# Patient Record
Sex: Male | Born: 1935 | Race: Black or African American | Hispanic: No | Marital: Married | Smoking: Never smoker
Health system: Southern US, Community
[De-identification: ages and names within clinical notes are randomized; demographics above are authoritative.]

## PROBLEM LIST (undated history)

## (undated) DIAGNOSIS — J90 Pleural effusion, not elsewhere classified: Principal | ICD-10-CM

## (undated) DIAGNOSIS — I5022 Chronic systolic (congestive) heart failure: Secondary | ICD-10-CM

## (undated) DIAGNOSIS — R269 Unspecified abnormalities of gait and mobility: Secondary | ICD-10-CM

## (undated) DIAGNOSIS — R0602 Shortness of breath: Secondary | ICD-10-CM

## (undated) DIAGNOSIS — I493 Ventricular premature depolarization: Secondary | ICD-10-CM

## (undated) DIAGNOSIS — N183 Chronic kidney disease, stage 3 unspecified: Secondary | ICD-10-CM

## (undated) DIAGNOSIS — I4892 Unspecified atrial flutter: Secondary | ICD-10-CM

## (undated) DIAGNOSIS — I4891 Unspecified atrial fibrillation: Secondary | ICD-10-CM

## (undated) DIAGNOSIS — I5021 Acute systolic (congestive) heart failure: Secondary | ICD-10-CM

## (undated) DIAGNOSIS — J9600 Acute respiratory failure, unspecified whether with hypoxia or hypercapnia: Secondary | ICD-10-CM

## (undated) DIAGNOSIS — C8590 Non-Hodgkin lymphoma, unspecified, unspecified site: Secondary | ICD-10-CM

## (undated) DIAGNOSIS — I2510 Atherosclerotic heart disease of native coronary artery without angina pectoris: Secondary | ICD-10-CM

## (undated) DIAGNOSIS — I255 Ischemic cardiomyopathy: Secondary | ICD-10-CM

## (undated) DIAGNOSIS — I351 Nonrheumatic aortic (valve) insufficiency: Secondary | ICD-10-CM

## (undated) DIAGNOSIS — N1330 Unspecified hydronephrosis: Secondary | ICD-10-CM

## (undated) DIAGNOSIS — I340 Nonrheumatic mitral (valve) insufficiency: Secondary | ICD-10-CM

## (undated) DIAGNOSIS — J189 Pneumonia, unspecified organism: Secondary | ICD-10-CM

## (undated) DIAGNOSIS — I350 Nonrheumatic aortic (valve) stenosis: Secondary | ICD-10-CM

## (undated) DIAGNOSIS — J869 Pyothorax without fistula: Secondary | ICD-10-CM

## (undated) DIAGNOSIS — N400 Enlarged prostate without lower urinary tract symptoms: Secondary | ICD-10-CM

## (undated) DIAGNOSIS — E43 Unspecified severe protein-calorie malnutrition: Secondary | ICD-10-CM

## (undated) DIAGNOSIS — I471 Supraventricular tachycardia, unspecified: Secondary | ICD-10-CM

## (undated) DIAGNOSIS — D649 Anemia, unspecified: Secondary | ICD-10-CM

## (undated) DIAGNOSIS — M109 Gout, unspecified: Secondary | ICD-10-CM

## (undated) HISTORY — DX: Chronic kidney disease, stage 3 (moderate): N18.3

## (undated) HISTORY — PX: COLONOSCOPY W/ BIOPSIES AND POLYPECTOMY: SHX1376

## (undated) HISTORY — DX: Non-Hodgkin lymphoma, unspecified, unspecified site: C85.90

## (undated) HISTORY — DX: Acute systolic (congestive) heart failure: I50.21

## (undated) HISTORY — DX: Unspecified severe protein-calorie malnutrition: E43

## (undated) HISTORY — DX: Anemia, unspecified: D64.9

## (undated) HISTORY — DX: Pneumonia, unspecified organism: J18.9

## (undated) HISTORY — PX: TONSILLECTOMY: SUR1361

## (undated) HISTORY — DX: Chronic kidney disease, stage 3 unspecified: N18.30

## (undated) HISTORY — DX: Pleural effusion, not elsewhere classified: J90

## (undated) HISTORY — DX: Atherosclerotic heart disease of native coronary artery without angina pectoris: I25.10

## (undated) HISTORY — DX: Nonrheumatic aortic (valve) insufficiency: I35.1

## (undated) HISTORY — DX: Unspecified atrial fibrillation: I48.91

## (undated) HISTORY — PX: LYMPH NODE DISSECTION: SHX5087

## (undated) HISTORY — DX: Pyothorax without fistula: J86.9

## (undated) HISTORY — DX: Acute respiratory failure, unspecified whether with hypoxia or hypercapnia: J96.00

## (undated) HISTORY — DX: Unspecified atrial flutter: I48.92

## (undated) HISTORY — DX: Chronic systolic (congestive) heart failure: I50.22

## (undated) HISTORY — PX: INGUINAL HERNIA REPAIR: SUR1180

## (undated) HISTORY — DX: Gout, unspecified: M10.9

## (undated) HISTORY — DX: Ischemic cardiomyopathy: I25.5

## (undated) HISTORY — DX: Ventricular premature depolarization: I49.3

---

## 2000-10-30 ENCOUNTER — Encounter (INDEPENDENT_AMBULATORY_CARE_PROVIDER_SITE_OTHER): Payer: Self-pay | Admitting: *Deleted

## 2000-10-30 ENCOUNTER — Encounter (INDEPENDENT_AMBULATORY_CARE_PROVIDER_SITE_OTHER): Payer: Self-pay | Admitting: Specialist

## 2000-10-31 ENCOUNTER — Encounter: Payer: Self-pay | Admitting: Internal Medicine

## 2000-10-31 ENCOUNTER — Inpatient Hospital Stay (HOSPITAL_COMMUNITY): Admission: EM | Admit: 2000-10-31 | Discharge: 2000-11-03 | Payer: Self-pay | Admitting: Emergency Medicine

## 2000-10-31 ENCOUNTER — Encounter: Payer: Self-pay | Admitting: *Deleted

## 2000-11-01 ENCOUNTER — Encounter: Payer: Self-pay | Admitting: Internal Medicine

## 2000-11-02 ENCOUNTER — Encounter: Payer: Self-pay | Admitting: Internal Medicine

## 2000-11-11 ENCOUNTER — Emergency Department (HOSPITAL_COMMUNITY): Admission: EM | Admit: 2000-11-11 | Discharge: 2000-11-11 | Payer: Self-pay | Admitting: Internal Medicine

## 2000-11-17 ENCOUNTER — Encounter: Payer: Self-pay | Admitting: Neurosurgery

## 2000-11-21 ENCOUNTER — Inpatient Hospital Stay (HOSPITAL_COMMUNITY): Admission: RE | Admit: 2000-11-21 | Discharge: 2000-11-23 | Payer: Self-pay | Admitting: Neurosurgery

## 2000-11-21 ENCOUNTER — Encounter: Payer: Self-pay | Admitting: Neurosurgery

## 2000-11-21 ENCOUNTER — Encounter (INDEPENDENT_AMBULATORY_CARE_PROVIDER_SITE_OTHER): Payer: Self-pay | Admitting: *Deleted

## 2000-12-27 ENCOUNTER — Encounter: Payer: Self-pay | Admitting: Hematology & Oncology

## 2000-12-27 ENCOUNTER — Ambulatory Visit (HOSPITAL_COMMUNITY): Admission: RE | Admit: 2000-12-27 | Discharge: 2000-12-27 | Payer: Self-pay | Admitting: Hematology & Oncology

## 2002-01-21 ENCOUNTER — Encounter: Payer: Self-pay | Admitting: Emergency Medicine

## 2002-01-22 ENCOUNTER — Encounter: Payer: Self-pay | Admitting: Internal Medicine

## 2002-01-22 ENCOUNTER — Inpatient Hospital Stay (HOSPITAL_COMMUNITY): Admission: EM | Admit: 2002-01-22 | Discharge: 2002-01-25 | Payer: Self-pay | Admitting: Emergency Medicine

## 2002-01-22 ENCOUNTER — Encounter (INDEPENDENT_AMBULATORY_CARE_PROVIDER_SITE_OTHER): Payer: Self-pay | Admitting: Specialist

## 2002-01-23 ENCOUNTER — Encounter: Payer: Self-pay | Admitting: Internal Medicine

## 2002-01-24 ENCOUNTER — Encounter (INDEPENDENT_AMBULATORY_CARE_PROVIDER_SITE_OTHER): Payer: Self-pay | Admitting: Specialist

## 2002-01-24 ENCOUNTER — Encounter: Payer: Self-pay | Admitting: Internal Medicine

## 2002-01-30 ENCOUNTER — Ambulatory Visit (HOSPITAL_COMMUNITY): Admission: RE | Admit: 2002-01-30 | Discharge: 2002-01-30 | Payer: Self-pay | Admitting: Hematology & Oncology

## 2002-01-30 ENCOUNTER — Encounter (INDEPENDENT_AMBULATORY_CARE_PROVIDER_SITE_OTHER): Payer: Self-pay

## 2002-02-05 ENCOUNTER — Encounter: Payer: Self-pay | Admitting: Emergency Medicine

## 2002-02-05 ENCOUNTER — Inpatient Hospital Stay (HOSPITAL_COMMUNITY): Admission: EM | Admit: 2002-02-05 | Discharge: 2002-02-07 | Payer: Self-pay | Admitting: Emergency Medicine

## 2002-02-06 ENCOUNTER — Encounter: Payer: Self-pay | Admitting: Hematology & Oncology

## 2002-02-26 ENCOUNTER — Emergency Department (HOSPITAL_COMMUNITY): Admission: EM | Admit: 2002-02-26 | Discharge: 2002-02-26 | Payer: Self-pay | Admitting: Emergency Medicine

## 2002-02-27 ENCOUNTER — Emergency Department (HOSPITAL_COMMUNITY): Admission: EM | Admit: 2002-02-27 | Discharge: 2002-02-27 | Payer: Self-pay | Admitting: Emergency Medicine

## 2002-04-08 ENCOUNTER — Emergency Department (HOSPITAL_COMMUNITY): Admission: EM | Admit: 2002-04-08 | Discharge: 2002-04-08 | Payer: Self-pay | Admitting: Emergency Medicine

## 2002-04-08 ENCOUNTER — Encounter: Payer: Self-pay | Admitting: Emergency Medicine

## 2002-04-09 ENCOUNTER — Emergency Department (HOSPITAL_COMMUNITY): Admission: EM | Admit: 2002-04-09 | Discharge: 2002-04-09 | Payer: Self-pay

## 2002-04-17 ENCOUNTER — Ambulatory Visit (HOSPITAL_COMMUNITY): Admission: RE | Admit: 2002-04-17 | Discharge: 2002-04-17 | Payer: Self-pay | Admitting: Hematology & Oncology

## 2002-04-17 ENCOUNTER — Encounter: Payer: Self-pay | Admitting: Hematology & Oncology

## 2002-04-18 ENCOUNTER — Emergency Department (HOSPITAL_COMMUNITY): Admission: EM | Admit: 2002-04-18 | Discharge: 2002-04-19 | Payer: Self-pay | Admitting: Emergency Medicine

## 2002-06-07 ENCOUNTER — Encounter: Payer: Self-pay | Admitting: Hematology & Oncology

## 2002-06-07 ENCOUNTER — Ambulatory Visit (HOSPITAL_COMMUNITY): Admission: RE | Admit: 2002-06-07 | Discharge: 2002-06-07 | Payer: Self-pay | Admitting: Hematology & Oncology

## 2002-07-04 ENCOUNTER — Encounter: Payer: Self-pay | Admitting: Hematology & Oncology

## 2002-07-04 ENCOUNTER — Ambulatory Visit (HOSPITAL_COMMUNITY): Admission: RE | Admit: 2002-07-04 | Discharge: 2002-07-04 | Payer: Self-pay | Admitting: Hematology & Oncology

## 2002-09-11 ENCOUNTER — Ambulatory Visit (HOSPITAL_COMMUNITY): Admission: RE | Admit: 2002-09-11 | Discharge: 2002-09-11 | Payer: Self-pay | Admitting: Hematology & Oncology

## 2002-09-11 ENCOUNTER — Encounter: Payer: Self-pay | Admitting: Hematology & Oncology

## 2002-12-18 ENCOUNTER — Ambulatory Visit (HOSPITAL_COMMUNITY): Admission: RE | Admit: 2002-12-18 | Discharge: 2002-12-18 | Payer: Self-pay | Admitting: Hematology & Oncology

## 2002-12-18 ENCOUNTER — Encounter: Payer: Self-pay | Admitting: Hematology & Oncology

## 2003-03-20 ENCOUNTER — Ambulatory Visit (HOSPITAL_COMMUNITY): Admission: RE | Admit: 2003-03-20 | Discharge: 2003-03-20 | Payer: Self-pay | Admitting: Hematology & Oncology

## 2003-04-08 ENCOUNTER — Ambulatory Visit (HOSPITAL_COMMUNITY): Admission: RE | Admit: 2003-04-08 | Discharge: 2003-04-08 | Payer: Self-pay

## 2003-06-16 ENCOUNTER — Ambulatory Visit (HOSPITAL_COMMUNITY): Admission: RE | Admit: 2003-06-16 | Discharge: 2003-06-16 | Payer: Self-pay | Admitting: Hematology & Oncology

## 2003-09-02 ENCOUNTER — Ambulatory Visit (HOSPITAL_COMMUNITY): Admission: RE | Admit: 2003-09-02 | Discharge: 2003-09-02 | Payer: Self-pay | Admitting: Hematology & Oncology

## 2003-11-21 ENCOUNTER — Emergency Department (HOSPITAL_COMMUNITY): Admission: EM | Admit: 2003-11-21 | Discharge: 2003-11-21 | Payer: Self-pay | Admitting: Emergency Medicine

## 2003-12-26 ENCOUNTER — Ambulatory Visit (HOSPITAL_COMMUNITY): Admission: RE | Admit: 2003-12-26 | Discharge: 2003-12-26 | Payer: Self-pay | Admitting: Hematology & Oncology

## 2004-02-05 ENCOUNTER — Ambulatory Visit: Payer: Self-pay | Admitting: Hematology & Oncology

## 2004-04-30 ENCOUNTER — Ambulatory Visit (HOSPITAL_COMMUNITY): Admission: RE | Admit: 2004-04-30 | Discharge: 2004-04-30 | Payer: Self-pay | Admitting: Hematology & Oncology

## 2004-05-06 ENCOUNTER — Ambulatory Visit: Payer: Self-pay | Admitting: Hematology & Oncology

## 2004-08-25 ENCOUNTER — Ambulatory Visit: Payer: Self-pay | Admitting: Hematology & Oncology

## 2004-09-29 ENCOUNTER — Ambulatory Visit (HOSPITAL_COMMUNITY): Admission: RE | Admit: 2004-09-29 | Discharge: 2004-09-29 | Payer: Self-pay | Admitting: Hematology & Oncology

## 2005-03-28 ENCOUNTER — Ambulatory Visit: Payer: Self-pay | Admitting: Hematology & Oncology

## 2005-03-31 ENCOUNTER — Ambulatory Visit (HOSPITAL_COMMUNITY): Admission: RE | Admit: 2005-03-31 | Discharge: 2005-03-31 | Payer: Self-pay | Admitting: Hematology & Oncology

## 2005-09-27 ENCOUNTER — Ambulatory Visit: Payer: Self-pay | Admitting: Hematology & Oncology

## 2005-09-27 LAB — COMPREHENSIVE METABOLIC PANEL
AST: 14 U/L (ref 0–37)
Albumin: 4 g/dL (ref 3.5–5.2)
Alkaline Phosphatase: 86 U/L (ref 39–117)
Chloride: 106 mEq/L (ref 96–112)
Glucose, Bld: 142 mg/dL — ABNORMAL HIGH (ref 70–99)
Potassium: 4.5 mEq/L (ref 3.5–5.3)
Sodium: 140 mEq/L (ref 135–145)
Total Protein: 6.9 g/dL (ref 6.0–8.3)

## 2005-09-27 LAB — CBC WITH DIFFERENTIAL/PLATELET
Basophils Absolute: 0 10*3/uL (ref 0.0–0.1)
EOS%: 2.5 % (ref 0.0–7.0)
HCT: 33.5 % — ABNORMAL LOW (ref 38.7–49.9)
HGB: 11.3 g/dL — ABNORMAL LOW (ref 13.0–17.1)
LYMPH%: 16.2 % (ref 14.0–48.0)
MCH: 29.9 pg (ref 28.0–33.4)
MCV: 88.9 fL (ref 81.6–98.0)
MONO%: 8.4 % (ref 0.0–13.0)
NEUT%: 72.5 % (ref 40.0–75.0)
Platelets: 199 10*3/uL (ref 145–400)
lymph#: 0.5 10*3/uL — ABNORMAL LOW (ref 0.9–3.3)

## 2005-09-28 ENCOUNTER — Ambulatory Visit (HOSPITAL_COMMUNITY): Admission: RE | Admit: 2005-09-28 | Discharge: 2005-09-28 | Payer: Self-pay | Admitting: Hematology & Oncology

## 2005-10-26 LAB — CBC WITH DIFFERENTIAL/PLATELET
BASO%: 0.3 % (ref 0.0–2.0)
Eosinophils Absolute: 0 10*3/uL (ref 0.0–0.5)
HCT: 36.8 % — ABNORMAL LOW (ref 38.7–49.9)
MCHC: 32.8 g/dL (ref 32.0–35.9)
MONO#: 0.2 10*3/uL (ref 0.1–0.9)
NEUT#: 1.8 10*3/uL (ref 1.5–6.5)
NEUT%: 71.1 % (ref 40.0–75.0)
RBC: 3.98 10*6/uL — ABNORMAL LOW (ref 4.20–5.71)
WBC: 2.6 10*3/uL — ABNORMAL LOW (ref 4.0–10.0)
lymph#: 0.5 10*3/uL — ABNORMAL LOW (ref 0.9–3.3)

## 2005-11-14 ENCOUNTER — Ambulatory Visit: Payer: Self-pay | Admitting: Hematology & Oncology

## 2005-11-16 LAB — CBC WITH DIFFERENTIAL/PLATELET
Basophils Absolute: 0 10*3/uL (ref 0.0–0.1)
Eosinophils Absolute: 0 10*3/uL (ref 0.0–0.5)
HCT: 35 % — ABNORMAL LOW (ref 38.7–49.9)
HGB: 11.6 g/dL — ABNORMAL LOW (ref 13.0–17.1)
MONO#: 0.2 10*3/uL (ref 0.1–0.9)
NEUT%: 68 % (ref 40.0–75.0)
Platelets: 130 10*3/uL — ABNORMAL LOW (ref 145–400)
WBC: 2.5 10*3/uL — ABNORMAL LOW (ref 4.0–10.0)
lymph#: 0.6 10*3/uL — ABNORMAL LOW (ref 0.9–3.3)

## 2005-12-26 ENCOUNTER — Ambulatory Visit: Payer: Self-pay | Admitting: Hematology & Oncology

## 2006-03-27 ENCOUNTER — Ambulatory Visit (HOSPITAL_COMMUNITY): Admission: RE | Admit: 2006-03-27 | Discharge: 2006-03-27 | Payer: Self-pay | Admitting: Hematology & Oncology

## 2006-04-21 ENCOUNTER — Ambulatory Visit: Payer: Self-pay | Admitting: Hematology & Oncology

## 2006-04-26 LAB — COMPREHENSIVE METABOLIC PANEL
ALT: 17 U/L (ref 0–53)
Alkaline Phosphatase: 74 U/L (ref 39–117)
Sodium: 143 mEq/L (ref 135–145)
Total Bilirubin: 1.3 mg/dL — ABNORMAL HIGH (ref 0.3–1.2)
Total Protein: 6.5 g/dL (ref 6.0–8.3)

## 2006-04-26 LAB — CBC WITH DIFFERENTIAL/PLATELET
BASO%: 0.2 % (ref 0.0–2.0)
Basophils Absolute: 0 10*3/uL (ref 0.0–0.1)
EOS%: 1.4 % (ref 0.0–7.0)
Eosinophils Absolute: 0 10*3/uL (ref 0.0–0.5)
HCT: 29.4 % — ABNORMAL LOW (ref 38.7–49.9)
HGB: 9.9 g/dL — ABNORMAL LOW (ref 13.0–17.1)
LYMPH%: 21.9 % (ref 14.0–48.0)
MCH: 30.2 pg (ref 28.0–33.4)
MCHC: 33.7 g/dL (ref 32.0–35.9)
MCV: 89.7 fL (ref 81.6–98.0)
MONO#: 0.2 10*3/uL (ref 0.1–0.9)
MONO%: 8.7 % (ref 0.0–13.0)
NEUT#: 1.6 10*3/uL (ref 1.5–6.5)
NEUT%: 67.8 % (ref 40.0–75.0)
Platelets: 114 10*3/uL — ABNORMAL LOW (ref 145–400)
RBC: 3.27 10*6/uL — ABNORMAL LOW (ref 4.20–5.71)
RDW: 15.7 % — ABNORMAL HIGH (ref 11.2–14.6)
WBC: 2.3 10*3/uL — ABNORMAL LOW (ref 4.0–10.0)
lymph#: 0.5 10*3/uL — ABNORMAL LOW (ref 0.9–3.3)

## 2006-06-19 ENCOUNTER — Ambulatory Visit: Payer: Self-pay | Admitting: Hematology & Oncology

## 2007-12-06 ENCOUNTER — Encounter: Admission: RE | Admit: 2007-12-06 | Discharge: 2007-12-06 | Payer: Self-pay | Admitting: Internal Medicine

## 2009-12-22 ENCOUNTER — Inpatient Hospital Stay (HOSPITAL_COMMUNITY): Admission: EM | Admit: 2009-12-22 | Discharge: 2009-12-23 | Payer: Self-pay | Admitting: Emergency Medicine

## 2009-12-25 ENCOUNTER — Emergency Department (HOSPITAL_COMMUNITY): Admission: EM | Admit: 2009-12-25 | Discharge: 2009-12-26 | Payer: Self-pay | Admitting: Emergency Medicine

## 2010-04-01 ENCOUNTER — Ambulatory Visit: Payer: Self-pay | Admitting: Cardiology

## 2010-04-05 ENCOUNTER — Ambulatory Visit (HOSPITAL_COMMUNITY)
Admission: RE | Admit: 2010-04-05 | Discharge: 2010-04-05 | Payer: Self-pay | Source: Home / Self Care | Attending: Cardiology | Admitting: Cardiology

## 2010-04-05 ENCOUNTER — Ambulatory Visit: Admission: RE | Admit: 2010-04-05 | Discharge: 2010-04-05 | Payer: Self-pay | Source: Home / Self Care

## 2010-04-05 ENCOUNTER — Encounter: Payer: Self-pay | Admitting: Cardiology

## 2010-04-15 ENCOUNTER — Ambulatory Visit: Payer: Self-pay | Admitting: Cardiology

## 2010-06-02 LAB — BASIC METABOLIC PANEL
CO2: 22 mEq/L (ref 19–32)
Calcium: 8.8 mg/dL (ref 8.4–10.5)
Calcium: 8.8 mg/dL (ref 8.4–10.5)
Chloride: 107 mEq/L (ref 96–112)
Creatinine, Ser: 1.77 mg/dL — ABNORMAL HIGH (ref 0.4–1.5)
Creatinine, Ser: 1.77 mg/dL — ABNORMAL HIGH (ref 0.4–1.5)
GFR calc Af Amer: 46 mL/min — ABNORMAL LOW (ref >60)
GFR calc Af Amer: 46 mL/min — ABNORMAL LOW (ref >60)
Glucose, Bld: 84 mg/dL (ref 70–99)
Sodium: 131 mEq/L — ABNORMAL LOW (ref 135–145)
Sodium: 136 mEq/L (ref 135–145)

## 2010-06-02 LAB — COMPREHENSIVE METABOLIC PANEL
ALT: 17 U/L (ref 0–53)
BUN: 26 mg/dL — ABNORMAL HIGH (ref 6–23)
CO2: 24 mEq/L (ref 19–32)
Calcium: 8.4 mg/dL (ref 8.4–10.5)
GFR calc non Af Amer: 36 mL/min — ABNORMAL LOW (ref >60)
Glucose, Bld: 78 mg/dL (ref 70–99)
Sodium: 133 mEq/L — ABNORMAL LOW (ref 135–145)
Total Protein: 5.4 g/dL — ABNORMAL LOW (ref 6.0–8.3)

## 2010-06-02 LAB — MAGNESIUM: Magnesium: 2 mg/dL (ref 1.5–2.5)

## 2010-06-02 LAB — CULTURE, BLOOD (ROUTINE X 2)
Culture  Setup Time: 201110050004
Culture  Setup Time: 201110050005
Culture: NO GROWTH
Culture: NO GROWTH

## 2010-06-02 LAB — PROTEIN ELECTROPHORESIS, SERUM
Alpha-1-Globulin: 6.5 % — ABNORMAL HIGH (ref 2.9–4.9)
Beta 2: 4.8 % (ref 3.2–6.5)
Beta Globulin: 4.8 % (ref 4.7–7.2)
Gamma Globulin: 16.2 % (ref 11.1–18.8)
M-Spike, %: NOT DETECTED g/dL
Total Protein ELP: 6.3 g/dL (ref 6.0–8.3)

## 2010-06-02 LAB — BLOOD GAS, ARTERIAL
Bicarbonate: 20.8 mEq/L (ref 20.0–24.0)
Drawn by: 326301
FIO2: 0.21 %
O2 Saturation: 98.7 %
Patient temperature: 98.6
pH, Arterial: 7.405 (ref 7.350–7.450)

## 2010-06-02 LAB — CBC
HCT: 26.6 % — ABNORMAL LOW (ref 39.0–52.0)
Hemoglobin: 9.1 g/dL — ABNORMAL LOW (ref 13.0–17.0)
Hemoglobin: 9.7 g/dL — ABNORMAL LOW (ref 13.0–17.0)
MCH: 30.2 pg (ref 26.0–34.0)
MCH: 31.3 pg (ref 26.0–34.0)
MCHC: 34.1 g/dL (ref 30.0–36.0)
MCV: 90.6 fL (ref 78.0–100.0)
MCV: 91.9 fL (ref 78.0–100.0)
RBC: 2.89 MIL/uL — ABNORMAL LOW (ref 4.22–5.81)
RBC: 3.21 MIL/uL — ABNORMAL LOW (ref 4.22–5.81)
RDW: 13.1 % (ref 11.5–15.5)
WBC: 1.9 10*3/uL — ABNORMAL LOW (ref 4.0–10.5)

## 2010-06-02 LAB — RENAL FUNCTION PANEL
BUN: 23 mg/dL (ref 6–23)
CO2: 22 mEq/L (ref 19–32)
Calcium: 8.2 mg/dL — ABNORMAL LOW (ref 8.4–10.5)
Chloride: 105 mEq/L (ref 96–112)
Creatinine, Ser: 1.63 mg/dL — ABNORMAL HIGH (ref 0.4–1.5)
GFR calc Af Amer: 50 mL/min — ABNORMAL LOW (ref >60)
GFR calc non Af Amer: 42 mL/min — ABNORMAL LOW (ref >60)
Glucose, Bld: 80 mg/dL (ref 70–99)

## 2010-06-02 LAB — CARDIAC PANEL(CRET KIN+CKTOT+MB+TROPI)
CK, MB: 1.3 ng/mL (ref 0.3–4.0)
Relative Index: 1.1 (ref 0.0–2.5)
Relative Index: 1.8 (ref 0.0–2.5)
Troponin I: 0.02 ng/mL (ref 0.00–0.06)

## 2010-06-02 LAB — DIFFERENTIAL
Basophils Absolute: 0 10*3/uL (ref 0.0–0.1)
Eosinophils Absolute: 0 10*3/uL (ref 0.0–0.7)
Lymphocytes Relative: 10 % — ABNORMAL LOW (ref 12–46)
Monocytes Absolute: 0.2 10*3/uL (ref 0.1–1.0)
Neutro Abs: 1.5 10*3/uL — ABNORMAL LOW (ref 1.7–7.7)

## 2010-06-02 LAB — BODY FLUID CULTURE: Culture: NO GROWTH

## 2010-06-02 LAB — PATHOLOGIST SMEAR REVIEW

## 2010-06-02 LAB — SYNOVIAL CELL COUNT + DIFF, W/ CRYSTALS
Eosinophils-Synovial: 0 % (ref 0–1)
Neutrophil, Synovial: 65 % — ABNORMAL HIGH (ref 0–25)
WBC, Synovial: 295 /mm3 — ABNORMAL HIGH (ref 0–200)

## 2010-06-02 LAB — GRAM STAIN

## 2010-06-02 LAB — POCT CARDIAC MARKERS
CKMB, poc: 1.1 ng/mL (ref 1.0–8.0)
Myoglobin, poc: 141 ng/mL (ref 12–200)
Troponin i, poc: <0.05 ng/mL (ref 0.00–0.09)

## 2010-06-02 LAB — PROTIME-INR: INR: 1.17 (ref 0.00–1.49)

## 2010-06-02 LAB — POTASSIUM: Potassium: 6.2 mEq/L — ABNORMAL HIGH (ref 3.5–5.1)

## 2010-06-02 LAB — APTT: aPTT: 27 seconds (ref 24–37)

## 2010-06-30 ENCOUNTER — Encounter: Payer: Self-pay | Admitting: Cardiology

## 2010-07-09 ENCOUNTER — Other Ambulatory Visit: Payer: Self-pay | Admitting: Urology

## 2010-07-09 DIAGNOSIS — N1330 Unspecified hydronephrosis: Secondary | ICD-10-CM

## 2010-07-14 ENCOUNTER — Encounter (HOSPITAL_COMMUNITY)
Admission: RE | Admit: 2010-07-14 | Discharge: 2010-07-14 | Disposition: A | Discharge status: Home / Self Care | Payer: MEDICARE | Source: Ambulatory Visit | Attending: Urology | Admitting: Urology

## 2010-07-14 ENCOUNTER — Encounter (HOSPITAL_COMMUNITY): Payer: Self-pay

## 2010-07-14 DIAGNOSIS — N1330 Unspecified hydronephrosis: Secondary | ICD-10-CM | POA: Insufficient documentation

## 2010-07-14 MED ORDER — TECHNETIUM TC 99M MERTIATIDE: 15.3000 | Freq: Once | INTRAVENOUS | Status: AC | PRN

## 2010-08-06 NOTE — Discharge Summary (Signed)
[name], [name]                          ACCOUNT NO.:  [id number]   MEDICAL RECORD NO.:  [id number]                   PATIENT TYPE:  OUT   LOCATION:  OMED                                 FACILITY:  [facility]   PHYSICIAN:  [name] Quarry, [name]                   DATE OF BIRTH:  [birth year]   DATE OF ADMISSION:  01/30/2002  DATE OF DISCHARGE:  01/30/2002                                 DISCHARGE SUMMARY   HISTORY OF PRESENT ILLNESS:  The patient is a 76 year old man who has been  followed over a long period by Dr. Rose Phi. Ennever.  Dr. Rose Phi. Ennever  initially saw him one year ago at which time he was found to have a  paraspinal mass.  He underwent a biopsy of the left axillary lymph node and  no definitive diagnosis could be made.  A transthoracic biopsy of the  paraspinal mass also did not make any definitive diagnosis.  The patient did  not wish to have any further evaluation at that time.   He was then apparently lost to follow up.  About one year later, he  presented to [facility] with a one-week history of increase shortness of  breath.  A chest x-ray was done which showed a large pleural effusion.  Therefore, the patient was admitted to the hospital by Dr. Amada Kingfisher-  Bonsu.  The physical exam at the time of admission was remarkable chiefly  for diminished breath sounds on the left side about one-half of the way up.  Cardiovascular exam revealed a regular rate and rhythm without rubs,  murmurs, or gallops.  Also noteworthy, was a large inguinal lymph node  measuring about 0.5 to 1 cm.   Subsequent evaluation performed included a CT scan of the chest which showed  a large left pleural effusion as well as increasing abnormal soft tissue in  the posterior mediastinum extending into the retrocrural region.  The  thoracic aorta was now uplifted off the thoracic spine.  Increase in soft  tissue around the left pulmonary vein was also noted.  These findings were  thought to be  secondary to a lymphoma.  MRI scan of the thoracic spine was  carried out which was felt to show lymphomatous involvement of vertebrae,  most notably T8, T9, and T12.   Laboratory studies obtained included a CBC which revealed a white count of  270, hemoglobin of 11.2, hematocrit 34.  PT and PTT were within normal  limits.  The BMET revealed a BUN of 39, creatinine of 1.4.  Liver profile  was normal.  A thoracentesis was performed.  This revealed a pleural LDH of  55, protein of 4.4, no organisms were identified.   As the patient remained a diagnostic dilemma, general surgical consultation  was obtained and on January 24, 2002 the patient underwent a left inguinal  lymph node resection by Dr.  Payton Doughty.  Subsequent to the patient's  discharge, this study was reported out as showing findings of a non-  Hodgkin's lymphoma, large-cell, follicular center-type grade 3.  Flow  cytometry showed a monotypic D cell population.   The patient was actually discharged on January 25, 2002 prior to receiving  back the results of the pathology.  The plan at that time was for the  patient to follow up with Dr. Rose Phi. Ennever to discuss the pathology  findings.    DISCHARGE DIAGNOSES:  1. Large left pleural effusion, possibly malignant.  2. Abnormal lymph node in groin, status post biopsy with subsequent finding     of non-Hodgkin lymphoma.                                               [name] Quarry, [name]    SY/MEDQ  D:  02/13/2002  T:  02/14/2002  Job:  [id number]   cc:   Jamison Neighbor, M.D.  [address]  [place]  [place] [address]  Fax: [phone]   Payton Doughty, M.D.  [address]  [place]  [place] [address]  Fax: [phone]

## 2010-08-06 NOTE — Discharge Summary (Signed)
[name], [name]                          ACCOUNT NO.:  [id number]   MEDICAL RECORD NO.:  [id number]                   PATIENT TYPE:  INP   LOCATION:  0359                                 FACILITY:  [facility]   PHYSICIAN:  Rose Phi. Myna Hidalgo, M.D.              DATE OF BIRTH:  03/30/1934   DATE OF ADMISSION:  02/05/2002  DATE OF DISCHARGE:  02/07/2002                                 DISCHARGE SUMMARY   DISCHARGE DIAGNOSES:  1. Recurrent left pleural effusion.  2. Status post thoracentesis.  3. Non-Hodgkin's lymphoma.  4. Hypertension.  5. Reactive tachycardia.   CONDITION ON DISCHARGE:  Stable.   ACTIVITY:  As tolerated.   DIET:  No restrictions.   FOLLOW UP:  The patient will follow up with Dr. Myna Hidalgo on February 22, 2002.   DISCHARGE MEDICATIONS:  Hyzaar 50/12.5 one p.o. q.d.   ADMISSION HISTORY AND PHYSICAL:  As dictated in admit note.  Please see that  for full details.   HOSPITAL COURSE:  The patient was admitted from the emergency room.  He came  in with increasing shortness of breath.  He had tachycardia with a heart  rate of 140.  EKG showed sinus tachycardia.  Chest x-ray on admission showed  recurrent left pleural effusion.   He was admitted to monitored floor.  He ruled out for cardiac  ischemia/infarction.  He was not neutropenic.  Hemoglobin was 10.3.  Platelet count was 104,000.   He underwent a thoracentesis on February 06, 2002.  1750 cc of fluid was  removed.  He felt better immediately afterwards.   While on the cardiac floor his rate was normal sinus rhythm.  He had  occasional PVC.   There was question of pneumonia in his left base.  However, post  thoracentesis this just showed some atelectasis.   He was on Tequin prophylactically.  He was febrile during his hospital  course.   On the morning of the 20th I thought he was stable for discharge.  He felt  he was able to go home.   We took him off his monitor on the 19th.  He was able to walk  around without  any difficulties.  We stopped his IV fluids.   PHYSICAL EXAMINATION:  GENERAL:  Upon discharge his physical examination  showed a well-developed, well-nourished black gentleman in no obvious  distress  VITAL SIGNS:  Temperature 98.4, pulse 78, respiratory rate 20, blood  pressure 108/64, oxygen saturation 99%.  LUNGS:  Clear in the right lung field.  Had much better breath sounds in the  left lung field.  CARDIAC:  Regular rate and rhythm with no murmurs, rubs, or bruits.  ABDOMEN:  Soft with good bowel sounds.  No palpable abdominal mass.  No  palpable hepatosplenomegaly.  Rose Phi. Myna Hidalgo, M.D.    PRE/MEDQ  D:  02/07/2002  T:  02/07/2002  Job:  [id number]   cc:   Rose Phi. Myna Hidalgo, M.D.  [address] [facility]  [place], [place] [address]  Fax: [phone]

## 2010-08-06 NOTE — Op Note (Signed)
[name], [name]                          ACCOUNT NO.:  [id number]   MEDICAL RECORD NO.:  [id number]                   PATIENT TYPE:  AMB   LOCATION:  DAY                                  FACILITY:  [facility]   PHYSICIAN:  Lorre Munroe., M.D.            DATE OF BIRTH:  [birth year]   DATE OF PROCEDURE:  04/08/2003  DATE OF DISCHARGE:                                 OPERATIVE REPORT   PREOPERATIVE DIAGNOSIS:  Indirect right inguinal hernia.   POSTOPERATIVE DIAGNOSIS:  Indirect right inguinal hernia.   OPERATION:  Repair of right inguinal hernia.   SURGEON:  Lebron Conners, M.D.   ANESTHESIA:  Local with sedation.   DESCRIPTION OF PROCEDURE:  After the patient was monitored and sedated and  had routine preparation and draping of the right inguinal region, I  liberally infused local anesthetic in the region, skin, subcutaneous tissues  and deeper tissues especially laterally blocking the ilioinguinal nerve. I  made an oblique skin incision beginning just above and lateral to the pubic  tubercle about 5 cm in length and dissected down through the subcutaneous  tissues until I encountered the external oblique. I opened the external  oblique in the direction of the fibers into the superficial inguinal ring  and exposed the spermatic cord taking care to avoid injury to the  ilioinguinal nerve. I also identified and protected the iliohypogastric  nerve.  I added further local anesthetic and then mobilized the spermatic  cord and found that the medial inguinal floor was solid. I dissected the  proximal portion of the cord and dissected up a large indirect hernia sac,  separated that from the vas deferens and other structures of the spermatic  cord.  I reduced that through the deep inguinal ring and held that in place  with a plug of polypropylene mesh held in place with a 2-0 silk suture.  I  then fashioned a patch of the polypropylene mesh to fit the inguinal floor  making a slit in  it to allow the spermatic cord to exit into the scrotum.  I  then sutured that in from the pubic tubercle medially and superiorly with  running 2-0 Prolene and laterally and inferiorly with running 2-0 Prolene in  the shelving edge of the inguinal ligament.  I used a single suture to join  the tails of the mesh together lateral to the spermatic cord and felt that  repair was solid. Hemostasis was good.  I closed the external oblique and  subcutaneous tissues with separate layers of running 3-0 Vicryl suture and  closed the skin with intracuticular 4-0 Vicryl and Steri-Strips.  The  patient tolerated the procedure well.  Lorre Munroe., M.D.    Jodi Marble  D:  04/08/2003  T:  04/08/2003  Job:  [id number]

## 2010-08-06 NOTE — H&P (Signed)
[name] NO.:  [id number]   MEDICAL RECORD NO.:  [id number]                   PATIENT TYPE:  EMS   LOCATION:  MAJO                                 FACILITY:  [facility]   PHYSICIAN:  Jackie Plum, M.D.             DATE OF BIRTH:  Sep 16, 1934   DATE OF ADMISSION:  01/22/2002  DATE OF DISCHARGE:                                HISTORY & PHYSICAL   REASON FOR ADMISSION:  Shortness of breath x4 days, worsened by exertion  secondary to large left pleural effusion.  There was a  chest CT done on  January 21, 2002, notable for large left pleural effusion with small pleural  effusion, posterior mediastinal mass.   CHIEF COMPLAINT:  Shortness of breath x4 days.   HISTORY OF PRESENT ILLNESS:  The patient is a 76 year old African American  gentleman without any significant previous medical history who presents with  above complaints.  One year ago patient had been evaluated for probable  lymphoma and work-up was said to be inconclusive.  He had biopsy of left  paraspinous muscle, neural foramen and costal border in September 2003, by  Dr. Trey Sailors and by surgery report, was notable for questionable  lymphoproliferative disorder.  At that time it was thought that he had a  mediastinal lesion which had infiltrated the left paraspinous region from T8  to T9, T9 to T10, T10 to 11, and T11 to 12, with left greater than right.  He had a right lymph node biopsy which was said to be unremarkable prior to  this.  According to the patient, at that point, it was requested that he  have an open biopsy of a mediastinal mass but he refused.  He also indicated  that during follow-up as an outpatient by MRI, he was told that lesion had  disappeared.   According to the patient he had been in his usual state of health over the  last few months until four days ago when he had shortness of breath which  was worsened by exertion.  The severity was moderate and it was  associated  with mild nonproductive cough.  No history of chest pain.  No history of  fever, chills or night sweats.  He denies any history of dizziness.  No  significant GI symptomatology.  He admitted to having lost weight but  apparently this was intentional.   According to the patient over the last few months he has started to  experience some lower extremity mild edema for which he was prescribed water  pills by his primary care physician at [facility].   PAST MEDICAL HISTORY:  This is as noted in HPI, otherwise past medical  history was unremarkable.  The patient denies any prior history of heart  disease or lung disease.   MEDICATIONS:  The patient takes water pill for his lower extremity edema  which is of recent onset.  ALLERGIES:  No known drug allergies.   FAMILY HISTORY:  He denies family history of heart disease.   SOCIAL HISTORY:  The patient is married.  He has grandchildren.  He lives  with his wife and works for himself stocking shops with commodities.  He  denies smoking cigarettes or use of alcohol.   REVIEW OF SYSTEMS:  Significant positives and negatives as noted in HPI,  otherwise review of systems was unremarkable.   PHYSICAL EXAMINATION:  GENERAL APPEARANCE:  This is notable for an elderly  gentleman who lives quite hardy, sitting down on stretcher.  He was not in  acute cardiopulmonary distress.  VITAL SIGNS:  Blood pressure was 112/75, pulse 96 per minute, respiratory  rate of 20 per minute, temperature 97.5 degrees F.  O2 saturation was 98% on  two liters of oxygen by nasal cannula.  HEENT:  He was not pale.  He was not icteric.  The oropharynx was mildly  dry.  NECK:  Supple, no thyromegaly was appreciated.  LUNGS:  Reduced breath sounds on left lung field.  Percussion was dull.  CARDIOVASCULAR:  Regular rate and rhythm. There were no gallops.  ABDOMEN:  Full, bowel sounds present and normoactive.  There was no  tenderness.  There was no  hepatosplenomegaly appreciated.  BACK:  He had surgical scar around the area of his thoracic spine.  EXTREMITIES:  There was no clubbing.  He was no cyanotic.  No pedal edema  was appreciated.  He was alert and oriented x3.   LABORATORY DATA:  The white blood cell count was 3.3, hemoglobin 12.7,  platelets 177. Sodium 139, potassium 4.6, chloride 108, CO2 27, glucose 75,  BUN 39, creatinine 1.7.   Chest x-ray was notable of elevation of the left hemidiaphragm per ED  records.  Assay was reported from [facility].  Chest CT done at [facility] was notable for large left pleural effusion and posterior  mediastinal mass.   ASSESSMENT:  A 76 year old gentleman with prior inconclusive work-up for  lymphoma, apparently, with four-day history of shortness of breath which is  worsened by activity, no history of chest pain, palpitations or other  significant cardiac symptomatology.  Imaging studies are notable for large  pleural effusion with mediastinal mass.  The patient is well-known to the  oncology service.  The ED providers contacted Dr. Lyndal Pulley who is  oncology Hospitalist and had asked that [facility] admit the patient  and for the oncologist to consult.   PLAN:  Admit the patient to medical floor.  We will obtain thoracocentesis  and sent specimen for cytology and other necessary work-up. This will help  to relieve some of his present shortness of breath (though the patient does  not seem to be actively in distress at this point).  We will also obtain  more disciplinary conference between thoracic surgery, preferably Dr. Channing Mutters  who knows the patient, and Dr. Myna Hidalgo to decide on further work-up  regarding obtaining tissue biopsy of the above mentioned mediastinal mass.                                               Jackie Plum, M.D.   GO/MEDQ  D:  01/22/2002  T:  01/22/2002  Job:  [id number]   cc:   Rose Phi. Myna Hidalgo, M.D.  [address]   [place], [place] [address]  Fax: [phone]   Miquel Dunn. Catha Gosselin, M.D.  [address] [facility]  [place]  [place] [address]  Fax: [phone]   Payton Doughty, M.D.   Sherin Quarry, MD  [address]  [place]  [place] [address]  Fax: [phone]

## 2010-08-06 NOTE — Op Note (Signed)
[facility]  Patient:    [name], [name] Visit Number: [id number] MRN: [id number]          Service Type: Attending:  Luisa Hart L. Lurene Shadow, M.D. Proc. Date: 11/03/00                             Operative Report  PREOPERATIVE DIAGNOSIS:  Left axillary mass, rule out lymphoma.  POSTOPERATIVE DIAGNOSIS:  Left axillary mass, rule out lymphoma.  PROCEDURE:  Excisional biopsy, left axillary mass.  SURGEON:  Mardene Celeste. Lurene Shadow, M.D.  ASSISTANT:  Nurse.  ANESTHESIA:  General.  CLINICAL NOTE:  [name] is a 76 year old man who was incidentally found on CT scan to have a paraspinal tumor which on CT-guided biopsy showed lymphoid tissue consistent with a possible lymphoma.  Further workup shows that he has a left axillary mass.  He is brought to the operating room now for excision of this mass for better tissue diagnosis to rule out lymphoma.  DESCRIPTION OF PROCEDURE:  Following the induction of satisfactory anesthesia with the patient positioned supinely, the left axilla was prepped and draped to be included in the sterile operative field.  Transverse axillary incision was made, deepened through the skin and subcutaneous tissue, with dissection carried down to the mass.  The mass was grasped and dissected free from surrounding soft tissue and is primarily lipomatous with several smaller lymph nodes within the mass.  It was dissected, removed, and forwarded for pathologic evaluation.  Hemostasis was obtained with clamps and ties of 2-0 silk or with electrocautery.  Sponge, instrument, and sharp counts were verified.  Subcutaneous tissue was closed with running 2-0 Vicryl suture, skin closed with a running 4-0 Monocryl suture and then reinforced with Steri-Strips.  Sterile dressings applied.  Attention was then turned to the left groin, where a rash-like lesion was noted and felt possibly to be a cutaneous manifestation of lymphoma versus a fungal  infection.  This area was prepped and draped to be included in the sterile operative field.  I took a skin biopsy from this area full-thickness to the subcutaneous fat using an elliptical incision.  This was removed and forwarded for pathologic evaluation.  The bleeding points were treated with electrocautery, and the skin was closed with a running 4-0 Monocryl suture.  This was also reinforced with Steri-Strips and a sterile dressing applied.  Anesthetic reversed, patient removed from the operating room to the recovery room in stable condition.  He tolerated the procedure well. DD:  11/03/00 TD:  11/03/00 Job: [id number]

## 2010-08-06 NOTE — Op Note (Signed)
   NAMERHYLEN, [name]                          ACCOUNT NO.:  [id number]   MEDICAL RECORD NO.:  [id number]                   PATIENT TYPE:  INP   LOCATION:  4707                                 FACILITY:  [facility]   PHYSICIAN:  Leonie Man, M.D.                DATE OF BIRTH:  1934-08-25   DATE OF PROCEDURE:  01/24/2002  DATE OF DISCHARGE:                                 OPERATIVE REPORT   PREOPERATIVE DIAGNOSIS:  Left inguinal adenopathy, rule out lymphoma.   POSTOPERATIVE DIAGNOSIS:  Left inguinal adenopathy, rule out lymphoma.   OPERATION PERFORMED:  Excisional biopsy of left groin node.   SURGEON:  Leonie Man, M.D.   ASSISTANT:  Nurse.   ANESTHESIA:  General.   INDICATIONS FOR PROCEDURE:  The patient is a 76 year old man with a known  paraspinal mass which has been in the lymph gland for some extended period  of time.  Biopsies have not proven to be diagnostic of any  lymphoproliferative disease.  He underwent an axillary node biopsy earlier  in this year which also was nondiagnostic.  He presented again with pleural  effusion which on cytology was nondiagnostic.  He comes to the operating  room now with a left-sided groin mass which is a rather enlarged and  pathologic appearing lymph node.   DESCRIPTION OF PROCEDURE:  Following the induction of satisfactory sedation,  the left groin was prepped and draped to be included in a sterile operative  field.  The region around the node was infiltrated with 1% Xylocaine with  epinephrine.  A transverse incision was made over the mass and deepened down  through the capsule of the mass and the mass was dissected free from the  surrounding soft tissues.  It appears to be rather abnormal lymph node.  It  was removed in its entirety and forwarded for pathologic evaluation.  Hemostasis was assured with electrocautery.  Sponge, instrument and sharp  counts were then verified.  Subcutaneous tissues closed with 3-0 Vicryl  sutures  and skin closed with a running 4-0 Monocryl suture and then  reinforced with Steri-Strips.  Sterile dressings applied.  Anesthetic was  reversed and the patient removed from the operating room to the recovery  room in stable condition, having tolerated the procedure well.                                               Leonie Man, M.D.    PB/MEDQ  D:  01/24/2002  T:  01/24/2002  Job:  [id number]
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
## 2010-08-06 NOTE — Op Note (Signed)
[place]. [facility]  Patient:    GENEROSO, CROPPER Visit Number: [id number] MRN: [id number]          Service Type: SUR Location: [id number] Attending Physician:  Emeterio Reeve Dictated by:   Payton Doughty, M.D. Proc. Date: 11/21/00 Admit Date:  11/21/2000                             Operative Report  PREOPERATIVE DIAGNOSIS:  Left paraspinous infiltrating mass.  POSTOPERATIVE DIAGNOSIS:  Left paraspinous infiltrating mass.  OPERATION PERFORMED:  Biopsy of left paraspinous muscle, neural foramen and costal border.  SURGEON:  Payton Doughty, M.D.  ASSISTANT:  Tanya Nones. Jeral Fruit, M.D.  ANESTHESIA:  General endotracheal.  PREP:  Sterile Betadine prep and scrub with alcohol wipe.  COMPLICATIONS:  None.  DESCRIPTION OF PROCEDURE:  The patient is a 76 year old right-handed white male with mediastinal lesion that infiltrated the left paraspinous region from T8-9, T9-10, T10-11, T11-12, left greater than right.  He had had axillary node biopsy to no avail and attempted paraspinous biopsy by the radiologist and now presents for definitive biopsy.  The patient was taken to the operating room, smoothly anesthetized and intubated, and placed prone on the operating table.  Following shave, prep and drape in the usual sterile fashion a marker was placed and level confirmed.  The top most marker was between T11 and T12.  Incision was made from T9, T10 and T11 in the spinous process and transverse process of T10 and T11 were exposed.  The muscle was biopsied and sent as frozen section and appeared grossly abnormal, pale and somewhat underperfused.  During the time that the frozen section was down in pathology being process, additional paraspinous muscle was taken as well as from the neural foramen at T10 in the lateral paraspinous region.  When the path report came back that the tissue had been infiltrated with blue cells, the transverse process of T11 was  removed and the region between the T11 transverse process and the 11th rib was biopsied.  This tissue was also sent for permanent and separate specimen.  In all there was one paraspinous, one neural foraminal, one costal and one lateral paraspinous sent as permanents as well as the frozen paraspinous which came back positive.  Following completion of the biopsies, meticulous hemostasis was obtained.  The wound was irrigated and hemostasis assured.  The fascia was reapproximated with 0 Vicryl in interrupted fashion, subcutaneous tissues  reapproximated with 0 Vicryl in interrupted fashion.  Subcuticular tissues were reapproximated with 3-0 Vicryl in interrupted fashion.  The skin was closed with 3-0 nylon in a running locked fashion.  Betadine Telfa dressing was applied and made occlusive with OpSite.  The patient then returned to the recovery room in good condition. Dictated by:   Payton Doughty, M.D. Attending Physician:  Emeterio Reeve DD:  11/21/00 TD:  11/21/00 Job: [phone]
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
## 2010-08-06 NOTE — Consult Note (Signed)
[name], [name]                          ACCOUNT NO.:  [id number]   MEDICAL RECORD NO.:  [id number]                   PATIENT TYPE:  INP   LOCATION:  4707                                 FACILITY:  [facility]   PHYSICIAN:  [name], M.D.              DATE OF BIRTH:  11-29-1934   DATE OF CONSULTATION:  01/23/2002  DATE OF DISCHARGE:                                   CONSULTATION   REFERRING PHYSICIAN:  Jackie [name], M.D.   REASON FOR CONSULTATION:  1. Left pleural effusion.  2. Lymphoproliferative disorder.   HISTORY OF PRESENT ILLNESS:  The patient is a very nice 76 year old African-  American male whom I initially saw about a year or so ago.  I initially saw  him I think back in August of 2002. At that time he had presented with some  chest pain.  He was found to have a paraspinal mass.  This was in the left  thoracic region.  He also was noted to have a some lymphadenopathy.  This  was in the left axilla.  He underwent a biopsy of the left axillary lymph  node.  Unfortunately, there was no definitive diagnosis.  A  lymphoproliferative process diagnosis was made.   The patient also underwent a transthoracic biopsy of this paraspinal mass.  This also came back as a lymphoproliferative disorder.  The patient did not  wish to have any further procedures done.  He was feeling well.  He was not  having any kind of pain.  He really wanted to get back to his active  lifestyle.  He was working full time.   I have not seen the patient for a year.  He had been doing very well.  He  has been eating well. He has had no fever, sweats or chills.  He denies  chest pain. He has had no back pain.  He denied change in bowel or bladder  habits.   He subsequently presented with shortness of breath that has worsened over  the past several days.  He went to [facility]  A chest x-ray was  subsequently done.  This showed a left pleural effusion.   The patient was subsequently  admitted.  A CT scan of the chest was done.  This showed a large left pleural effusion.  There were no obvious cardiac  abnormalities.  Again he was noted to have the posterior  mediastinal/paravertebral mass.  This appeared to be fairly stable.  Also  noted was some abnormal soft tissue to the right of the thoracic aorta.  This seemed to have worsened slightly.  Subcarinal soft tissue mass was  noted.  This was unchanged.  The upper abdomen looked unremarkable.  Liver,  spleen, kidney, pancreas all looked unremarkable.  No obvious adenopathy is  noted within the abdomen.   The patient has subsequently undergone an ultrasound guided thoracentesis.  Two liters  of amber fluid were removed.  Cytology is pending.   On admission, he had blood work done.  Blood work showed a CBC with white  blood cell count 2.7, hemoglobin 11.2, hematocrit 34, platelet count  150,000.  His metabolic panel showed sodium 141, potassium 4.8, BUN 33,  creatinine 1.6. Liver function tests were all within normal limits.   Fluid analysis showed total protein of 4.4, LDH was normal at 55.  Culture  is pending.   We are subsequently asked to see the patient for further evaluation.   The patient, again, has had no obvious complaints.  Aside from the shortness  of breath, he has felt well.   PAST MEDICAL HISTORY:  Unremarkable.   ALLERGIES:  None.   ADMISSION MEDICATIONS:  1. Tylenol as needed.  2. Laxative as needed.   SOCIAL HISTORY:  The patient has no history of tobacco use. There is no  alcohol use.  He is an Airline pilot.  He is quite religious and has a very  strong faith.   FAMILY HISTORY:  Unremarkable aside from prostate cancer with his father  dying at 46 years old.   REVIEW OF SYMPTOMS:  Is as stated in the history of present illness.  In  addition, there has been no cough.  He has had some increased dyspnea over  the last four to five days.  He has had no hemoptysis.  He has had no  dysphagia.   He has had no headache or blurred vision.  He has had no back  pain.  He has had no change in bowel or bladder habits.  There has been no  swelling of the legs.  There has been no rashes.   PHYSICAL EXAMINATION:  GENERAL:  Well-developed well-nourished black  gentleman in no obvious distress.  VITAL SIGNS:  Temperature 97.2, pulse 74, respirations 20.  Blood pressure  115/58.  HEENT:  Normocephalic, atraumatic skull.  There are no ocular or oral  lesions.  NECK:  There are no palpable cervical or supraclavicular lymph nodes.  Thyroid is not palpable.  LUNGS:  Breath sounds slightly decreased in the left base.  The right lung  field is clear.  CARDIAC:  Regular rate and rhythm with normal S1 and S2.  There are no  murmurs, rubs or bruits.  ABDOMEN:  Soft with good bowel sounds.  There is no palpable abdominal mass.  There is no palpable hepatosplenomegaly.  He does have a left inguinal lymph  node.  This is slightly firm and mobile.  It measures about 1 X 0.5 cm.  Axillary examination shows no bilateral axillary adenopathy.  EXTREMITIES:  No cyanosis, clubbing or edema.  The patient has good range of  motion of his joints.  SKIN:  No rashes, ecchymoses or petechiae.  NEUROLOGICAL:  No focal neurological deficits.   IMPRESSION:  The patient is a 76 year old gentleman now with a new left  pleural effusion.  The etiology of this is unclear.  He did have a  lymphoproliferative disorder one year ago.  Despite multiple biopsies, no  diagnosis was made.   Again, one would have to be concerned about the possibility of a malignant  process.  Certainly over the past year he has had minimal growth of his soft  tissue regions that were abnormal previously.  This would indicate that  whatever process he has is quite indolent.   Will try to make a new diagnosis by excising this left inguinal lymph node. I will speak  to [name] about this being done.  If the left inguinal  lymph node does not  give [place] a diagnosis then we are going to have to get  thoracic surgery involved.  A VATS procedure would be done.  Hopefully,  evaluation within the posterior mediastinum could be accomplished at that  time to obtain tissue so that we can come up with an answer as to what is  going on.   I will repeat his CT scan again.  I think that this would be advantageous to  see if we can't pick up any abnormalities now that the fluid has been  removed from the left pleural space.   As always, it was good to see Mr. Onorato again.  We had some good  fellowship.  He is obviously very faithful and very religious and it was  good to be able to speak with him; Mr. Hua is an inspiration.   We will follow Mr. Wroblewski along closely with you.  Will make  recommendations as needed.                                               [name], M.D.    PRE/MEDQ  D:  01/23/2002  T:  01/23/2002  Job:  [id number]   cc:   Leonie Man, M.D.  [address], [address]  [place]  [place] [address]  Fax: [phone]   Payton Doughty, M.D.

## 2010-08-06 NOTE — Op Note (Signed)
   NAMENAFIS, FARNAN                          ACCOUNT NO.:  [id number]   MEDICAL RECORD NO.:  [id number]                   PATIENT TYPE:   LOCATION:                                       FACILITY:   PHYSICIAN:  Rose Phi. Myna Hidalgo, M.D.              DATE OF BIRTH:  12-May-1934   DATE OF PROCEDURE:  DATE OF DISCHARGE:                                 OPERATIVE REPORT   PROCEDURE:  Left posterior iliac crest bone marrow biopsy and aspirate.   SURGEON:  Rose Phi. Myna Hidalgo, M.D.   DESCRIPTION OF PROCEDURE:  Mr. Sandate was brought to the short stay unit.  He had an IV placed.  His vital signs were all stable.  The patient was turned to his right side.  He was given 5 mg of Versed and  25 mg of Demerol IV for sedation.  The left posterior iliac crest region was  prepped and draped in a sterile fashion.  He had 10 cc of lidocaine  infiltrated in the skin down to the periosteum.   A #11 scalpel was used to make an incision into the skin.  Two bone marrow  aspirates were obtained without difficulty.  A second incision was made  through the skin. A bone marrow biopsy coil was obtained without difficulty.   The patient tolerated the procedure well.  There were no complications.                                               Rose Phi. Myna Hidalgo, M.D.    PRE/MEDQ  D:  01/30/2002  T:  01/30/2002  Job:  [id number]

## 2011-07-13 ENCOUNTER — Encounter: Payer: Self-pay | Admitting: *Deleted

## 2011-09-18 ENCOUNTER — Emergency Department (HOSPITAL_COMMUNITY): Payer: Medicare Other

## 2011-09-18 ENCOUNTER — Emergency Department (HOSPITAL_COMMUNITY)
Admission: EM | Admit: 2011-09-18 | Discharge: 2011-09-18 | Disposition: A | Discharge status: Home / Self Care | Payer: Medicare Other | Attending: Emergency Medicine | Admitting: Emergency Medicine

## 2011-09-18 ENCOUNTER — Encounter (HOSPITAL_COMMUNITY): Payer: Self-pay | Admitting: Emergency Medicine

## 2011-09-18 DIAGNOSIS — I10 Essential (primary) hypertension: Secondary | ICD-10-CM | POA: Insufficient documentation

## 2011-09-18 DIAGNOSIS — M109 Gout, unspecified: Secondary | ICD-10-CM | POA: Insufficient documentation

## 2011-09-18 DIAGNOSIS — C8589 Other specified types of non-Hodgkin lymphoma, extranodal and solid organ sites: Secondary | ICD-10-CM | POA: Insufficient documentation

## 2011-09-18 DIAGNOSIS — M10232 Drug-induced gout, left wrist: Secondary | ICD-10-CM

## 2011-09-18 DIAGNOSIS — M25539 Pain in unspecified wrist: Secondary | ICD-10-CM | POA: Insufficient documentation

## 2011-09-18 DIAGNOSIS — N189 Chronic kidney disease, unspecified: Secondary | ICD-10-CM | POA: Insufficient documentation

## 2011-09-18 MED ORDER — HYDROCODONE-ACETAMINOPHEN 5-325 MG PO TABS: ORAL_TABLET | ORAL | Status: AC

## 2011-09-18 MED ORDER — COLCHICINE 0.6 MG PO TABS
0.6000 mg | ORAL_TABLET | Freq: Once | ORAL | Status: AC
  Administered 2011-09-18: 0.6 mg via ORAL
  Filled 2011-09-18: qty 1

## 2011-09-18 MED ORDER — COLCHICINE 0.6 MG PO TABS: 0.6000 mg | ORAL_TABLET | Freq: Every day | ORAL | Status: DC

## 2011-09-18 MED ORDER — PREDNISONE 20 MG PO TABS
40.0000 mg | ORAL_TABLET | Freq: Once | ORAL | Status: DC
  Filled 2011-09-18: qty 2

## 2011-09-18 MED ORDER — HYDROCODONE-ACETAMINOPHEN 5-325 MG PO TABS
1.0000 | ORAL_TABLET | Freq: Once | ORAL | Status: AC
  Administered 2011-09-18: 1 via ORAL
  Filled 2011-09-18: qty 1

## 2011-09-18 NOTE — ED Notes (Signed)
Pt discharged home. Had no further questions. 

## 2011-09-18 NOTE — ED Notes (Signed)
Pt c/o left wrist pain and swelling x 2 days. Pt reports may have injured it by lifting some chairs recently. Pulses present. Left wrist noted to be red and swollen.

## 2011-09-18 NOTE — Discharge Instructions (Signed)
Gout Gout is an inflammatory condition (arthritis) caused by a buildup of uric acid crystals in the joints. Uric acid is a chemical that is normally present in the blood. Under some circumstances, uric acid can form into crystals in your joints. This causes joint redness, soreness, and swelling (inflammation). Repeat attacks are common. Over time, uric acid crystals can form into masses (tophi) near a joint, causing disfigurement. Gout is treatable and often preventable. CAUSES  The disease begins with elevated levels of uric acid in the blood. Uric acid is produced by your body when it breaks down a naturally found substance called purines. This also happens when you eat certain foods such as meats and fish. Causes of an elevated uric acid level include:  Being passed down from parent to child (heredity).   Diseases that cause increased uric acid production (obesity, psoriasis, some cancers).   Excessive alcohol use.   Diet, especially diets rich in meat and seafood.   Medicines, including certain cancer-fighting drugs (chemotherapy), diuretics, and aspirin.   Chronic kidney disease. The kidneys are no longer able to remove uric acid well.   Problems with metabolism.  Conditions strongly associated with gout include:  Obesity.   High blood pressure.   High cholesterol.   Diabetes.  Not everyone with elevated uric acid levels gets gout. It is not understood why some people get gout and others do not. Surgery, joint injury, and eating too much of certain foods are some of the factors that can lead to gout. SYMPTOMS   An attack of gout comes on quickly. It causes intense pain with redness, swelling, and warmth in a joint.   Fever can occur.   Often, only one joint is involved. Certain joints are more commonly involved:   Base of the big toe.   Knee.   Ankle.   Wrist.   Finger.  Without treatment, an attack usually goes away in a few days to weeks. Between attacks, you  usually will not have symptoms, which is different from many other forms of arthritis. DIAGNOSIS  Your caregiver will suspect gout based on your symptoms and exam. Removal of fluid from the joint (arthrocentesis) is done to check for uric acid crystals. Your caregiver will give you a medicine that numbs the area (local anesthetic) and use a needle to remove joint fluid for exam. Gout is confirmed when uric acid crystals are seen in joint fluid, using a special microscope. Sometimes, blood, urine, and X-ray tests are also used. TREATMENT  There are 2 phases to gout treatment: treating the sudden onset (acute) attack and preventing attacks (prophylaxis). Treatment of an Acute Attack  Medicines are used. These include anti-inflammatory medicines or steroid medicines.   An injection of steroid medicine into the affected joint is sometimes necessary.   The painful joint is rested. Movement can worsen the arthritis.   You may use warm or cold treatments on painful joints, depending which works best for you.   Discuss the use of coffee, vitamin C, or cherries with your caregiver. These may be helpful treatment options.  Treatment to Prevent Attacks After the acute attack subsides, your caregiver may advise prophylactic medicine. These medicines either help your kidneys eliminate uric acid from your body or decrease your uric acid production. You may need to stay on these medicines for a very long time. The early phase of treatment with prophylactic medicine can be associated with an increase in acute gout attacks. For this reason, during the first few months   of treatment, your caregiver may also advise you to take medicines usually used for acute gout treatment. Be sure you understand your caregiver's directions. You should also discuss dietary treatment with your caregiver. Certain foods such as meats and fish can increase uric acid levels. Other foods such as dairy can decrease levels. Your caregiver  can give you a list of foods to avoid. HOME CARE INSTRUCTIONS   Do not take aspirin to relieve pain. This raises uric acid levels.   Only take over-the-counter or prescription medicines for pain, discomfort, or fever as directed by your caregiver.   Rest the joint as much as possible. When in bed, keep sheets and blankets off painful areas.   Keep the affected joint raised (elevated).   Use crutches if the painful joint is in your leg.   Drink enough water and fluids to keep your urine clear or pale yellow. This helps your body get rid of uric acid. Do not drink alcoholic beverages. They slow the passage of uric acid.   Follow your caregiver's dietary instructions. Pay careful attention to the amount of protein you eat. Your daily diet should emphasize fruits, vegetables, whole grains, and fat-free or low-fat milk products.   Maintain a healthy body weight.  SEEK MEDICAL CARE IF:   You have an oral temperature above 102 F (38.9 C).   You develop diarrhea, vomiting, or any side effects from medicines.   You do not feel better in 24 hours, or you are getting worse.  SEEK IMMEDIATE MEDICAL CARE IF:   Your joint becomes suddenly more tender and you have:   Chills.   An oral temperature above 102 F (38.9 C), not controlled by medicine.  MAKE SURE YOU:   Understand these instructions.   Will watch your condition.   Will get help right away if you are not doing well or get worse.  Document Released: 03/04/2000 Document Revised: 02/24/2011 Document Reviewed: 06/15/2009 [facility] Patient Information 2012 [place], [place].    Narcotic and benzodiazepine use may cause drowsiness, slowed breathing or dependence.  Please use with caution and do not drive, operate machinery or watch young children alone while taking them.  Taking combinations of these medications or drinking alcohol will potentiate these effects.

## 2011-09-18 NOTE — ED Notes (Signed)
Pt presents to department for evaluation of L wrist and hand pain. States swelling and pain x2 days. Redness noted to dorsal part of hand. Pain becomes worse with movement, rating 6/10 at the time. Pulses present. Hand and wrist warm and touch. No signs of distress noted at the time.

## 2011-09-18 NOTE — ED Provider Notes (Signed)
History   This chart was scribed for [name]. Nikai Quest, MD scribed by Magnus Sinning. The patient was seen in room TR05C/TR05C seen at 12:29    CSN: [id number]  Arrival date & time 09/18/11  1207   First MD Initiated Contact with Patient 09/18/11 1223      Chief Complaint  Patient presents with  . Wrist Pain    left wrist    (Consider location/radiation/quality/duration/timing/severity/associated sxs/prior treatment) HPI [name] is a 77 y.o. male who presents to the Emergency Department complaining of constant moderate left wrist pain with associated swelling, onset 2 days. Patient does not report any fall, or any specific injury. Denies numbness or tingling to left hand fingers. Patient has not tried any treatments. Reports history of gout. Past Medical History  Diagnosis Date  . Aortic insufficiency     Moderate to Severe  . Atrial flutter     resolved  . Anemia   . PVCs (premature ventricular contractions)   . LV dysfunction   . Non Hodgkin's lymphoma   . Hydronephrosis, left   . Chronic renal insufficiency     Past Surgical History  Procedure Date  . Lymph node dissection     Groin    Family History  Problem Relation Age of Onset  . Prostate cancer Father     History  Substance Use Topics  . Smoking status: Never Smoker   . Smokeless tobacco: Not on file  . Alcohol Use: No      Review of Systems  Musculoskeletal: Positive for joint swelling (left wrist ).    Allergies  Contrast media and Prednisone  Home Medications   Current Outpatient Rx  Name Route Sig Dispense Refill  . TRIAMCINOLONE ACETONIDE 0.1 % EX CREA Topical Apply 1 application topically daily as needed. For rash    . COLCHICINE 0.6 MG PO TABS Oral Take 1 tablet (0.6 mg total) by mouth daily. 7 tablet 0  . HYDROCODONE-ACETAMINOPHEN 5-325 MG PO TABS  1-2 tablets po q 6 hours prn moderate to severe pain 15 tablet 0    BP 120/68  Pulse 88  Temp 98.3 F (36.8 C) (Oral)  Resp 18   SpO2 99%  Physical Exam  Nursing note and vitals reviewed. Constitutional: He is oriented to person, place, and time. He appears well-developed and well-nourished. No distress.  HENT:  Head: Normocephalic and atraumatic.  Eyes: EOM are normal.  Neck: Neck supple. No tracheal deviation present.  Cardiovascular: Normal rate and intact distal pulses.   Pulmonary/Chest: Effort normal. No respiratory distress.  Musculoskeletal: Normal range of motion.       Warmth and mild erythema along dorsum of left wrist. Good DP pulse. ROM intact, but somehwat limited due to swellnig and pain.    Neurological: He is alert and oriented to person, place, and time. He has normal strength. No sensory deficit.       Good sensation to distal hand and fingers.  Skin: Skin is warm and dry.        Cap refill normal.   Psychiatric: He has a normal mood and affect. His behavior is normal.    ED Course  Procedures (including critical care time) DIAGNOSTIC STUDIES: Oxygen Saturation is 99% on room air, normal by my interpretation.    COORDINATION OF CARE:  Dg Wrist Complete Left  09/18/2011  *RADIOLOGY REPORT*  Clinical Data: Wrist pain  LEFT WRIST - COMPLETE 3+ VIEW  Comparison: None  Findings: No acute fracture or subluxation identified.  Old ulnar styloid fracture is suspected.  No radiopaque foreign bodies or soft tissue calcifications.  IMPRESSION:  1.  No acute findings.  Original Report Authenticated By: Rosealee Albee, M.D.     1. 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  No fractures.  Pt's symptoms consistent with arthritis or gouty arthritis.  I doubt infection.  No history of sig injury.  Pt had gouty bursitis following minor blunt injury to elbow 2 years ago, I think similar.  Pt was treated with colchicine once daily and will do the same here.  Pt aware that renal fxn may be affected, however he is allergic to prednisone. Encouraged  pt to follow up with PCP.   MDM  I personally performed the services described in this documentation, which was scribed in my presence. The recorded information has been reviewed and considered.         [name]. Aerith Canal, MD 09/18/11 1332

## 2012-01-30 ENCOUNTER — Ambulatory Visit: Payer: Medicare Other | Admitting: Cardiology

## 2012-02-22 ENCOUNTER — Ambulatory Visit (INDEPENDENT_AMBULATORY_CARE_PROVIDER_SITE_OTHER): Payer: Medicare Other | Admitting: Cardiology

## 2012-02-22 ENCOUNTER — Encounter: Payer: Self-pay | Admitting: Cardiology

## 2012-02-22 VITALS — BP 124/58 | HR 72 | Ht 71.0 in | Wt 151.8 lb

## 2012-02-22 DIAGNOSIS — I519 Heart disease, unspecified: Secondary | ICD-10-CM

## 2012-02-22 DIAGNOSIS — I4949 Other premature depolarization: Secondary | ICD-10-CM

## 2012-02-22 DIAGNOSIS — I351 Nonrheumatic aortic (valve) insufficiency: Secondary | ICD-10-CM

## 2012-02-22 DIAGNOSIS — I359 Nonrheumatic aortic valve disorder, unspecified: Secondary | ICD-10-CM

## 2012-02-22 DIAGNOSIS — I493 Ventricular premature depolarization: Secondary | ICD-10-CM | POA: Insufficient documentation

## 2012-02-22 NOTE — Progress Notes (Signed)
Trilby Drummer Date of Birth: 1934-12-09 Medical Record #[id number]  History of Present Illness: Mr. [name] is seen today for followup. He is a 77 year old black male who has a history of chronic aortic insufficiency that is moderate to severe. He had a remote history of atrial flutter that resolved spontaneously. He has had a history of non-Hodgkin's lymphoma without recurrence. He was last seen in January 2012. Echocardiogram at that time demonstrated left ventricular dysfunction with severe hypokinesis of the lateral, inferior lateral, and inferior wall segments. Ejection fraction was 40-45%. These findings were new compared to 2009. We discussed this at length with the patient and recommended cardiac catheterization. He flatly refused. He was concerned about the risk of contrast nephropathy. His baseline creatinine was 2. He refused further medical therapy. He has been lost to followup. On return today he reports that he is feeling well. He states that the only medication he needs it is God. He does have some ankle swelling. He denies any significant shortness of breath, chest pain, or palpitations. He's had no dizziness or orthopnea.  Current Outpatient Prescriptions on File Prior to Visit  Medication Sig Dispense Refill  . triamcinolone cream (KENALOG) 0.1 % Apply 1 application topically daily as needed. For rash        Allergies  Allergen Reactions  . Contrast Media (Iodinated Diagnostic Agents) Other (See Comments)    Patient does not want dye because it is hard on his kidneys  . Prednisone Rash    Past Medical History  Diagnosis Date  . Aortic insufficiency     Moderate to Severe  . Atrial flutter     resolved  . Anemia   . PVCs (premature ventricular contractions)   . LV dysfunction   . Non Hodgkin's lymphoma   . Hydronephrosis, left   . CKD (chronic kidney disease), stage III     Past Surgical History  Procedure Date  . Lymph node dissection     Groin    History    Smoking status  . Never Smoker   Smokeless tobacco  . Not on file    History  Alcohol Use No    Family History  Problem Relation Age of Onset  . Prostate cancer Father     Review of Systems: The review of systems is positive for ankle swelling.  All other systems were reviewed and are negative.  Physical Exam: BP 124/58  Pulse 72  Ht 5\' 11"  (1.803 m)  Wt 151 lb 12.8 oz (68.856 kg)  BMI 21.17 kg/m2 He is a pleasant, elderly black male in no acute distress. HEENT: Normocephalic, atraumatic. Pupils are equal round and reactive. Sclera clear. Oropharynx is clear. Neck is without JVD or bruits. He has no adenopathy or thyromegaly. Lungs: Clear Cardiovascular: Irregular rate and rhythm with a grade 2/6 diastolic murmur heard best at the right upper sternal border. PMI is prominent. Abdomen: Soft and nontender. No masses or bruits. Extremities: 1+ right ankle edema, 1-2+ left ankle edema. Pedal pulses are palpable. Neuro: Alert and oriented x3. Cranial nerves II through XII are intact. Skin: Warm and dry. LABORATORY DATA: ECG today demonstrates normal sinus rhythm with frequent PVCs. There is LVH with repolarization abnormality.  Assessment / Plan: 1. Chronic aortic insufficiency, moderate to severe. Patient does have some ankle edema but otherwise is without cardiac symptoms.  2. Left ventricular dysfunction with ejection fraction of 40-45%. Patient may benefit from an ACE inhibitor or ARB but I do not have records of his  kidney function. We will request a copy of his most recent lab work. We will update his echocardiogram today. I think he would be reluctant to try any new medications or to consider cardiac catheterization to further define his ischemic substrate and consider him for aortic valve replacement.  3. PVCs, chronic. Asymptomatic.  4. History of non-Hodgkin's lymphoma.

## 2012-02-22 NOTE — Patient Instructions (Signed)
We will schedule you for an echocardiogram and get results of your lab work from Dr. Su Hilt

## 2012-03-01 ENCOUNTER — Other Ambulatory Visit (HOSPITAL_COMMUNITY): Payer: Medicare Other

## 2012-03-30 ENCOUNTER — Other Ambulatory Visit (HOSPITAL_COMMUNITY): Payer: Medicare Other

## 2012-04-02 ENCOUNTER — Encounter: Payer: Self-pay | Admitting: Cardiology

## 2012-04-12 ENCOUNTER — Inpatient Hospital Stay (HOSPITAL_COMMUNITY)
Admission: EM | Admit: 2012-04-12 | Discharge: 2012-04-17 | DRG: 292 | Disposition: A | Discharge status: Home / Self Care | Payer: Medicare Other | Attending: Cardiology | Admitting: Cardiology

## 2012-04-12 ENCOUNTER — Emergency Department (HOSPITAL_COMMUNITY): Payer: Medicare Other

## 2012-04-12 ENCOUNTER — Encounter (HOSPITAL_COMMUNITY): Payer: Self-pay | Admitting: *Deleted

## 2012-04-12 DIAGNOSIS — I340 Nonrheumatic mitral (valve) insufficiency: Secondary | ICD-10-CM

## 2012-04-12 DIAGNOSIS — I5041 Acute combined systolic (congestive) and diastolic (congestive) heart failure: Secondary | ICD-10-CM

## 2012-04-12 DIAGNOSIS — I5021 Acute systolic (congestive) heart failure: Secondary | ICD-10-CM

## 2012-04-12 DIAGNOSIS — I351 Nonrheumatic aortic (valve) insufficiency: Secondary | ICD-10-CM | POA: Diagnosis present

## 2012-04-12 DIAGNOSIS — I4949 Other premature depolarization: Secondary | ICD-10-CM

## 2012-04-12 DIAGNOSIS — Z9221 Personal history of antineoplastic chemotherapy: Secondary | ICD-10-CM

## 2012-04-12 DIAGNOSIS — I493 Ventricular premature depolarization: Secondary | ICD-10-CM

## 2012-04-12 DIAGNOSIS — D649 Anemia, unspecified: Secondary | ICD-10-CM | POA: Diagnosis present

## 2012-04-12 DIAGNOSIS — I2589 Other forms of chronic ischemic heart disease: Secondary | ICD-10-CM | POA: Diagnosis present

## 2012-04-12 DIAGNOSIS — I129 Hypertensive chronic kidney disease with stage 1 through stage 4 chronic kidney disease, or unspecified chronic kidney disease: Secondary | ICD-10-CM | POA: Diagnosis present

## 2012-04-12 DIAGNOSIS — I5043 Acute on chronic combined systolic (congestive) and diastolic (congestive) heart failure: Principal | ICD-10-CM | POA: Diagnosis present

## 2012-04-12 DIAGNOSIS — N1330 Unspecified hydronephrosis: Secondary | ICD-10-CM | POA: Diagnosis present

## 2012-04-12 DIAGNOSIS — I509 Heart failure, unspecified: Secondary | ICD-10-CM

## 2012-04-12 DIAGNOSIS — N179 Acute kidney failure, unspecified: Secondary | ICD-10-CM | POA: Diagnosis present

## 2012-04-12 DIAGNOSIS — N183 Chronic kidney disease, stage 3 unspecified: Secondary | ICD-10-CM

## 2012-04-12 DIAGNOSIS — Z87898 Personal history of other specified conditions: Secondary | ICD-10-CM

## 2012-04-12 DIAGNOSIS — I080 Rheumatic disorders of both mitral and aortic valves: Secondary | ICD-10-CM | POA: Diagnosis present

## 2012-04-12 DIAGNOSIS — I359 Nonrheumatic aortic valve disorder, unspecified: Secondary | ICD-10-CM

## 2012-04-12 DIAGNOSIS — I498 Other specified cardiac arrhythmias: Secondary | ICD-10-CM

## 2012-04-12 HISTORY — DX: Unspecified hydronephrosis: N13.30

## 2012-04-12 HISTORY — DX: Benign prostatic hyperplasia without lower urinary tract symptoms: N40.0

## 2012-04-12 HISTORY — DX: Nonrheumatic mitral (valve) insufficiency: I34.0

## 2012-04-12 HISTORY — DX: Chronic systolic (congestive) heart failure: I50.22

## 2012-04-12 LAB — COMPREHENSIVE METABOLIC PANEL WITH GFR
ALT: 40 U/L (ref 0–53)
Alkaline Phosphatase: 109 U/L (ref 39–117)
BUN: 39 mg/dL — ABNORMAL HIGH (ref 6–23)
CO2: 19 meq/L (ref 19–32)
GFR calc Af Amer: 34 mL/min — ABNORMAL LOW (ref >90)
GFR calc non Af Amer: 29 mL/min — ABNORMAL LOW (ref >90)
Glucose, Bld: 135 mg/dL — ABNORMAL HIGH (ref 70–99)
Potassium: 4.4 meq/L (ref 3.5–5.1)
Sodium: 144 meq/L (ref 135–145)
Total Bilirubin: 1 mg/dL (ref 0.3–1.2)

## 2012-04-12 LAB — CBC WITH DIFFERENTIAL/PLATELET
Basophils Absolute: 0 10*3/uL (ref 0.0–0.1)
Basophils Relative: 1 % (ref 0–1)
Eosinophils Absolute: 0 10*3/uL (ref 0.0–0.7)
Eosinophils Relative: 1 % (ref 0–5)
HCT: 32.8 % — ABNORMAL LOW (ref 39.0–52.0)
Hemoglobin: 11 g/dL — ABNORMAL LOW (ref 13.0–17.0)
Lymphocytes Relative: 18 % (ref 12–46)
Lymphs Abs: 0.6 K/uL — ABNORMAL LOW (ref 0.7–4.0)
MCH: 29.3 pg (ref 26.0–34.0)
MCHC: 33.5 g/dL (ref 30.0–36.0)
MCV: 87.5 fL (ref 78.0–100.0)
Monocytes Absolute: 0.3 10*3/uL (ref 0.1–1.0)
Monocytes Relative: 10 % (ref 3–12)
Neutro Abs: 2.3 10*3/uL (ref 1.7–7.7)
Neutrophils Relative %: 71 % (ref 43–77)
Platelets: 111 K/uL — ABNORMAL LOW (ref 150–400)
RBC: 3.75 MIL/uL — ABNORMAL LOW (ref 4.22–5.81)
RDW: 14.9 % (ref 11.5–15.5)
WBC: 3.2 K/uL — ABNORMAL LOW (ref 4.0–10.5)

## 2012-04-12 LAB — TROPONIN I
Troponin I: <0.3 ng/mL (ref <0.30)
Troponin I: <0.3 ng/mL (ref <0.30)

## 2012-04-12 LAB — COMPREHENSIVE METABOLIC PANEL
AST: 31 U/L (ref 0–37)
Albumin: 3.2 g/dL — ABNORMAL LOW (ref 3.5–5.2)
Calcium: 8.4 mg/dL (ref 8.4–10.5)
Chloride: 113 mEq/L — ABNORMAL HIGH (ref 96–112)
Creatinine, Ser: 2.07 mg/dL — ABNORMAL HIGH (ref 0.50–1.35)
Total Protein: 6.1 g/dL (ref 6.0–8.3)

## 2012-04-12 LAB — PROTIME-INR
INR: 1.27 (ref 0.00–1.49)
Prothrombin Time: 15.6 seconds — ABNORMAL HIGH (ref 11.6–15.2)

## 2012-04-12 LAB — PSA: PSA: 7.5 ng/mL — ABNORMAL HIGH (ref <=4.00)

## 2012-04-12 LAB — PRO B NATRIURETIC PEPTIDE: Pro B Natriuretic peptide (BNP): 18809 pg/mL — ABNORMAL HIGH (ref 0–450)

## 2012-04-12 LAB — APTT: aPTT: 32 seconds (ref 24–37)

## 2012-04-12 MED ORDER — SODIUM CHLORIDE 0.9 % IJ SOLN
3.0000 mL | Freq: Two times a day (BID) | INTRAMUSCULAR | Status: DC
  Administered 2012-04-12 – 2012-04-16 (×8): 3 mL via INTRAVENOUS

## 2012-04-12 MED ORDER — NITROGLYCERIN 0.4 MG SL SUBL: 0.4000 mg | SUBLINGUAL_TABLET | SUBLINGUAL | Status: DC | PRN

## 2012-04-12 MED ORDER — SODIUM CHLORIDE 0.9 % IJ SOLN: 3.0000 mL | INTRAMUSCULAR | Status: DC | PRN

## 2012-04-12 MED ORDER — ZOLPIDEM TARTRATE 5 MG PO TABS
5.0000 mg | ORAL_TABLET | Freq: Every evening | ORAL | Status: DC | PRN
  Administered 2012-04-13: 5 mg via ORAL
  Filled 2012-04-12: qty 1

## 2012-04-12 MED ORDER — ALPRAZOLAM 0.25 MG PO TABS: 0.2500 mg | ORAL_TABLET | Freq: Two times a day (BID) | ORAL | Status: DC | PRN

## 2012-04-12 MED ORDER — TRIAMCINOLONE ACETONIDE 0.1 % EX CREA
1.0000 "application " | TOPICAL_CREAM | Freq: Every day | CUTANEOUS | Status: DC | PRN
  Filled 2012-04-12: qty 15

## 2012-04-12 MED ORDER — FUROSEMIDE 10 MG/ML IJ SOLN
40.0000 mg | Freq: Three times a day (TID) | INTRAMUSCULAR | Status: DC
  Administered 2012-04-13 – 2012-04-14 (×6): 40 mg via INTRAVENOUS
  Filled 2012-04-12 (×12): qty 4

## 2012-04-12 MED ORDER — FUROSEMIDE 10 MG/ML IJ SOLN
40.0000 mg | Freq: Once | INTRAMUSCULAR | Status: AC
  Administered 2012-04-12: 40 mg via INTRAVENOUS
  Filled 2012-04-12: qty 4

## 2012-04-12 MED ORDER — CARVEDILOL 3.125 MG PO TABS
3.1250 mg | ORAL_TABLET | Freq: Two times a day (BID) | ORAL | Status: DC
  Administered 2012-04-13 – 2012-04-16 (×7): 3.125 mg via ORAL
  Filled 2012-04-12 (×12): qty 1

## 2012-04-12 MED ORDER — SODIUM CHLORIDE 0.9 % IV SOLN: 250.0000 mL | INTRAVENOUS | Status: DC | PRN

## 2012-04-12 MED ORDER — ACETAMINOPHEN 325 MG PO TABS
650.0000 mg | ORAL_TABLET | ORAL | Status: DC | PRN
  Administered 2012-04-15: 650 mg via ORAL
  Filled 2012-04-12: qty 2

## 2012-04-12 MED ORDER — NESIRITIDE 1.5 MG IV SOLR
0.0100 ug/kg/min | INTRAVENOUS | Status: DC
  Administered 2012-04-12 – 2012-04-14 (×2): 0.01 ug/kg/min via INTRAVENOUS
  Filled 2012-04-12 (×2): qty 5

## 2012-04-12 MED ORDER — ONDANSETRON HCL 4 MG/2ML IJ SOLN: 4.0000 mg | Freq: Four times a day (QID) | INTRAMUSCULAR | Status: DC | PRN

## 2012-04-12 NOTE — ED Notes (Signed)
To ED for eval of sob that started last pm. Denies pain. Speaks in full clear sentences. Skin w/d

## 2012-04-12 NOTE — ED Notes (Signed)
Patient transported to X-ray 

## 2012-04-12 NOTE — ED Provider Notes (Signed)
History     CSN: [id number]  Arrival date & time 04/12/12  1035   First MD Initiated Contact with Patient 04/12/12 1207      Chief Complaint  Patient presents with  . Shortness of Breath    (Consider location/radiation/quality/duration/timing/severity/associated sxs/prior treatment) HPI Comments: Patient reports a prior history of congestive heart failure but is currently not on furosemide or any other significant diuretics. The patient sees Dr. Peter Swaziland in the heart failure clinic. He reports since last night, the patient has had significant shortness of breath that seems more intermittent. He reports orthopnea and was not able to lay flat and therefore got very little sleep. He also has a history of chronic renal insufficiency. He reports he has a history of an irregular heart rhythm and wonders if that may be contributing to his symptoms. He reports a mild cough but nonproductive, no fever or chills, unusual weight loss. He reports no vomiting or diaphoresis. He also denies any chest pain or back pain. He reports he is to see his heart doctor for a catheterization on February 3. Otherwise he reports no history of hypertension or coronary artery disease.  Patient is a 78 y.o. male presenting with shortness of breath. The history is provided by the patient.  Shortness of Breath  Associated symptoms include cough and shortness of breath. Pertinent negatives include no chest pain, no fever, no rhinorrhea and no wheezing.    Past Medical History  Diagnosis Date  . Aortic insufficiency     Moderate to Severe  . Atrial flutter     resolved  . Anemia   . PVCs (premature ventricular contractions)   . LV dysfunction   . Hydronephrosis, left   . CKD (chronic kidney disease), stage III   . Non Hodgkin's lymphoma     resolved >10 yr ago    Past Surgical History  Procedure Date  . Lymph node dissection     Groin    Family History  Problem Relation Age of Onset  . Prostate cancer  Father     History  Substance Use Topics  . Smoking status: Never Smoker   . Smokeless tobacco: Not on file  . Alcohol Use: No      Review of Systems  Constitutional: Negative for fever and chills.  HENT: Negative for congestion and rhinorrhea.   Respiratory: Positive for cough and shortness of breath. Negative for chest tightness and wheezing.   Cardiovascular: Positive for leg swelling. Negative for chest pain.  Musculoskeletal: Negative for back pain.    Allergies  Contrast media and Prednisone  Home Medications   Current Outpatient Rx  Name  Route  Sig  Dispense  Refill  . TRIAMCINOLONE ACETONIDE 0.1 % EX CREA   Topical   Apply 1 application topically daily as needed. For rash           BP 131/68  Pulse 60  Temp 98.3 F (36.8 C) (Oral)  Resp 16  SpO2 98%  Physical Exam  Nursing note and vitals reviewed. Constitutional: He is oriented to person, place, and time. He appears well-developed and well-nourished. No distress.  HENT:  Head: Normocephalic and atraumatic.  Eyes: Pupils are equal, round, and reactive to light. No scleral icterus.  Neck: Neck supple. JVD present.  Cardiovascular: Normal rate and regular rhythm.   No murmur heard. Pulmonary/Chest: Effort normal. No respiratory distress. He has no wheezes. He has rales.  Abdominal: Soft. He exhibits no distension. There is no tenderness.  Musculoskeletal: He exhibits edema.  Neurological: He is alert and oriented to person, place, and time. No cranial nerve deficit. Coordination normal.  Skin: Skin is warm.  Psychiatric: He has a normal mood and affect.    ED Course  Procedures (including critical care time)  Labs Reviewed  CBC WITH DIFFERENTIAL - Abnormal; Notable for the following:    WBC 3.2 (*)     RBC 3.75 (*)     Hemoglobin 11.0 (*)     HCT 32.8 (*)     Platelets 111 (*)  PLATELET COUNT CONFIRMED BY SMEAR   Lymphs Abs 0.6 (*)     All other components within normal limits    COMPREHENSIVE METABOLIC PANEL - Abnormal; Notable for the following:    Chloride 113 (*)     Glucose, Bld 135 (*)     BUN 39 (*)     Creatinine, Ser 2.07 (*)     Albumin 3.2 (*)     GFR calc non Af Amer 29 (*)     GFR calc Af Amer 34 (*)     All other components within normal limits  PRO B NATRIURETIC PEPTIDE - Abnormal; Notable for the following:    Pro B Natriuretic peptide (BNP) 18809.0 (*)     All other components within normal limits  PROTIME-INR - Abnormal; Notable for the following:    Prothrombin Time 15.6 (*)     All other components within normal limits  TROPONIN I  APTT   Dg Chest 2 View  04/12/2012  *RADIOLOGY REPORT*  Clinical Data: Shortness of breath.  Lower extremity swelling.  CHEST - 2 VIEW  Comparison: 12/26/2009  Findings: Small bilateral pleural effusions, left larger than right.  Bibasilar atelectasis, also worse on the left.  Mild cardiomegaly.  Hyperinflation of the lungs.  No acute bony abnormality.  IMPRESSION: Bilateral effusions and bibasilar atelectasis, left greater than right.  Cardiomegaly, COPD.   Original Report Authenticated By: Charlett Nose, M.D.      1. CHF (congestive heart failure)   2. PVC (premature ventricular contraction)   3. Bigeminy     3:40 PM Patient reportedly does feel improved after going to the bathroom approximate 4 or 5 times. However on cardiac monitor, he is having multiple episodes of premature beats with no different hemodynamic changes. He continues to deny any chest pain. I've consulted the [facility] cardiology for consultation for possible admission given his CHF exacerbation and change in heart rate.   EKG performed at time 15:06, shows sinus rhythm with rate of 85 with frequent premature ventricular beats and a bigeminy pattern. Left anterior fascicular block is present. Left axis deviation is noted. Likely left ventricular hypertrophy with mild repolarization abnormalities are noted as well.  MDM   Patient with symptoms  of congestive heart failure with known history. He has no chest pain, fever sig out infection or coronary disease is likely culprit of his exacerbation. He does have JVD as well as significant lower extremity edema.  Chest x-ray reveals bilateral effusions after my own review and interpretation. His BNP is quite elevated at over 18,000 whereas 3 years ago it was only 475.  Patient is given IV Lasix for diuresis and will reassess the patient to see if he is improved.        Gavin Pound. Raffaela Ladley, MD 04/12/12 1540

## 2012-04-12 NOTE — H&P (Signed)
History and Physical   Patient ID: [name] MRN: [id number], DOB/AGE: [birth year] 78 y.o. Date of Encounter: 04/12/2012  Primary Physician: [name], MD Primary Cardiologist: PJ  Chief Complaint:  SOB  HPI:  78 year old male with a history of AOV stenosis and AI was seen by [name] in December. He also has a history of LVD with WMA but has refused a cath because of chronic renal insufficiency.   Pt describes onset of SOB, PND over the last 2 days. He denies orthopnea. This is new for him. He has had ankle edema for about 2 weeks. These symptoms are new to him. He has not had any chest pain. He is generally active and able to walk as much as he wants to. He is able to do a flight of steps without stopping. He does not weigh daily but has not noticed any weight gain recently. Pt came to the ER today because of the SOB. Currently, at rest, he is not orthopneic and resting comfortably.  Past Medical History  Diagnosis Date  . Aortic insufficiency     Moderate to Severe  . Atrial flutter     resolved  . Anemia   . PVCs (premature ventricular contractions)   . LV dysfunction   . Hydronephrosis, left   . CKD (chronic kidney disease), stage III   . Non Hodgkin's lymphoma     resolved >10 yr ago     Surgical History:  Past Surgical History  Procedure Date  . Lymph node dissection     Groin     I have reviewed the patient's current medications. Prior to Admission medications   Medication Sig Start Date End Date Taking? Authorizing Provider  triamcinolone cream (KENALOG) 0.1 % Apply 1 application topically daily as needed. For rash   Yes Historical Provider, MD   Allergies:  Allergies  Allergen Reactions  . Contrast Media (Iodinated Diagnostic Agents) Other (See Comments)    Patient does not want dye because it is hard on his kidneys  . Prednisone Rash    History   Social History  . Marital Status: Legally Separated    Spouse Name: N/A    Number of  Children: 1  . Years of Education: N/A   Occupational History  .     Social History Main Topics  . Smoking status: Never Smoker   . Smokeless tobacco: Not on file  . Alcohol Use: No  . Drug Use: No  . Sexually Active: Not on file   Other Topics Concern  . Not on file   Social History Narrative  . No narrative on file    Family History  Problem Relation Age of Onset  . Prostate cancer Father    Family Status  Relation Status Death Age  . Father Deceased   . Mother Deceased     Review of Systems:   Full 14-point review of systems otherwise negative except as noted above.   Physical Exam: Blood pressure 127/83, pulse 98, temperature 98.3 F (36.8 C), temperature source Oral, resp. rate 20, SpO2 97.00%. General: Well developed, well nourished, male in no acute distress. Head: Normocephalic, atraumatic, sclera non-icteric, no xanthomas, nares are without discharge. Dentition: good Neck: carotid bruits. JVD at 12 cm. No thyromegally Lungs: Good expansion bilaterally. without wheezes or rhonchi. Rales and crackles bases. Heart: IRRegular rate and rhythm with S1 S2.  No S3 or S4.  2/6 SEM, diastolic murmur, no rubs, or gallops appreciated. Abdomen: Soft,  non-tender, non-distended with normoactive bowel sounds. No hepatomegaly. No rebound/guarding. No obvious abdominal masses. Msk:  Strength and tone appear normal for age. No joint deformities or effusions, no spine or costo-vertebral angle tenderness. Extremities: No clubbing or cyanosis. No edema.  Distal pedal pulses are 2+ in 4 extrem Neuro: Alert and oriented X 3. Moves all extremities spontaneously. No focal deficits noted. Psych:  Responds to questions appropriately with a normal affect. Skin: No rashes or lesions noted  Labs:   Lab Results  Component Value Date   WBC 3.2* 04/12/2012   HGB 11.0* 04/12/2012   HCT 32.8* 04/12/2012   MCV 87.5 04/12/2012   PLT 111* 04/12/2012    Basename 04/12/12 1224  INR 1.27      Lab 04/12/12 1224  NA 144  K 4.4  CL 113*  CO2 19  BUN 39*  CREATININE 2.07*  CALCIUM 8.4  PROT 6.1  BILITOT 1.0  ALKPHOS 109  ALT 40  AST 31  GLUCOSE 135*    Basename 04/12/12 1224  CKTOTAL --  CKMB --  TROPONINI <0.30   Pro B Natriuretic peptide (BNP)  Date/Time Value Range Status  04/12/2012 12:24 PM 18809.0* 0 - 450 pg/mL Final  12/26/2009  2:02 AM 475.0* 0.0 - 100.0 pg/mL Final   Radiology/Studies:  Dg Chest 2 View 04/12/2012  *RADIOLOGY REPORT*  Clinical Data: Shortness of breath.  Lower extremity swelling.  CHEST - 2 VIEW  Comparison: 12/26/2009  Findings: Small bilateral pleural effusions, left larger than right.  Bibasilar atelectasis, also worse on the left.  Mild cardiomegaly.  Hyperinflation of the lungs.  No acute bony abnormality.  IMPRESSION: Bilateral effusions and bibasilar atelectasis, left greater than right.  Cardiomegaly, COPD.   Original Report Authenticated By: Charlett Nose, M.D.    Cardiac Cath:Never had  Echo: 04/05/2010 Study Conclusions - Left ventricle: The cavity size was mildly dilated. Wall thickness was normal. Systolic function was mildly to moderately reduced. The estimated ejection fraction was in the range of 40% to 45%. Severe hypokinesis of the lateral, inferolateral, and inferior myocardium. Doppler parameters are consistent with abnormal left ventricular relaxation (grade 1 diastolic dysfunction). - Aortic valve: Trileaflet; poorly visualized. Aortic valve appears redundant. There was mild stenosis. Moderate regurgitation. Valve area: 1.22cm^2(VTI). Valve area: 1.33cm^2 (Vmax). - Aortic root: The aortic root was moderately dilated. - Mitral valve: Mild regurgitation. - Left atrium: The atrium was moderately dilated. - Right atrium: The atrium was mildly to moderately dilated. - Atrial septum: No defect or patent foramen ovale was identified. - Tricuspid valve: Moderate regurgitation. - Pericardium, extracardiac: A small  pericardial effusion was identified circumferential to the heart. - Impressions: The aortic valve appears struturally abnormal. Consider TEE, if clinically indicated. Impressions: - The aortic valve appears struturally abnormal. Consider TEE, if clinically indicated.  ECG: 12-Apr-2012 15:06:45 [facility]/ED ROUTINE RECORD AGE IS NOT ENTERED, ASSUMED TO BE 78 YEARS OLD FOR PURPOSE OF ECG INTERPRETATION SINUS RHYTHM ~ normal P axis, V-rate 50- 99 VENTRICULAR BIGEMINY ~ bigeminy string>4 w/ V complexes LEFT ANTERIOR FASCICULAR BLOCK ~ axis(240,-40), init forces inf PROBABLE LVH WITH SECONDARY REPOL ABNRM ~ R56L/RISIII/S12R56/S3RL & rep abn Standard 12 Lead Report ~ Unconfirmed Interpretation Abnormal ECG 39mm/s 12mm/mV 150Hz  8.0.1 12SL 235 CID: [id number] Referred by: Unconfirmed Vent. rate 85 BPM PR interval 196 ms QRS duration 96 ms QT/QTc 428/413 ms P-R-T axes 43 -73 96  ASSESSMENT AND PLAN:  Principal Problem:  *Acute combined systolic and diastolic CHF, NYHA class 3 Active Problems:  Aortic insufficiency  PVCs (premature ventricular contractions)  LV dysfunction  CKD (chronic kidney disease), stage III   Signed,  Rhonda Barrett PA-C 04/12/2012, 5:00 PM  Patient seen with PA, agree with the above note. Patient with history of moderate to severe AI and cardiomyopathy (? Ischemic given wall motion abnormalities) presented with clinical picture consistent with acute systolic CHF.   1. CHF: Acute systolic (EF 40% on last echo) with volume overload on exam.  No chest pain.  Significant aortic insufficiency may be contributing.   - Lasix 40 mg IV every 8 hours - Given CKD and possible severe AI, will use nesiritide gtt for IV afterload reduction, likely transition eventually to hydralazine/nitrates.  - Follow creatinine closely with diuresis.  - Echocardiogram - Ideally, he would eventually have LHC for coronary assessment given wall motion abnormalities on prior  echo, but he has been reluctant to agree to this in the past.  Cardiac enzymes negative today.  2. Aortic insufficiency: Diastolic murmur on exam but pulse pressure is not wide.  Moderate to severe on prior imaging (none recently).  As above, repeating echo and will use nesiritide for IV afterload reduction.  3. CKD: Creatinine appears to be at this baseline.  Will need to watch closely with diuresis (may improve as renal venous pressure is decreased).   4. Rhythm: Frequent PVCs and PACs but predominantly NSR on telemetry.  Will start low dose Coreg at 3.125 mg bid.   Marca Ancona 04/12/2012 5:07 PM

## 2012-04-13 ENCOUNTER — Encounter (HOSPITAL_COMMUNITY): Payer: Self-pay | Admitting: General Practice

## 2012-04-13 DIAGNOSIS — I059 Rheumatic mitral valve disease, unspecified: Secondary | ICD-10-CM

## 2012-04-13 LAB — TROPONIN I
Troponin I: <0.3 ng/mL (ref <0.30)
Troponin I: <0.3 ng/mL (ref <0.30)

## 2012-04-13 LAB — BASIC METABOLIC PANEL
CO2: 22 mEq/L (ref 19–32)
Chloride: 114 mEq/L — ABNORMAL HIGH (ref 96–112)
GFR calc non Af Amer: 27 mL/min — ABNORMAL LOW (ref >90)
Potassium: 4.1 mEq/L (ref 3.5–5.1)
Sodium: 145 mEq/L (ref 135–145)

## 2012-04-13 LAB — LIPID PANEL
LDL Cholesterol: 64 mg/dL (ref 0–99)
Triglycerides: 68 mg/dL (ref <150)
VLDL: 14 mg/dL (ref 0–40)

## 2012-04-13 MED ORDER — CYCLOBENZAPRINE HCL 10 MG PO TABS
5.0000 mg | ORAL_TABLET | Freq: Three times a day (TID) | ORAL | Status: DC | PRN
  Administered 2012-04-15: 5 mg via ORAL
  Filled 2012-04-13: qty 2

## 2012-04-13 NOTE — Progress Notes (Signed)
Patient ID: [name], male   DOB: [birth year], 78 y.o.   MRN: [id number]    Subjective:  Denies SSCP, palpitations or Dyspnea Cramps in legs  Objective:  Filed Vitals:   04/13/12 0600 04/13/12 0700 04/13/12 0800 04/13/12 0813  BP: 102/57 104/68 128/67 128/67  Pulse: 88 35 89 93  Temp:    97.6 F (36.4 C)  TempSrc:    Oral  Resp: 17 15 23 29   Weight:      SpO2: 100% 100% 98% 96%    Intake/Output from previous day:  Intake/Output Summary (Last 24 hours) at 04/13/12 7829 Last data filed at 04/13/12 0900  Gross per 24 hour  Intake  675.2 ml  Output   1675 ml  Net -999.8 ml    Physical Exam: Affect appropriate Thin black male HEENT: normal Neck supple with no adenopathy JVP normal no bruits no thyromegaly Lungs clear with no wheezing and good diaphragmatic motion Heart:  S1/S2 AR  murmur, no rub, gallop or click PMI normal Abdomen: benighn, BS positve, no tenderness, no AAA no bruit.  No HSM or HJR Distal pulses intact with no bruits No edema Neuro non-focal Skin warm and dry No muscular weakness  Lab Results: Basic Metabolic Panel:  Basename 04/13/12 0222 04/12/12 2129 04/12/12 1224  NA 145 -- 144  K 4.1 -- 4.4  CL 114* -- 113*  CO2 22 -- 19  GLUCOSE 89 -- 135*  BUN 43* -- 39*  CREATININE 2.22* -- 2.07*  CALCIUM 8.5 -- 8.4  MG -- 1.9 --  PHOS -- -- --   Liver Function Tests:  Basename 04/12/12 1224  AST 31  ALT 40  ALKPHOS 109  BILITOT 1.0  PROT 6.1  ALBUMIN 3.2*   No results found for this basename: LIPASE:2,AMYLASE:2 in the last 72 hours CBC:  Basename 04/12/12 1224  WBC 3.2*  NEUTROABS 2.3  HGB 11.0*  HCT 32.8*  MCV 87.5  PLT 111*   Cardiac Enzymes:  Basename 04/13/12 0221 04/12/12 2126 04/12/12 1224  CKTOTAL -- -- --  CKMB -- -- --  CKMBINDEX -- -- --  TROPONINI <0.30 <0.30 <0.30   BNP: No components found with this basename: POCBNP:3 D-Dimer: No results found for this basename: DDIMER:2 in the last 72 hours Hemoglobin  A1C: No results found for this basename: HGBA1C in the last 72 hours Fasting Lipid Panel:  Basename 04/13/12 0222  CHOL 138  HDL 60  LDLCALC 64  TRIG 68  CHOLHDL 2.3  LDLDIRECT --   Thyroid Function Tests:  Basename 04/12/12 1656  TSH 2.605  T4TOTAL --  T3FREE --  THYROIDAB --   Anemia Panel: No results found for this basename: VITAMINB12,FOLATE,FERRITIN,TIBC,IRON,RETICCTPCT in the last 72 hours  Imaging: Dg Chest 2 View  04/12/2012  *RADIOLOGY REPORT*  Clinical Data: Shortness of breath.  Lower extremity swelling.  CHEST - 2 VIEW  Comparison: 12/26/2009  Findings: Small bilateral pleural effusions, left larger than right.  Bibasilar atelectasis, also worse on the left.  Mild cardiomegaly.  Hyperinflation of the lungs.  No acute bony abnormality.  IMPRESSION: Bilateral effusions and bibasilar atelectasis, left greater than right.  Cardiomegaly, COPD.   Original Report Authenticated By: Charlett Nose, M.D.     Cardiac Studies:  ECG:  SR LAD LVH frequent ectopy  Telemetry:  SR with frequent ectopy  Echo: pending  Medications:     . carvedilol  3.125 mg Oral BID WC  . furosemide  40 mg Intravenous Q8H  . sodium chloride  3 mL Intravenous Q12H       . nesiritide (NATRECOR) infusion 0.01 mcg/kg/min (04/12/12 1748)    Assessment/Plan:  1. CHF: Acute systolic (EF 40% on last echo) with volume overload on exam. No chest pain. Significant aortic insufficiency may be contributing.  - Lasix 40 mg IV every 8 hours  - Given CKD and possible severe AI, will use nesiritide gtt for IV afterload reduction, likely transition eventually to hydralazine/nitrates.  - Follow creatinine closely with diuresis.  - Echocardiogram  - Ideally, he would eventually have LHC for coronary assessment given wall motion abnormalities on prior echo, but he has been reluctant to agree to this in the past. Cardiac enzymes negative today.  2. Aortic insufficiency: Diastolic murmur on exam but pulse  pressure is not wide. Moderate to severe on prior imaging (none recently). As above, repeating echo and will use nesiritide for IV afterload reduction.  3. CKD: Creatinine appears to be at this baseline. Will need to watch closely with diuresis (may improve as renal venous pressure is decreased).  4. Rhythm: Frequent PVCs and PACs but predominantly NSR on telemetry. Will start low dose Coreg at 3.125 mg bid.  5. Cramps in legs  Flexeril and PRN valium pulses and lytes are fine  Change to hydralazine and nitrates in am   Charlton Haws 04/13/2012, 9:23 AM

## 2012-04-14 LAB — BASIC METABOLIC PANEL
BUN: 49 mg/dL — ABNORMAL HIGH (ref 6–23)
Calcium: 8.6 mg/dL (ref 8.4–10.5)
GFR calc Af Amer: 28 mL/min — ABNORMAL LOW (ref >90)
GFR calc non Af Amer: 25 mL/min — ABNORMAL LOW (ref >90)
Glucose, Bld: 88 mg/dL (ref 70–99)
Sodium: 143 mEq/L (ref 135–145)

## 2012-04-14 MED ORDER — ISOSORBIDE MONONITRATE 15 MG HALF TABLET
15.0000 mg | ORAL_TABLET | Freq: Every day | ORAL | Status: DC
  Administered 2012-04-14 – 2012-04-16 (×3): 15 mg via ORAL
  Filled 2012-04-14 (×4): qty 1

## 2012-04-14 MED ORDER — ISOSORBIDE DINITRATE 5 MG PO TABS
5.0000 mg | ORAL_TABLET | Freq: Two times a day (BID) | ORAL | Status: DC
  Filled 2012-04-14 (×2): qty 1

## 2012-04-14 MED ORDER — HYDRALAZINE HCL 10 MG PO TABS
10.0000 mg | ORAL_TABLET | Freq: Three times a day (TID) | ORAL | Status: DC
  Administered 2012-04-14 – 2012-04-16 (×5): 10 mg via ORAL
  Filled 2012-04-14 (×12): qty 1

## 2012-04-14 NOTE — Progress Notes (Signed)
Subjective:  The patient states that he feels better.  He has less dyspnea.  He denies any chest pain. Telemetry shows frequent PVCs and runs of idioventricular rhythm.  Patient states that he has had premature beats for a long time.  Objective:  Vital Signs in the last 24 hours: Temp:  [97.3 F (36.3 C)-97.6 F (36.4 C)] 97.5 F (36.4 C) (01/25 0825) Pulse Rate:  [47-108] 97  (01/25 0830) Resp:  [15-27] 17  (01/25 0830) BP: (93-117)/(52-77) 116/73 mmHg (01/25 0830) SpO2:  [92 %-100 %] 98 % (01/25 0830) Weight:  [153 lb 10.6 oz (69.7 kg)] 153 lb 10.6 oz (69.7 kg) (01/25 0354)  Intake/Output from previous day: 01/24 0701 - 01/25 0700 In: 1243.3 [P.O.:840; I.V.:403.3] Out: 3450 [Urine:3450] Intake/Output from this shift: Total I/O In: 154.2 [P.O.:120; I.V.:34.2] Out: 400 [Urine:400]     . carvedilol  3.125 mg Oral BID WC  . furosemide  40 mg Intravenous Q8H  . sodium chloride  3 mL Intravenous Q12H      . nesiritide (NATRECOR) infusion 0.01 mcg/kg/min (04/14/12 0357)    Physical Exam: The patient appears to be in no distress.  Head and neck exam reveals that the pupils are equal and reactive.  The extraocular movements are full.  There is no scleral icterus.  Mouth and pharynx are benign.  No lymphadenopathy.  No carotid bruits.  The jugular venous pressure is normal.  Thyroid is not enlarged or tender.  Chest is clear to percussion and auscultation.  No rales or rhonchi.  Expansion of the chest is symmetrical.  Heart reveals very soft murmur of aortic insufficiency.  No S3 gallop  The abdomen is soft and nontender.  Bowel sounds are normoactive.  There is no hepatosplenomegaly or mass.  There are no abdominal bruits.  Extremities reveal 2+ edema of the left foot and 1+ edema of the right foot.  Neurologic exam is normal strength and no lateralizing weakness.  No sensory deficits.  Integument reveals no rash  Lab Results:  Basename 04/12/12 1224  WBC 3.2*    HGB 11.0*  PLT 111*    Basename 04/14/12 0525 04/13/12 0222  NA 143 145  K 4.1 4.1  CL 107 114*  CO2 25 22  GLUCOSE 88 89  BUN 49* 43*  CREATININE 2.41* 2.22*    Basename 04/13/12 0855 04/13/12 0221  TROPONINI <0.30 <0.30   Hepatic Function Panel  Basename 04/12/12 1224  PROT 6.1  ALBUMIN 3.2*  AST 31  ALT 40  ALKPHOS 109  BILITOT 1.0  BILIDIR --  IBILI --    Basename 04/13/12 0222  CHOL 138   No results found for this basename: PROTIME in the last 72 hours  Imaging: Dg Chest 2 View  04/12/2012  *RADIOLOGY REPORT*  Clinical Data: Shortness of breath.  Lower extremity swelling.  CHEST - 2 VIEW  Comparison: 12/26/2009  Findings: Small bilateral pleural effusions, left larger than right.  Bibasilar atelectasis, also worse on the left.  Mild cardiomegaly.  Hyperinflation of the lungs.  No acute bony abnormality.  IMPRESSION: Bilateral effusions and bibasilar atelectasis, left greater than right.  Cardiomegaly, COPD.   Original Report Authenticated By: Charlett Nose, M.D.     Cardiac Studies: Telemetry shows normal sinus rhythm with frequent PACs Two-dimensional echocardiogram done on 04/13/12 demonstrates:  Study Conclusions  - Left ventricle: The cavity size was severely dilated. Wall thickness was increased in a pattern of mild LVH. The estimated ejection fraction was 15%. Diffuse hypokinesis. -  Aortic valve: Mild to moderate regurgitation. - Mitral valve: Moderate regurgitation. - Left atrium: The atrium was mildly dilated. - Right ventricle: The cavity size was mildly dilated. Wall thickness was normal. - Right atrium: The atrium was mildly dilated. - Atrial septum: No defect or patent foramen ovale was identified. - Tricuspid valve: Moderate regurgitation. - Pericardium, extracardiac: There was a left pleural effusion. Transthoracic echocardiography. M-mode, complete 2D, spectral Doppler, and color Doppler. Height: Height: 180.3cm. Height: 71in. Weight:  Weight: 69.9kg. Weight: 153.8lb. Body mass index: BMI: 21.5kg/m^2. Body surface area: BSA: 1.31m^2. Blood pressure: 101/63. Patient status: Inpatient. Location: Bedside.    Assessment/Plan:  Patient Active Hospital Problem List: Acute on chronic systolic CHF, NYHA class 3 (04/12/2012)   Assessment: Since previous echocardiogram there has been a significant decrease in ejection fraction down to a present level of 15%    Plan: Carvedilol, afterload reduction with nitrates and hydralazine.  We may be limited in this because of his borderline low blood pressures.  Will start with just 10 mg of hydralazine every 8 hours.  Aortic insufficiency (02/22/2012)   Assessment: Echocardiogram yesterday shows only mild to moderate aortic insufficiency   PVCs (premature ventricular contractions) ()   Assessment: Chronic, asymptomatic and probably related to his underlying severe left ventricular dysfunction    Plan: Follow electrolytes carefully  LV dysfunction ()   Assessment: Ejection fraction 15%    Plan: Carvedilol, afterload reduction.  Weight is down 3 pounds since admission.  CKD (chronic kidney disease), stage III ()   Assessment: Renal function and potassium stable after nesiritide yesterday.  Patient does not want cardiac catheterization because of concern over his renal dysfunction and contrast.    Plan: We'll stop nesiritide today and switch to hydralazine and nitrates.  Continue Lasix.   LOS: 2 days    Cassell Clement 04/14/2012, 10:27 AM

## 2012-04-14 NOTE — Progress Notes (Signed)
Pt's heart rhythm on monitor is bigeminy and trigeminy; PA [place] notified; will continue to monitor

## 2012-04-15 ENCOUNTER — Inpatient Hospital Stay (HOSPITAL_COMMUNITY): Payer: Medicare Other

## 2012-04-15 LAB — CBC WITH DIFFERENTIAL/PLATELET
Basophils Absolute: 0 10*3/uL (ref 0.0–0.1)
Basophils Relative: 0 % (ref 0–1)
Eosinophils Absolute: 0 10*3/uL (ref 0.0–0.7)
MCH: 28.8 pg (ref 26.0–34.0)
MCHC: 32.8 g/dL (ref 30.0–36.0)
Monocytes Relative: 8 % (ref 3–12)
Neutrophils Relative %: 69 % (ref 43–77)
Platelets: 119 10*3/uL — ABNORMAL LOW (ref 150–400)
RDW: 14.4 % (ref 11.5–15.5)

## 2012-04-15 LAB — BASIC METABOLIC PANEL
BUN: 57 mg/dL — ABNORMAL HIGH (ref 6–23)
BUN: 61 mg/dL — ABNORMAL HIGH (ref 6–23)
Calcium: 8.5 mg/dL (ref 8.4–10.5)
GFR calc Af Amer: 27 mL/min — ABNORMAL LOW (ref >90)
GFR calc Af Amer: 27 mL/min — ABNORMAL LOW (ref >90)
GFR calc non Af Amer: 23 mL/min — ABNORMAL LOW (ref >90)
GFR calc non Af Amer: 24 mL/min — ABNORMAL LOW (ref >90)
Glucose, Bld: 103 mg/dL — ABNORMAL HIGH (ref 70–99)
Potassium: 4.2 mEq/L (ref 3.5–5.1)
Sodium: 136 mEq/L (ref 135–145)

## 2012-04-15 MED ORDER — FUROSEMIDE 40 MG PO TABS
40.0000 mg | ORAL_TABLET | Freq: Two times a day (BID) | ORAL | Status: DC
  Administered 2012-04-15 – 2012-04-16 (×3): 40 mg via ORAL
  Filled 2012-04-15 (×9): qty 1

## 2012-04-15 NOTE — Progress Notes (Signed)
At 2131 pt's BP was 105/62 and pt was given his medication as ordered. At 2354 bp was rechecked and was 96/55, this was confirmed manually.  Pt denies any new symptoms except for intermittent muscle cramping.  MD notified and Dr. Mayford Knife advised to hold 0400 Lasix, give 0600 Hydralazine only if SBP is >100 and to have a BMET drawn this morning.  Will continue to monitor.

## 2012-04-15 NOTE — Progress Notes (Signed)
Subjective:  The patient states that he feels better.  He has less dyspnea.  He denies any chest pain. Telemetry shows frequent PVCs and runs of idioventricular rhythm.  Patient states that he has had premature beats for a long time. His weight is down 9 lb since admission.  Objective:  Vital Signs in the last 24 hours: Temp:  [97.3 F (36.3 C)-97.6 F (36.4 C)] 97.3 F (36.3 C) (01/26 0432) Pulse Rate:  [32-84] 32  (01/26 0511) Resp:  [18-26] 24  (01/26 0511) BP: (94-114)/(45-71) 114/70 mmHg (01/26 0511) SpO2:  [97 %-100 %] 100 % (01/26 0511) Weight:  [147 lb 4.3 oz (66.8 kg)] 147 lb 4.3 oz (66.8 kg) (01/26 0432)  Intake/Output from previous day: 01/25 0701 - 01/26 0700 In: 739.7 [P.O.:480; I.V.:259.7] Out: 3800 [Urine:3800] Intake/Output from this shift:       . carvedilol  3.125 mg Oral BID WC  . furosemide  40 mg Oral BID  . hydrALAZINE  10 mg Oral Q8H  . isosorbide mononitrate  15 mg Oral Daily  . sodium chloride  3 mL Intravenous Q12H      Physical Exam: The patient appears to be in no distress.  Head and neck exam reveals that the pupils are equal and reactive.  The extraocular movements are full.  There is no scleral icterus.  Mouth and pharynx are benign.  No lymphadenopathy.  No carotid bruits.  The jugular venous pressure is normal.  Thyroid is not enlarged or tender.  Chest is clear to percussion and auscultation.  No rales or rhonchi.  Expansion of the chest is symmetrical.  Heart reveals very soft murmur of aortic insufficiency.  No S3 gallop  The abdomen is soft and nontender.  Bowel sounds are normoactive.  There is no hepatosplenomegaly or mass.  There are no abdominal bruits.  Extremities reveal 2+ edema of the left foot and 1+ edema of the right foot.  Neurologic exam is normal strength and no lateralizing weakness.  No sensory deficits.  Integument reveals no rash  Lab Results:  Basename 04/12/12 1224  WBC 3.2*  HGB 11.0*  PLT 111*     Basename 04/15/12 0042 04/14/12 0525  NA 137 143  K 3.8 4.1  CL 100 107  CO2 26 25  GLUCOSE 103* 88  BUN 57* 49*  CREATININE 2.49* 2.41*    Basename 04/13/12 0855 04/13/12 0221  TROPONINI <0.30 <0.30   Hepatic Function Panel  Basename 04/12/12 1224  PROT 6.1  ALBUMIN 3.2*  AST 31  ALT 40  ALKPHOS 109  BILITOT 1.0  BILIDIR --  IBILI --    Basename 04/13/12 0222  CHOL 138   No results found for this basename: PROTIME in the last 72 hours  Imaging: No results found.  Cardiac Studies: Telemetry shows normal sinus rhythm with frequent PACs Two-dimensional echocardiogram done on 04/13/12 demonstrates:  Study Conclusions  - Left ventricle: The cavity size was severely dilated. Wall thickness was increased in a pattern of mild LVH. The estimated ejection fraction was 15%. Diffuse hypokinesis. - Aortic valve: Mild to moderate regurgitation. - Mitral valve: Moderate regurgitation. - Left atrium: The atrium was mildly dilated. - Right ventricle: The cavity size was mildly dilated. Wall thickness was normal. - Right atrium: The atrium was mildly dilated. - Atrial septum: No defect or patent foramen ovale was identified. - Tricuspid valve: Moderate regurgitation. - Pericardium, extracardiac: There was a left pleural effusion. Transthoracic echocardiography. M-mode, complete 2D, spectral Doppler, and color  Doppler. Height: Height: 180.3cm. Height: 71in. Weight: Weight: 69.9kg. Weight: 153.8lb. Body mass index: BMI: 21.5kg/m^2. Body surface area: BSA: 1.51m^2. Blood pressure: 101/63. Patient status: Inpatient. Location: Bedside.    Assessment/Plan:  Patient Active Hospital Problem List: Acute on chronic systolic CHF, NYHA class 3 (04/12/2012)   Assessment: Since previous echocardiogram there has been a significant decrease in ejection fraction down to a present level of 15%    Plan: Carvedilol, afterload reduction with nitrates and hydralazine.  We may be  limited in this because of his borderline low blood pressures.  Will start with just 10 mg of hydralazine every 8 hours. Last night lasix was held. Aortic insufficiency (02/22/2012)   Assessment: Echocardiogram yesterday shows only mild to moderate aortic insufficiency   PVCs (premature ventricular contractions) ()   Assessment: Chronic, asymptomatic and probably related to his underlying severe left ventricular dysfunction    Plan: Follow electrolytes carefully  LV dysfunction ()   Assessment: Ejection fraction 15%    Plan: Carvedilol, afterload reduction.  Weight is down 9 lb since admission. CKD (chronic kidney disease), stage III ()   Assessment: Creatinine trending up.  Will reduce lasix today.   Plan: Recheck chest xray today. Continue lower dose of lasix orally, continue low dose imdur and hydralazine.   LOS: 3 days    Cassell Clement 04/15/2012, 9:30 AM

## 2012-04-16 ENCOUNTER — Inpatient Hospital Stay (HOSPITAL_COMMUNITY): Payer: Medicare Other

## 2012-04-16 ENCOUNTER — Encounter (HOSPITAL_COMMUNITY): Payer: Self-pay | Admitting: Sports Medicine

## 2012-04-16 LAB — URINALYSIS, ROUTINE W REFLEX MICROSCOPIC
Bilirubin Urine: NEGATIVE
Leukocytes, UA: NEGATIVE
Nitrite: NEGATIVE
Specific Gravity, Urine: 1.008 (ref 1.005–1.030)
pH: 6 (ref 5.0–8.0)

## 2012-04-16 LAB — COMPREHENSIVE METABOLIC PANEL
AST: 34 U/L (ref 0–37)
Albumin: 3.2 g/dL — ABNORMAL LOW (ref 3.5–5.2)
Chloride: 100 mEq/L (ref 96–112)
Creatinine, Ser: 2.42 mg/dL — ABNORMAL HIGH (ref 0.50–1.35)
Sodium: 137 mEq/L (ref 135–145)
Total Bilirubin: 0.5 mg/dL (ref 0.3–1.2)

## 2012-04-16 LAB — PROTEIN / CREATININE RATIO, URINE
Protein Creatinine Ratio: 0.08 (ref 0.00–0.15)
Total Protein, Urine: 4.6 mg/dL

## 2012-04-16 NOTE — Progress Notes (Signed)
Patient ambulated 500 ft using front wheel walker w/out difficulty. Returned to bedside chair w/out incidence. Call bell and family near. Will monitor. Mamie Levers

## 2012-04-16 NOTE — Consult Note (Signed)
[facility] Consult Note    Patient name: [name] Medical record number: [id number] Date of birth: 1934-06-19 Age: 78 y.o. Gender: male  Primary Care Provider: [name], MD Primary OP Nephrologist:  Sees Dr. Hyman Hopes, saw him last April 2013 due to return this year Primary Hospital Service: [facility] [facility] (Dr. [place] - Primary) Service Requesting Consult: Same   Chief Complaint: Acute on Chronic Kidney Failure    HPI  [name] is a 78 y.o. year old male who presented on 04/12/2012 with 2 day history of dyspnea and PND.  Reported 2 week history of B ankle edema L worse than R.  Reported no dyspnea on exertion and no recent weight gain.  He was admitted by cardiology and was found to have a markedly reduced EF however his respiratory symptoms responded well to diuresis.  He was noted to have an elevated Cr at the time of admission of 2.0 which has risen to ~ 2.5 following diuresis.    He has a history significant for Non-hodgkin's lymphoma s/p chemotherapy and lymph node dissection in 2002 / 2003. He is unable to recall what medications were included in his chemo regimen.  He is followed by [facility] for his kidneys and currently denies any other medical problems.  He denies taking any medications on a regular basis and denies use of NSAIDs, ASA, or other Rx medications.    Patient feels back to baseline, no orthopnea.  Very interested in being discharged, just got transferred out of 2600.  Seems to be very particular about his treatment regimen, saying that he is on too much lasix because wasn't talking any at home and he continues to refuse dye related procedures even with counseling. Were able to find out that he does see Dr. Hyman Hopes at [facility]  ROS   Constitutional Feels good, wanting to go home, reports some cramping since being here, non at home  Infectious Denies fevers, chills  Resp Denies cough, congestion, sputum production,   Cardiac Denies  chest pain, palpitations, dyspnea, orthopnea, PND at this time (+ dyspnea, PND at time of admission)  GI No N/V  GU No frequency, hesitancy, urgency, dysuria  MSK Reports spraining L ankle ~1 year ago and has had swelling on and off since that time worsen on the L  Skin No new rashes               HISTORY:  PMHx:  Past Medical History  Diagnosis Date  . Aortic insufficiency     Moderate to Severe  . Atrial flutter     resolved  . Anemia   . PVCs (premature ventricular contractions)   . LV dysfunction   . Hydronephrosis, left   . CKD (chronic kidney disease), stage III   . Non Hodgkin's lymphoma     resolved >10 yr ago  . CHF (congestive heart failure)     PSHx: Past Surgical History  Procedure Date  . Lymph node dissection     Groin  . Inguinal hernia repair     Social Hx: History   Social History Narrative  . No narrative on file    Family Hx: Family History  Problem Relation Age of Onset  . Prostate cancer Father     Allergies: Allergies  Allergen Reactions  . Contrast Media (Iodinated Diagnostic Agents) Other (See Comments)    Patient does not want dye because it is hard on his kidneys  . Prednisone Rash    Home Medications:  Prescriptions prior to admission  Medication Sig Dispense Refill  . triamcinolone cream (KENALOG) 0.1 % Apply 1 application topically daily as needed. For rash                  OBJECTIVE  Vitals: Temp:  [97.2 F (36.2 C)-97.8 F (36.6 C)] 97.6 F (36.4 C) (01/27 1329) Pulse Rate:  [57-116] 80  (01/27 1329) Resp:  [15-20] 18  (01/27 1329) BP: (99-120)/(60-88) 104/88 mmHg (01/27 1436) SpO2:  [98 %-100 %] 100 % (01/27 1329)  Weight: Wt Readings from Last 3 Encounters:  04/15/12 147 lb 4.3 oz (66.8 kg)  02/22/12 151 lb 12.8 oz (68.856 kg)    I&Os: Yesterday: 01/26 0701 - 01/27 0700 In: 273 [P.O.:120; I.V.:153] Out: 2575 [Urine:2575] This shift: Total I/O In: 643 [P.O.:640; I.V.:3] Out: 1150  [Urine:1150]   PE: GENERAL:  Adult, thin, elderly male.  Examined in [facility].  In no discomfort; norespiratory distress.   PSYCH: Alert and appropriately interactive; Insight:Good  Alert, oriented, thought content appropriate H&N: EOMi, MMM, no scleral icterus, no JVD, trachea midline THORAX: HEART: irregularlly irregular, s1/s2, systolic murmur LUNGS: fine, faint crackles in B bases  ABDOMEN:  +BS, soft, nontender EXTREMITIES: Moves all 4 extremities spontaneously, warm well perfused, trace edema on R LE; 2+/4 edema around L ankle, trace pretibial edema.  DP and PT pulses 2/4.  No edema  LABS  Lab 04/16/12 0905 04/16/12 0430 04/15/12 2258 04/15/12 0042 04/14/12 1045 04/14/12 0525 04/13/12 0222 04/12/12 2129 04/12/12 1224  NA -- 137 136 137 -- 143 145 -- 144  K 4.3 5.6* 4.2 3.8 -- 4.1 4.1 -- 4.4  CL -- 100 97 100 -- 107 114* -- 113*  CO2 -- 26 27 26  -- 25 22 -- 19  BUN -- 60* 61* 57* -- 49* 43* -- 39*  CREATININE -- 2.42* 2.52* 2.49* -- 2.41* 2.22* -- 2.07*  CALCIUM -- 8.6 8.3* 8.5 -- 8.6 8.5 -- 8.4  GLUCOSE -- 82 107* 103* -- 88 89 -- 135*  MG -- -- -- -- 1.6 -- -- 1.9 --  PHOS -- -- -- -- -- -- -- -- --  PROT -- 6.4 -- -- -- -- -- -- 6.1  ALBUMIN -- 3.2* -- -- -- -- -- -- 3.2*    Lab 04/15/12 1011 04/12/12 1224  WBC 3.6* 3.2*  HGB 11.8* 11.0*  HCT 36.0* 32.8*  PLT 119* 111*  RDW 14.4 14.9  MCV 87.8 87.5  MCHC 32.8 33.5    Lab 04/16/12 0430 04/12/12 1224  ALT 28 40  AST 34 31  ALKPHOS 94 109  BILITOT 0.5 1.0  BILIDIR -- --   No results found for this basename: AMMONIA:3,URICACID:3 in the last 8760 hours No results found for this basename: PTH:3 in the last 8760 hours  Urine Studies Urinalysis  none   MICRO: None  IMAGING: Dg Chest 2 View  04/15/2012  *RADIOLOGY REPORT*  Clinical Data: Follow-up congestive heart failure  CHEST - 2 VIEW  Comparison: 04/12/2012  Findings: Cardiac silhouette upper normal in size.  Vascular pattern is normal.  Tiny right  effusion.  Small to moderate left effusion.  There is also bilateral lower lobe atelectasis.  IMPRESSION: No significant change from prior study with bibasilar atelectasis and effusions.   Original Report Authenticated By: Esperanza Heir, M.D.    2D ECHO 04/13/12 - LV: Dilated, LVH, EF 15% with diffuse hypokinesis - Aortic Valve: Mild to Mod Regurg - Mitral Valve: Mod regurg -  Tricuspid: Mod regurg - RV: dilated but normal function  12/06/2007 - Renal US : 2 kidneys, hypoechogenicity  07/14/10 - Renal Scintiangiography with Flow and Function 1. Bilateral hydronephrosis with flat renogram curves. The left kidney demonstrates no appreciable response to Lasix and the right kidney demonstrates a good response to Lasix.  2. Relative functioning renal mass is 53% on the left and 47% on the right.   Medications:       . carvedilol  3.125 mg Oral BID WC  . furosemide  40 mg Oral BID  . hydrALAZINE  10 mg Oral Q8H  . isosorbide mononitrate  15 mg Oral Daily  . sodium chloride  3 mL Intravenous Q12H              Assessment & Plan  LOS: 74 78 y.o. year old male with acute on chronic combined CHF with acute on Chronic Renal insuffiencey who presented with acute dyspnea and PND.  He has undergone an ~ 10# diuresis and is significantly improved from a respiratory standpoint.  His history is significant for Non-Hodgkin's Lymphoma s/p chemotherapy 12 years ago.     # Acute Kidney Injury on Chronic CKD (Stage III): followed as OP by Dr. Hyman Hopes.  Cr Baseline of 1.7-1.8 in 2007 and 2011; presented with Cr of 2.07.  Cr has climbed with Diuresis 2.42.  Likely reflects hemodilution revealing his true/new/current baseline Cr of ~2.4.    - Repeat Renal US given findings from Renal Nuclear Study; would expect to see size discrepancies if L kidney non-responsive to Lasix  - UA, Protein:Cr ratio  - Cr likely at baseline; will need OP follow up with nephrology- arranged for 2/24 with Dr. Hyman Hopes at 11:30  - Control  Risk factors for chronic renal disease  - Has shown good response to lasix and sx improved; ~10# weight loss to new presumed Dry Weight  - Continue lasix 40mg  po BID until followup. May not need that much since wasn't talking any prior to admit.  I doubt he will take 40 BID   # Acute on Chronic Combined Systolic and Diastolic CHF, NYHA Class 3 with EF of 15%: followed by [facility]. Pending Myoview, refusing Cath due to contrast. # Aortic Valve Insufficiency: Followed by [facility], valve replacement not indicated    - Per [facility], recommending further OP workup including Nuclear Study as pt not agreeable to cardiac Cath  # HTN: not on meds at time of admission:  Agree with Coreg, imdur and Lasix.  He may not tolerate this much med, and again, doubt he will be compliant  ACEi may be indicated if evidence of proteinuria/microalbuminuria however hold at this time until established with good OP follow up as he will need diligent titration and hold parameters to prevent further worsening of his kidney disease- not now  # Hx of non-Hodgkin's lymphoma:  # Anemia: Hb in Am, consider Fe++ studies  # Metabolic:  Will check PTH and PO4 levels:  --- FEN  *SL IVFs -Cardiac Diet   Andrena Mews, DO [facility] Resident - PGY-1 04/16/2012 2:41 PM  Patient seen and examined, agree with above note with above modifications.  Pleasant 78 year old BM with history of CKD followed by Dr. Hyman Hopes.  Came in for orthopnea and PND on no meds for his cardiac disease.  He has responded well to diuresis but had a small bump in his creatinine which I think is more related to diuresis and BP than to actual  worsening of renal function.  Fortunately creatinine has been stable the last 2 days.  Be careful with sending out on too much meds because he could drop his pressure and likely will not take all of it.  I have nothing else to add at this point.  If creatinine is stable tomorrow , I think OK for discharge.  See  above for follow up appt with Dr. Hyman Hopes.  Annie Sable, MD 04/16/2012

## 2012-04-16 NOTE — Progress Notes (Signed)
Received patient to 2003. Assessed per flow sheet. Denies chest pain or shortness of breath at this time. VSS. Call bell near. Mamie Levers

## 2012-04-16 NOTE — Progress Notes (Signed)
Nurse called report to [place] on 2000 for transfer.  Pt was transferred via wheelchair on room air without questions or concerns prior to transport.  Pt vital signs are documented in doc flow sheet.  Pt instructed nurse that he would inform family of transfer.  Nurse communicated pt K level from 0430 this am, and that an additional level was drawn however results are not available at this time.  Pt belongings were carried with pt to new room.

## 2012-04-16 NOTE — Care Management Note (Signed)
    Page 1 of 2   04/17/2012     3:12:14 PM   CARE MANAGEMENT NOTE 04/17/2012  Patient:  [name], [name]   Account Number:  [id number]  Date Initiated:  04/16/2012  Documentation initiated by:  Porshia Blizzard  Subjective/Objective Assessment:   PT ADM WITH ACUTE ON CHRONIC CHF, AORTIC INSUFFICIENCY ON 04/12/12.  PT INDEPENDENT PRIOR TO ADMISSION.     Action/Plan:   WILL FOLLOW FOR HOME NEEDS AS PT PROGRESSES.  WOULD RECOMMEND HH FOLLOW UP FOR DISEASE MANAGEMENT.   Anticipated DC Date:  04/18/2012   Anticipated DC Plan:  HOME W HOME HEALTH SERVICES      DC Planning Services  CM consult      [facility] Choice  HOME HEALTH   Choice offered to / List presented to:  C-1 Patient        HH arranged  HH-1 RN  HH-10 DISEASE MANAGEMENT      HH agency  [facility]   Status of service:  Completed, signed off Medicare Important Message given?   (If response is "NO", the following Medicare IM given date fields will be blank) Date Medicare IM given:   Date Additional Medicare IM given:    Discharge Disposition:  HOME W HOME HEALTH SERVICES  Per UR Regulation:  Reviewed for med. necessity/level of care/duration of stay  If discussed at Long Length of Stay Meetings, dates discussed:    Comments:  04/17/12 Dorean Hiebert,RN,BSN [phone] PT FOR DC HOME TODAY.  PT IS AGREEABLE TO [facility] FOR CHF FOLLOW UP.  REFERRAL TO [facility], PER PT CHOICE.  START OF CARE 24-48H POST DC DATE.

## 2012-04-16 NOTE — Progress Notes (Signed)
Nurse contacted MD to inform of am K level of 5.6.  MD ordered STAT K level to confirm.  Nurse will continue to monitor

## 2012-04-16 NOTE — Progress Notes (Signed)
TELEMETRY: Reviewed telemetry pt in NSR with frequent PVCs, NSVT: Filed Vitals:   04/16/12 0342 04/16/12 0526 04/16/12 0749 04/16/12 0758  BP: 101/75 120/68 109/69 109/69  Pulse: 116  70 72  Temp: 97.8 F (36.6 C)  97.3 F (36.3 C)   TempSrc: Oral  Oral   Resp:   15   Height:      Weight:      SpO2: 98%  100%     Intake/Output Summary (Last 24 hours) at 04/16/12 0852 Last data filed at 04/16/12 0750  Gross per 24 hour  Intake    263 ml  Output   2775 ml  Net  -2512 ml    SUBJECTIVE Patient reports he is feeling much better. Denies dyspnea, chest pain, dizzyness or lightheadedness. Swelling resolved. Reports fairly acute dyspnea 2 days before admission.  LABS: Basic Metabolic Panel:  Basename 04/16/12 0430 04/15/12 2258 04/14/12 1045  NA 137 136 --  K 5.6* 4.2 --  CL 100 97 --  CO2 26 27 --  GLUCOSE 82 107* --  BUN 60* 61* --  CREATININE 2.42* 2.52* --  CALCIUM 8.6 8.3* --  MG -- -- 1.6  PHOS -- -- --   Liver Function Tests:  Basename 04/16/12 0430  AST 34  ALT 28  ALKPHOS 94  BILITOT 0.5  PROT 6.4  ALBUMIN 3.2*   CBC:  Basename 04/15/12 1011  WBC 3.6*  NEUTROABS 2.5  HGB 11.8*  HCT 36.0*  MCV 87.8  PLT 119*   Cardiac Enzymes:  Basename 04/13/12 0855  CKTOTAL --  CKMB --  CKMBINDEX --  TROPONINI <0.30   BNP: 18809 TSH: 2.605  Radiology/Studies:  Dg Chest 2 View  04/15/2012  *RADIOLOGY REPORT*  Clinical Data: Follow-up congestive heart failure  CHEST - 2 VIEW  Comparison: 04/12/2012  Findings: Cardiac silhouette upper normal in size.  Vascular pattern is normal.  Tiny right effusion.  Small to moderate left effusion.  There is also bilateral lower lobe atelectasis.  IMPRESSION: No significant change from prior study with bibasilar atelectasis and effusions.   Original Report Authenticated By: Esperanza Heir, M.D.    Dg Chest 2 View  04/12/2012  *RADIOLOGY REPORT*  Clinical Data: Shortness of breath.  Lower extremity swelling.  CHEST - 2  VIEW  Comparison: 12/26/2009  Findings: Small bilateral pleural effusions, left larger than right.  Bibasilar atelectasis, also worse on the left.  Mild cardiomegaly.  Hyperinflation of the lungs.  No acute bony abnormality.  IMPRESSION: Bilateral effusions and bibasilar atelectasis, left greater than right.  Cardiomegaly, COPD.   Original Report Authenticated By: Charlett Nose, M.D.    ECG:13-Apr-2012 07:47:09 [facility]-26 ROUTINE RECORD Sinus rhythm with frequent Premature ventricular complexes T wave abnormality, consider lateral ischemia Non-specific intra-ventricular conduction delay, prolonged QT  Transthoracic Echocardiography  Patient: [name], [name] MR #: [id number] Study Date: 04/13/2012 Gender: M Age: 78 Height: 180.3cm Weight: 69.9kg BSA: 1.67m^2 Pt. Status: Room: CD11C  PERFORMING [place] Theodore Demark SONOGRAPHER Mathews Argyle, RDCS, ARDMS cc:  ------------------------------------------------------------ LV EF: 15%  ------------------------------------------------------------ Indications: CHF - 428.0.  ------------------------------------------------------------ History: PMH: Dyspnea. Atrial flutter.  ------------------------------------------------------------ Study Conclusions  - Left ventricle: The cavity size was severely dilated. Wall thickness was increased in a pattern of mild LVH. The estimated ejection fraction was 15%. Diffuse hypokinesis. - Aortic valve: Mild to moderate regurgitation. - Mitral valve: Moderate regurgitation. - Left atrium: The atrium was mildly dilated. - Right ventricle: The cavity size was mildly dilated.  Wall thickness was normal. - Right atrium: The atrium was mildly dilated. - Atrial septum: No defect or patent foramen ovale was identified. - Tricuspid valve: Moderate regurgitation. - Pericardium, extracardiac: There was a left pleural effusion. Transthoracic echocardiography.  M-mode, complete 2D, spectral Doppler, and color Doppler. Height: Height: 180.3cm. Height: 71in. Weight: Weight: 69.9kg. Weight: 153.8lb. Body mass index: BMI: 21.5kg/m^2. Body surface area: BSA: 1.74m^2. Blood pressure: 101/63. Patient status: Inpatient. Location: Bedside.  ------------------------------------------------------------  ------------------------------------------------------------ Left ventricle: The cavity size was severely dilated. Wall thickness was increased in a pattern of mild LVH. The estimated ejection fraction was 15%. Diffuse hypokinesis.  ------------------------------------------------------------ Aortic valve: Trileaflet; mildly thickened leaflets. Doppler: Mild to moderate regurgitation.  ------------------------------------------------------------ Aorta: There was severe dilation of the sinus(es) of Valsalva.  ------------------------------------------------------------ Mitral valve: Structurally normal valve. Leaflet separation was normal. Doppler: Transvalvular velocity was within the normal range. There was no evidence for stenosis. Moderate regurgitation.  ------------------------------------------------------------ Left atrium: The atrium was mildly dilated.  ------------------------------------------------------------ Atrial septum: No defect or patent foramen ovale was identified.  ------------------------------------------------------------ Right ventricle: The cavity size was mildly dilated. Wall thickness was normal. Systolic function was normal.  ------------------------------------------------------------ Pulmonic valve: Doppler: Mild regurgitation.  ------------------------------------------------------------ Tricuspid valve: Doppler: Moderate regurgitation.  ------------------------------------------------------------ Right atrium: The atrium was mildly  dilated.  ------------------------------------------------------------ Pericardium: There was no pericardial effusion.  ------------------------------------------------------------ Systemic veins: Inferior vena cava: The vessel was dilated; the respirophasic diameter changes were blunted (< 50%); findings are consistent with elevated central venous pressure.  ------------------------------------------------------------ Pleura: There was a left pleural effusion.  ------------------------------------------------------------  2D measurements Normal Doppler measurements Normal Left ventricle Left ventricle LVID ED, 67 mm 43-52 Ea, med ann, 5.95 cm/s ------ chord, tiss DP PLAX Mitral valve LVID ES, 62.5 mm 23-38 Regurg alias 29.6 cm/s ------ chord, vel, PISA PLAX Max regurg 407 cm/s ------ FS, chord, 7 % >29 vel PLAX Regurg VTI 135 cm ------ LVPW, ED 12.3 mm ------ ERO, PISA 0.16 cm^2 ------ IVS/LVPW 0.96 <1.3 Regurg vol, 22 ml ------ ratio, ED PISA Vol ED, 191 ml ------ Tricuspid valve MOD1 Regurg peak 319 cm/s ------ Vol ES, 133 ml ------ vel MOD1 Peak RV-RA 41 mm ------ EF, MOD1 30 % ------ gradient, S Hg Vol index, 102 ml/m^2 ------ Right ventricle ED, MOD1 Sa vel, lat 20.1 cm/s ------ Vol index, 71 ml/m^2 ------ ann, tiss DP ES, MOD1 Pulmonic valve Ventricular septum Regurg vel, 107 cm/s ------ IVS, ED 11.8 mm ------ ED Aorta Root diam, 49 mm ------ ED Left atrium AP dim 27 mm ------ AP dim 1.45 cm/m^2 <2.2 index  ------------------------------------------------------------ Prepared and Electronically Authenticated by  Cassell Clement 2014-01-24T15:[id number]   PHYSICAL EXAM General: Well developed, thin, in no acute distress. Head: Normocephalic, atraumatic, sclera non-icteric, no xanthomas, nares are without discharge. Neck: Negative for carotid bruits. JVD not elevated. Lungs: Clear bilaterally to auscultation without wheezes, rales, or rhonchi.   Decreased BS left base. Breathing is unlabored. Heart: RRR S1 S2 with 1/6 diastolic murmur.  Abdomen: Soft, non-tender, non-distended with normoactive bowel sounds. No hepatomegaly. No rebound/guarding. No obvious abdominal masses. Msk:  Strength and tone appears normal for age. Extremities: trace right ankle edema.  Distal pedal pulses are 2+ and equal bilaterally. Neuro: Alert and oriented X 3. Moves all extremities spontaneously. Psych:  Responds to questions appropriately with a normal affect.  ASSESSMENT AND PLAN: 1. Acute on chronic systolic CHF. EF has dropped from 40-45% to 15% in 2 years. Previously refused medical Rx or cardiac cath. Clinically much better with  diuresis. Weight down 9 pounds. Will slowly titrate Coreg, nitrates, and hydralazine as BP allows. Not a candidate for ACEi or ARB due to renal function.Will transfer to telemetry today. Ambulate. Instructed on importance of sodium restriction and daily weights.  2. Acute on chronic renal failure. Exacerbated by low output with CHF. Creatinine a little better today with reduction in lasix dose. Patient very concerned about renal function. States Kidney problems started when he received contrast during management of non-Hodgkin's lymphoma. Refuses cardiac cath now. Will arrange outpatient myoview to evaluate whether or not he has ischemia as a cause of his cardiomyopathy. If not we can continue medical therapy. If myoview is abnormal would need to revisit cardiac cath issue. Will ask renal to see this admission.  3. Aortic insufficiency. Described as moderate to severe in past. Now mild to moderate. Also has moderate MR. I do not think valve replacement or repair indicated.  4. History of non-Hodgkin's lymphoma.  5. PVCs chronic.  Principal Problem:  *Acute combined systolic and diastolic CHF, NYHA class 3 Active Problems:  Aortic insufficiency  PVCs (premature ventricular contractions)  LV dysfunction  CKD (chronic kidney  disease), stage III    Signed, Breiana Stratmann [place] MD

## 2012-04-17 ENCOUNTER — Other Ambulatory Visit: Payer: Self-pay | Admitting: Nurse Practitioner

## 2012-04-17 ENCOUNTER — Encounter (HOSPITAL_COMMUNITY): Payer: Self-pay | Admitting: Nurse Practitioner

## 2012-04-17 ENCOUNTER — Telehealth: Payer: Self-pay | Admitting: Cardiology

## 2012-04-17 DIAGNOSIS — I5021 Acute systolic (congestive) heart failure: Secondary | ICD-10-CM

## 2012-04-17 DIAGNOSIS — I5022 Chronic systolic (congestive) heart failure: Secondary | ICD-10-CM

## 2012-04-17 DIAGNOSIS — I498 Other specified cardiac arrhythmias: Secondary | ICD-10-CM

## 2012-04-17 DIAGNOSIS — I340 Nonrheumatic mitral (valve) insufficiency: Secondary | ICD-10-CM

## 2012-04-17 DIAGNOSIS — N1330 Unspecified hydronephrosis: Secondary | ICD-10-CM | POA: Diagnosis present

## 2012-04-17 HISTORY — DX: Acute systolic (congestive) heart failure: I50.21

## 2012-04-17 LAB — RENAL FUNCTION PANEL
Albumin: 3.4 g/dL — ABNORMAL LOW (ref 3.5–5.2)
Calcium: 8.3 mg/dL — ABNORMAL LOW (ref 8.4–10.5)
Creatinine, Ser: 2.6 mg/dL — ABNORMAL HIGH (ref 0.50–1.35)
GFR calc non Af Amer: 22 mL/min — ABNORMAL LOW (ref >90)
Phosphorus: 4.8 mg/dL — ABNORMAL HIGH (ref 2.3–4.6)

## 2012-04-17 LAB — PARATHYROID HORMONE, INTACT (NO CA): PTH: 694.7 pg/mL — ABNORMAL HIGH (ref 14.0–72.0)

## 2012-04-17 MED ORDER — FUROSEMIDE 40 MG PO TABS
40.0000 mg | ORAL_TABLET | Freq: Every day | ORAL | Status: DC
  Filled 2012-04-17: qty 1

## 2012-04-17 MED ORDER — FUROSEMIDE 40 MG PO TABS: 40.0000 mg | ORAL_TABLET | Freq: Every day | ORAL | Status: DC

## 2012-04-17 MED ORDER — ISOSORBIDE MONONITRATE ER 30 MG PO TB24: 15.0000 mg | ORAL_TABLET | Freq: Every day | ORAL | Status: DC

## 2012-04-17 MED ORDER — NITROGLYCERIN 0.4 MG SL SUBL: 0.4000 mg | SUBLINGUAL_TABLET | SUBLINGUAL | Status: DC | PRN

## 2012-04-17 MED ORDER — CARVEDILOL 3.125 MG PO TABS: 3.1250 mg | ORAL_TABLET | Freq: Two times a day (BID) | ORAL | Status: DC

## 2012-04-17 MED ORDER — FUROSEMIDE 20 MG PO TABS
20.0000 mg | ORAL_TABLET | Freq: Every day | ORAL | Status: DC
  Filled 2012-04-17: qty 1

## 2012-04-17 NOTE — Discharge Summary (Signed)
Patient ID: [name],  MRN: [id number], DOB/AGE: 76-20-1936 76 y.o.  Admit date: 04/12/2012 Discharge date: 04/17/2012  Primary Care Provider: [name] Primary Cardiologist: P. [place], MD  Discharge Diagnoses Principal Problem:  *Acute systolic CHF (congestive heart failure)  **S/P diuresis this admission with net negative ov 10 Liters and reduction in weight from 156 lbs to   147 lbs.  **EF down to 15% by echo this admission.  Active Problems:  CKD (chronic kidney disease), stage III  Aortic insufficiency  **Mild to moderate by echo this admission.  PVCs (premature ventricular contractions)  Moderate mitral regurgitation  Bilateral hydronephrosis  **By renal u/s this admission.    History of Non-Hodgkins lymphoma  Allergies Allergies  Allergen Reactions  . Contrast Media (Iodinated Diagnostic Agents) Other (See Comments)    Patient does not want dye because it is hard on his kidneys  . Prednisone Rash   Procedures  2D Echocardiogram 04/13/2012  Study Conclusions  - Left ventricle: The cavity size was severely dilated. Wall   thickness was increased in a pattern of mild LVH. The   estimated ejection fraction was 15%. Diffuse hypokinesis. - Aortic valve: Mild to moderate regurgitation. - Mitral valve: Moderate regurgitation. - Left atrium: The atrium was mildly dilated. - Right ventricle: The cavity size was mildly dilated. Wall   thickness was normal. - Right atrium: The atrium was mildly dilated. - Atrial septum: No defect or patent foramen ovale was   identified. - Tricuspid valve: Moderate regurgitation. - Pericardium, extracardiac: There was a left pleural   effusion. _____________  Renal Ultrasound 04/16/2012  IMPRESSION: Bilateral severe hydronephrosis, significantly increased on the right and slightly increased on the left since prior study.  Echogenic kidneys bilaterally compatible with medical renal disease.  Prostate  enlargement _____________  History of Present Illness  78 year old male with the above complex problem list who was in his usual state of health until approximately 2 weeks prior to admission when he began to experience progressive lower extremity edema. Approximately 2 days prior to admission he noted increasing dyspnea, orthopnea, and PND. He presented to the [facility] on January 23 where his proBNP was elevated at 18,809, and he had evidence of volume overload on exam. He was seen by cardiology and admitted for further evaluation.  Hospital Course  Patient ruled out for myocardial infarction. He was placed on intravenous Lasix as well as nesiritide for afterload reduction. With this regimen, he had good diuresis and symptomatic improvement. He had a net negative of 10 L with reduction in weight of 9 pounds. His nesiritide was converted to oral hydralazine and nitrates by January 25, and Lasix was switched to an oral formulation on the same day.  He has been initiated on low-dose carvedilol therapy and is not a candidate for ACE inhibitor or ARB therapy secondary to chronic kidney disease. In the setting of diuresis and relative hypotension, his BUN and creatinine rose to a peak of 68 and 2.6 respectively. His Lasix dose has been scaled back to once daily only and hydralazine has been discontinued. A 2-D echocardiogram was repeated during admission and showed significantly reduced LV function with an EF of 15%, down from previous known EF of 40-45% in January 2012. Patient's aortic insufficiency, which was previously felt to be moderate to severe, was now noted to be mild to moderate.  He has been followed by nephrology throughout his admission and renal ultrasound was performed on January 27 showing severe bilateral hydronephrosis. He previously  had a history of left-sided hydronephrosis. This will be followed closely by nephrology  in the outpatient setting and urology referral may need to be  made.  With clinical improvement, patient is now felt ready for discharge. In the setting of his cardiomyopathy, we feel that he does require an ischemic evaluation however because of his chronic kidney disease, he is understandably reluctant to pursue diagnostic catheterization. As result, we have arranged for an outpatient pharmacologic stress test early next week. We will also followup a basic metabolic panel and see him in clinic. He'll be discharged home today in good condition.  Discharge Vitals Blood pressure 100/52, pulse 69, temperature 98.6 F (37 C), temperature source Oral, resp. rate 18, height 5\' 11"  (1.803 m), weight 147 lb 0.8 oz (66.7 kg), SpO2 95.00%.  Filed Weights   04/14/12 0354 04/15/12 0432 04/17/12 0624  Weight: 153 lb 10.6 oz (69.7 kg) 147 lb 4.3 oz (66.8 kg) 147 lb 0.8 oz (66.7 kg)   Labs  CBC  Basename 04/15/12 1011  WBC 3.6*  NEUTROABS 2.5  HGB 11.8*  HCT 36.0*  MCV 87.8  PLT 119*   Basic Metabolic Panel  Basename 04/17/12 0605 04/16/12 0905 04/16/12 0430 04/14/12 1045  NA 141 -- 137 --  K 4.8 4.3 -- --  CL 102 -- 100 --  CO2 26 -- 26 --  GLUCOSE 93 -- 82 --  BUN 68* -- 60* --  CREATININE 2.60* -- 2.42* --  CALCIUM 8.3* -- 8.6 --  MG -- -- -- 1.6  PHOS 4.8* -- -- --   Liver Function Tests  Basename 04/17/12 0605 04/16/12 0430  AST -- 34  ALT -- 28  ALKPHOS -- 94  BILITOT -- 0.5  PROT -- 6.4  ALBUMIN 3.4* 3.2*   Cardiac Enzymes Lab Results  Component Value Date   TROPONINI <0.30 04/13/2012   Fasting Lipid Panel Lab Results  Component Value Date   CHOL 138 04/13/2012   HDL 60 04/13/2012   LDLCALC 64 04/13/2012   TRIG 68 04/13/2012   CHOLHDL 2.3 04/13/2012   Thyroid Function Tests Lab Results  Component Value Date   TSH 2.605 04/12/2012   Disposition  Pt is being discharged home today in good condition.  Follow-up Plans & Appointments  Follow-up Information    Follow up with Garnetta Buddy, MD. On 05/14/2012. (@ 1130)     Contact information:   [address] [place] [address] [phone]       Follow up with Tereso Newcomer, PA. On 04/24/2012. (11:15 AM - Dr. Elvis Coil PA.  We will check a blood chemistry that day.)    Contact information:   [address] [place] [address] [phone]       Follow up with [facility] - Pharmacologic Stress Test. On 04/23/2012. (12:00 PM - Nothing to eat or drink after 6 AM.  No caffeine on 2/2 or 2/3 please.)    Contact information:   [address] [place] [phone]       Discharge Medications    Medication List     As of 04/17/2012  9:38 AM    TAKE these medications         carvedilol 3.125 MG tablet   Commonly known as: COREG   Take 1 tablet (3.125 mg total) by mouth 2 (two) times daily with a meal.      furosemide 40 MG tablet   Commonly known as: LASIX   Take 1  tablet (40 mg total) by mouth daily.      isosorbide mononitrate 30 MG 24 hr tablet   Commonly known as: IMDUR   Take 0.5 tablets (15 mg total) by mouth daily.      nitroGLYCERIN 0.4 MG SL tablet   Commonly known as: NITROSTAT   Place 1 tablet (0.4 mg total) under the tongue every 5 (five) minutes x 3 doses as needed for chest pain.      triamcinolone cream 0.1 %   Commonly known as: KENALOG   Apply 1 application topically daily as needed. For rash      Outstanding Labs/Studies  BMET and lexiscan cardiolite are scheduled for next week.  Duration of Discharge Encounter   Greater than 30 minutes including physician time.  Signed, Nicolasa Ducking NP 04/17/2012, 9:38 AM

## 2012-04-17 NOTE — Telephone Encounter (Signed)
New Problem    Transitional care appt on 2/4 at 11:30 with [name] per Nicolasa Ducking

## 2012-04-17 NOTE — Progress Notes (Signed)
TELEMETRY: Reviewed telemetry pt in NSR with frequent PVCs, NSVT: Filed Vitals:   04/16/12 1436 04/16/12 1719 04/16/12 2015 04/17/12 0624  BP: 104/88 93/36 97/43  100/57  Pulse:  83 67 69  Temp:   98.1 F (36.7 C) 98.6 F (37 C)  TempSrc:   Oral Oral  Resp:   18 18  Height:      Weight:    147 lb 0.8 oz (66.7 kg)  SpO2:   100% 95%    Intake/Output Summary (Last 24 hours) at 04/17/12 0647 Last data filed at 04/17/12 3474  Gross per 24 hour  Intake    643 ml  Output   2200 ml  Net  -1557 ml    SUBJECTIVE Patient reports he is feeling much better. Denies dyspnea, chest pain, dizzyness or lightheadedness. Swelling resolved.   LABS: Basic Metabolic Panel:  Basename 04/16/12 0905 04/16/12 0430 04/15/12 2258 04/14/12 1045  NA -- 137 136 --  K 4.3 5.6* -- --  CL -- 100 97 --  CO2 -- 26 27 --  GLUCOSE -- 82 107* --  BUN -- 60* 61* --  CREATININE -- 2.42* 2.52* --  CALCIUM -- 8.6 8.3* --  MG -- -- -- 1.6  PHOS -- -- -- --   Liver Function Tests:  Basename 04/16/12 0430  AST 34  ALT 28  ALKPHOS 94  BILITOT 0.5  PROT 6.4  ALBUMIN 3.2*   CBC:  Basename 04/15/12 1011  WBC 3.6*  NEUTROABS 2.5  HGB 11.8*  HCT 36.0*  MCV 87.8  PLT 119*   Cardiac Enzymes: No results found for this basename: CKTOTAL:3,CKMB:3,CKMBINDEX:3,TROPONINI:3 in the last 72 hours BNP: 18809 TSH: 2.605  Radiology/Studies:  Dg Chest 2 View  04/15/2012  *RADIOLOGY REPORT*  Clinical Data: Follow-up congestive heart failure  CHEST - 2 VIEW  Comparison: 04/12/2012  Findings: Cardiac silhouette upper normal in size.  Vascular pattern is normal.  Tiny right effusion.  Small to moderate left effusion.  There is also bilateral lower lobe atelectasis.  IMPRESSION: No significant change from prior study with bibasilar atelectasis and effusions.   Original Report Authenticated By: Esperanza Heir, M.D.    Dg Chest 2 View  04/12/2012  *RADIOLOGY REPORT*  Clinical Data: Shortness of breath.  Lower  extremity swelling.  CHEST - 2 VIEW  Comparison: 12/26/2009  Findings: Small bilateral pleural effusions, left larger than right.  Bibasilar atelectasis, also worse on the left.  Mild cardiomegaly.  Hyperinflation of the lungs.  No acute bony abnormality.  IMPRESSION: Bilateral effusions and bibasilar atelectasis, left greater than right.  Cardiomegaly, COPD.   Original Report Authenticated By: Charlett Nose, M.D.    ECG:13-Apr-2012 07:47:09 [facility]-26 ROUTINE RECORD Sinus rhythm with frequent Premature ventricular complexes T wave abnormality, consider lateral ischemia Non-specific intra-ventricular conduction delay, prolonged QT  Transthoracic Echocardiography  Patient: [name], [name] MR #: [id number] Study Date: 04/13/2012 Gender: M Age: 78 Height: 180.3cm Weight: 69.9kg BSA: 1.73m^2 Pt. Status: Room: CD11C  PERFORMING [place], [facility] Theodore Demark SONOGRAPHER Mathews Argyle, RDCS, ARDMS cc:  ------------------------------------------------------------ LV EF: 15%  ------------------------------------------------------------ Indications: CHF - 428.0.  ------------------------------------------------------------ History: PMH: Dyspnea. Atrial flutter.  ------------------------------------------------------------ Study Conclusions  - Left ventricle: The cavity size was severely dilated. Wall thickness was increased in a pattern of mild LVH. The estimated ejection fraction was 15%. Diffuse hypokinesis. - Aortic valve: Mild to moderate regurgitation. - Mitral valve: Moderate regurgitation. - Left atrium: The atrium was mildly dilated. - Right ventricle: The cavity  size was mildly dilated. Wall thickness was normal. - Right atrium: The atrium was mildly dilated. - Atrial septum: No defect or patent foramen ovale was identified. - Tricuspid valve: Moderate regurgitation. - Pericardium, extracardiac: There was a left  pleural effusion. Transthoracic echocardiography. M-mode, complete 2D, spectral Doppler, and color Doppler. Height: Height: 180.3cm. Height: 71in. Weight: Weight: 69.9kg. Weight: 153.8lb. Body mass index: BMI: 21.5kg/m^2. Body surface area: BSA: 1.30m^2. Blood pressure: 101/63. Patient status: Inpatient. Location: Bedside.  ------------------------------------------------------------  ------------------------------------------------------------ Left ventricle: The cavity size was severely dilated. Wall thickness was increased in a pattern of mild LVH. The estimated ejection fraction was 15%. Diffuse hypokinesis.  ------------------------------------------------------------ Aortic valve: Trileaflet; mildly thickened leaflets. Doppler: Mild to moderate regurgitation.  ------------------------------------------------------------ Aorta: There was severe dilation of the sinus(es) of Valsalva.  ------------------------------------------------------------ Mitral valve: Structurally normal valve. Leaflet separation was normal. Doppler: Transvalvular velocity was within the normal range. There was no evidence for stenosis. Moderate regurgitation.  ------------------------------------------------------------ Left atrium: The atrium was mildly dilated.  ------------------------------------------------------------ Atrial septum: No defect or patent foramen ovale was identified.  ------------------------------------------------------------ Right ventricle: The cavity size was mildly dilated. Wall thickness was normal. Systolic function was normal.  ------------------------------------------------------------ Pulmonic valve: Doppler: Mild regurgitation.  ------------------------------------------------------------ Tricuspid valve: Doppler: Moderate regurgitation.  ------------------------------------------------------------ Right atrium: The atrium was mildly  dilated.  ------------------------------------------------------------ Pericardium: There was no pericardial effusion.  ------------------------------------------------------------ Systemic veins: Inferior vena cava: The vessel was dilated; the respirophasic diameter changes were blunted (< 50%); findings are consistent with elevated central venous pressure.  ------------------------------------------------------------ Pleura: There was a left pleural effusion.  ------------------------------------------------------------  2D measurements Normal Doppler measurements Normal Left ventricle Left ventricle LVID ED, 67 mm 43-52 Ea, med ann, 5.95 cm/s ------ chord, tiss DP PLAX Mitral valve LVID ES, 62.5 mm 23-38 Regurg alias 29.6 cm/s ------ chord, vel, PISA PLAX Max regurg 407 cm/s ------ FS, chord, 7 % >29 vel PLAX Regurg VTI 135 cm ------ LVPW, ED 12.3 mm ------ ERO, PISA 0.16 cm^2 ------ IVS/LVPW 0.96 <1.3 Regurg vol, 22 ml ------ ratio, ED PISA Vol ED, 191 ml ------ Tricuspid valve MOD1 Regurg peak 319 cm/s ------ Vol ES, 133 ml ------ vel MOD1 Peak RV-RA 41 mm ------ EF, MOD1 30 % ------ gradient, S Hg Vol index, 102 ml/m^2 ------ Right ventricle ED, MOD1 Sa vel, lat 20.1 cm/s ------ Vol index, 71 ml/m^2 ------ ann, tiss DP ES, MOD1 Pulmonic valve Ventricular septum Regurg vel, 107 cm/s ------ IVS, ED 11.8 mm ------ ED Aorta Root diam, 49 mm ------ ED Left atrium AP dim 27 mm ------ AP dim 1.45 cm/m^2 <2.2 index  ------------------------------------------------------------ Prepared and Electronically Authenticated by  Cassell Clement 2014-01-24T15:06:[id number]   PHYSICAL EXAM General: Well developed, thin, in no acute distress. Head: Normocephalic, atraumatic, sclera non-icteric, no xanthomas, nares are without discharge. Neck: Negative for carotid bruits. JVD not elevated. Lungs: Clear bilaterally to auscultation without wheezes, rales, or rhonchi.   Decreased BS left base. Breathing is unlabored. Heart: RRR S1 S2 with 1/6 diastolic murmur.  Abdomen: Soft, non-tender, non-distended with normoactive bowel sounds. No hepatomegaly. No rebound/guarding. No obvious abdominal masses. Msk:  Strength and tone appears normal for age. Extremities: trace right ankle edema.  Distal pedal pulses are 2+ and equal bilaterally. Neuro: Alert and oriented X 3. Moves all extremities spontaneously. Psych:  Responds to questions appropriately with a normal affect.  ASSESSMENT AND PLAN: 1. Acute on chronic systolic CHF. EF has dropped from 40-45% to 15% in 2 years. Previously refused medical Rx or cardiac cath.  Clinically much better with diuresis. Weight down 9 pounds. Will slowly titrate Coreg, nitrates, and hydralazine as BP allows. Not a candidate for ACEi or ARB due to renal function.Instructed on importance of sodium restriction and daily weights. Will reduce lasix to 40 mg daily to avoid hypotension. Patient states he doesn't want to take hydralazine because he is afraid his BP is too low. He will monitor his BP at home and we will follow up in one week. Stressed importance of taking his meds for his CHF. Will DC home today.  2. Acute on chronic renal failure. Exacerbated by low output with CHF. Patient very concerned about renal function. States Kidney problems started when he received contrast during management of non-Hodgkin's lymphoma. Refuses cardiac cath now. Will arrange outpatient lexiscan myoview to evaluate whether or not he has ischemia as a cause of his cardiomyopathy. If not we can continue medical therapy. If myoview is abnormal would need to revisit cardiac cath issue. Renal ultrasound shows bilateral hydronephrosis but apparently this is an old finding from 4/12. Can readdress with Renal on follow up visit.  3. Aortic insufficiency. Described as moderate to severe in past. Now mild to moderate. Also has moderate MR. I do not think valve replacement  or repair indicated.  4. History of non-Hodgkin's lymphoma.  5. PVCs chronic.  Principal Problem:  *Acute combined systolic and diastolic CHF, NYHA class 3 Active Problems:  Aortic insufficiency  PVCs (premature ventricular contractions)  LV dysfunction  CKD (chronic kidney disease), stage III    Signed, Peter Swaziland MD,FACC 04/17/2012 6:51 AM

## 2012-04-17 NOTE — Progress Notes (Signed)
Pt discharged per MD order and protocol . All discharge instructions reviewed with patient and all questions answered. Pt aware of all follow up appointments.

## 2012-04-17 NOTE — Discharge Summary (Signed)
Patient seen and examined and history reviewed. Agree with above findings and plan. See earlier rounding note.  Theron Arista Barnet [facility] 04/17/2012 11:01 AM

## 2012-04-17 NOTE — Progress Notes (Signed)
[facility] Progress Note  Patient name: [name] Medical record number: [id number] Date of birth: 1934/10/22 Age: 78 y.o. Gender: male  Primary Care Provider: [name], MD  Primary OP Nephrologist: Sees Dr. Hyman Hopes, saw him last April 2013 due to return this year  Primary Hospital Service: [facility] (Dr. Swaziland - Primary)  Service Requesting Consult: Same   Subjective:   Feels good, no complaints.  Denies frequency, urgency, hesitancy. Reports Never seen Urology. We were able to find out that he has seen urology for this bilateral hydro back in 2012 which was determined to not be obstructive   Objective:   Vitals: Temp:  [97.2 F (36.2 C)-98.6 F (37 C)] 98.6 F (37 C) (01/28 0624) Pulse Rate:  [57-83] 69  (01/28 0624) Resp:  [18-19] 18  (01/28 0624) BP: (93-105)/(36-88) 100/52 mmHg (01/28 0744) SpO2:  [95 %-100 %] 95 % (01/28 0624) Weight:  [147 lb 0.8 oz (66.7 kg)] 147 lb 0.8 oz (66.7 kg) (01/28 0624)  Weight: Wt Readings from Last 3 Encounters:  04/17/12 147 lb 0.8 oz (66.7 kg)  02/22/12 151 lb 12.8 oz (68.856 kg)   Weight change:   I&Os: Yesterday: 01/27 0701 - 01/28 0700 In: 643 [P.O.:640; I.V.:3] Out: 2200 [Urine:2200] This shift:     PE:  GENERAL: Adult, thin, elderly male. Examined in [facility]. In no discomfort; norespiratory distress.  PSYCH: Alert and appropriately interactive; Insight:Good Alert, oriented, thought content appropriate  H&N: EOMi, MMM, no scleral icterus, no JVD, trachea midline  THORAX:   HEART: irregularlly irregular, s1/s2, systolic murmur   LUNGS: fine, faint crackles in B bases  ABDOMEN: +BS, soft, nontender  EXTREMITIES: Moves all 4 extremities spontaneously, warm well perfused, no LE edema   MICRO: Results for orders placed during the hospital encounter of 04/12/12  MRSA PCR SCREENING     Status: Normal   Collection Time   04/12/12 11:19 PM      Component Value Range Status Comment   MRSA  by PCR NEGATIVE  NEGATIVE Final     IMAGING: Dg Chest 2 View  04/15/2012  *RADIOLOGY REPORT*  Clinical Data: Follow-up congestive heart failure  CHEST - 2 VIEW  Comparison: 04/12/2012  Findings: Cardiac silhouette upper normal in size.  Vascular pattern is normal.  Tiny right effusion.  Small to moderate left effusion.  There is also bilateral lower lobe atelectasis.  IMPRESSION: No significant change from prior study with bibasilar atelectasis and effusions.   Original Report Authenticated By: Esperanza Heir, M.D.    US Renal  04/16/2012  *RADIOLOGY REPORT*  Clinical Data: 78 year old male with acute on chronic renal failure.  RENAL/URINARY TRACT ULTRASOUND COMPLETE  Comparison:  12/06/2007 renal ultrasound and 07/14/2010 renogram.  Findings:  Right Kidney:  The right kidney is increased in echogenicity measuring 11 cm.  Severe right hydronephrosis is noted, increased since prior studies.  There is no evidence of solid mass.  Left Kidney:  The left kidney is increased in echogenicity measuring 12.5 cm.  Severe left hydronephrosis has increased since 2009 and may minimally increased since 2012.  There is no evidence of solid mass.  Bladder:  The bladder is unremarkable with ureteral jets.  Prevoid volume measures 241 ml and postvoid volume measures 69 ml. An enlarged prostate is present.  IMPRESSION: Bilateral severe hydronephrosis, significantly increased on the right and slightly increased on the left since prior study.  Echogenic kidneys bilaterally compatible with medical renal disease.  Prostate enlargement.   Original Report  Authenticated By: Harmon Pier, M.D.    2D ECHO 04/13/12  - LV: Dilated, LVH, EF 15% with diffuse hypokinesis  - Aortic Valve: Mild to Mod Regurg  - Mitral Valve: Mod regurg  - Tricuspid: Mod regurg  - RV: dilated but normal function  12/06/2007 - Renal US :  2 kidneys, hypoechogenicity  07/14/10 - Renal Scintiangiography with Flow and Function  1. Bilateral hydronephrosis  with flat renogram curves. The left kidney demonstrates no appreciable response to Lasix and the right kidney demonstrates a good response to Lasix.  2. Relative functioning renal mass is 53% on the left and 47% on the right.    LABS  Lab 04/17/12 0605 04/16/12 0905 04/16/12 0430 04/15/12 2258 04/15/12 0042 04/14/12 1045 04/14/12 0525 04/13/12 0222 04/12/12 2129 04/12/12 1224  NA 141 -- 137 136 137 -- 143 145 -- 144  K 4.8 4.3 5.6* 4.2 3.8 -- 4.1 4.1 -- 4.4  CL 102 -- 100 97 100 -- 107 114* -- 113*  CO2 26 -- 26 27 26  -- 25 22 -- 19  BUN 68* -- 60* 61* 57* -- 49* 43* -- 39*  CREATININE 2.60* -- 2.42* 2.52* 2.49* -- 2.41* 2.22* -- 2.07*  CALCIUM 8.3* -- 8.6 8.3* 8.5 -- 8.6 8.5 -- 8.4  GLUCOSE 93 -- 82 107* 103* -- 88 89 -- 135*  MG -- -- -- -- -- 1.6 -- -- 1.9 --  PHOS 4.8* -- -- -- -- -- -- -- -- --  PROT -- -- 6.4 -- -- -- -- -- -- 6.1  ALBUMIN 3.4* -- 3.2* -- -- -- -- -- -- 3.2*    Lab 04/15/12 1011 04/12/12 1224  WBC 3.6* 3.2*  HGB 11.8* 11.0*  HCT 36.0* 32.8*  PLT 119* 111*  RDW 14.4 14.9  MCV 87.8 87.5  MCHC 32.8 33.5    Lab 04/16/12 0430 04/12/12 1224  ALT 28 40  AST 34 31  ALKPHOS 94 109  BILITOT 0.5 1.0  BILIDIR -- --   Results for RAMONDO, DIETZE (MRN [id number]) as of 04/17/2012 08:20  04/16/2012 16:03  Color, Urine YELLOW  APPearance CLEAR  Specific Gravity, Urine 1.008  pH 6.0  Glucose NEGATIVE  Bilirubin Urine NEGATIVE  Ketones, ur NEGATIVE  Protein NEGATIVE  Urobilinogen, UA 0.2  Nitrite NEGATIVE  Leukocytes, UA NEGATIVE  Hgb urine dipstick NEGATIVE    Results for JORAH, HUA (MRN [id number]) as of 04/17/2012 08:20  04/16/2012 16:03  Total Protein, Urine 4.6  PROTEIN CREATININE RATIO 0.08  Creatinine, Urine 57.08     Medications:       . carvedilol  3.125 mg Oral BID WC  . furosemide  40 mg Oral Daily  . hydrALAZINE  10 mg Oral Q8H  . isosorbide mononitrate  15 mg Oral Daily  . sodium chloride  3 mL Intravenous Q12H      Assessment/ Plan:   LOS: 2 78 y.o. year old male with acute on chronic combined CHF with acute on Chronic Renal insuffiencey who presented with acute dyspnea and PND. He has undergone an ~ 10# diuresis and is significantly improved from a respiratory standpoint. His history is significant for Non-Hodgkin's Lymphoma s/p chemotherapy 12 years ago.   # Acute Kidney Injury on Chronic CKD (Stage III): followed as OP by Dr. Hyman Hopes. Cr Baseline of 1.7-1.8 in 2007 and 2011; presented with Cr of 2.07. Cr has climbed with Diuresis but stable. Likely reflects hemodilution revealing his true/new/current baseline Cr of ~2.4.  - Renal US  reveals hydronephrosis - reports never seen by Urology but has had renal nuclear studies by urology - will likely need f/u, denies any BPH like symptoms, may benefit from Flomax as OP but compliance a concern.  I spoke to Dr. Berneice Heinrich with [facility].  He agreed that hydro was most likely chronic and not obstructive but did say they would be happy to see him if creatinine doesn't settle as OP. - UA non-revealing, Pr:Cr ratio 0.08 - f/u with arranged for 2/24 with Dr. Hyman Hopes at 11:30 - will also order labs in one week to be sent to [facility] - Control Risk factors for chronic renal disease  - continue lasix as OP but concerns for compliance.  # Acute on Chronic Combined Systolic and Diastolic CHF, NYHA Class 3 with EF of 15%: followed by [facility]. Pending Myoview as OP, refusing Cath due to contrast.   # Aortic Valve Insufficiency: Followed by [facility], valve replacement not indicated  - Per [facility], recommending further OP workup including Nuclear Study as pt not agreeable to cardiac Cath   # HTN: not on meds at time of admission:  Agree with Coreg and Lasix, may not tolerate/be compliant with much beyond this. ACEi may be indicated if evidence of proteinuria/microalbuminuria however hold at this time until established with good OP follow up as he will need diligent titration and hold  parameters to prevent further worsening of his kidney disease- not now   # Hx of non-Hodgkin's lymphoma:   # Anemia:   # Metabolic: PTH pending  Disposition: Okay to D/c to home, follow up with Dr. Hyman Hopes on 2/24 @ 1130 (in f/u section of d/c plans)  F/u Hydronephrosis, may need follow up with Urology  --- FEN  *SL IVFs  -Cardiac Diet    Andrena Mews, DO [facility] Resident - PGY-1 04/17/2012 8:21 AM  Patient seen and examined, agree with above note with above modifications. No new issues.  I feel like creatinine is higher more so due to diuresis than anything, patient not uremic.  Agree with sending out on lower dose lasix, I will order OP labs and follow up appt with Dr. Hyman Hopes.  Urology issue as above.   Annie Sable, MD 04/17/2012

## 2012-04-18 ENCOUNTER — Telehealth: Payer: Self-pay | Admitting: *Deleted

## 2012-04-18 NOTE — Telephone Encounter (Signed)
Transitional Care Management phone call. Patient states he is "doing well". Denies problems or concerns. Reviewed followup appts for Stress Test on 2/3 (with directions to abstain from caffeine 2/2, 2/3 and no food/water after 6am on day of test), followup appt at [facility] on 2/4, and appt. With Dr. Elvis Coil on 2/24. Patient confirms appts and directions. Advised to contact us for any concerns, problems or concerns.

## 2012-04-19 ENCOUNTER — Telehealth: Payer: Self-pay

## 2012-04-19 NOTE — Telephone Encounter (Signed)
Spoke to patient was told Dr.Jordan advised does not need echo 04/23/12.Advised to keep appointment 04/23/12 for [facility] and appointment with Tereso Newcomer PA 04/24/12.

## 2012-04-19 NOTE — Telephone Encounter (Signed)
Received a message from echo tech.Patient is scheduled for a echo 04/23/12.Patient had echo in hospital.Discharge summary did not indicate to repeat echo.Message sent to Dr.Jordan for advice.

## 2012-04-23 ENCOUNTER — Ambulatory Visit (HOSPITAL_COMMUNITY): Payer: Medicare Other | Attending: Cardiovascular Disease | Admitting: Radiology

## 2012-04-23 ENCOUNTER — Other Ambulatory Visit (HOSPITAL_COMMUNITY): Payer: Medicare Other

## 2012-04-23 VITALS — BP 110/85 | Ht 71.0 in | Wt 141.0 lb

## 2012-04-23 DIAGNOSIS — R9431 Abnormal electrocardiogram [ECG] [EKG]: Secondary | ICD-10-CM

## 2012-04-23 DIAGNOSIS — I4891 Unspecified atrial fibrillation: Secondary | ICD-10-CM | POA: Insufficient documentation

## 2012-04-23 DIAGNOSIS — R5381 Other malaise: Secondary | ICD-10-CM | POA: Insufficient documentation

## 2012-04-23 DIAGNOSIS — I5022 Chronic systolic (congestive) heart failure: Secondary | ICD-10-CM

## 2012-04-23 DIAGNOSIS — R5383 Other fatigue: Secondary | ICD-10-CM | POA: Insufficient documentation

## 2012-04-23 DIAGNOSIS — I509 Heart failure, unspecified: Secondary | ICD-10-CM | POA: Insufficient documentation

## 2012-04-23 MED ORDER — REGADENOSON 0.4 MG/5ML IV SOLN
0.4000 mg | Freq: Once | INTRAVENOUS | Status: AC
  Administered 2012-04-23: 0.4 mg via INTRAVENOUS

## 2012-04-23 MED ORDER — TECHNETIUM TC 99M SESTAMIBI GENERIC - CARDIOLITE
11.0000 | Freq: Once | INTRAVENOUS | Status: AC | PRN
  Administered 2012-04-23: 11 via INTRAVENOUS

## 2012-04-23 MED ORDER — TECHNETIUM TC 99M SESTAMIBI GENERIC - CARDIOLITE
33.0000 | Freq: Once | INTRAVENOUS | Status: AC | PRN
  Administered 2012-04-23: 33 via INTRAVENOUS

## 2012-04-23 NOTE — Progress Notes (Signed)
[facility] [address] NUCLEAR MED [address] [place], [place] [address] [phone]    Cardiology Nuclear Med Study  [name] is a 78 y.o. male     MRN : [id number]     DOB: November 01, 1934  Procedure Date: 04/23/2012  Nuclear Med Background Indication for Stress Test:  Evaluation for Ischemia and Post Hospital:04/12/12 Hospital with CHF and decreased EF=15 History:  History of  Atrial Flutter and CHF,Chronic PVC's,04/13/12 Echo: EF= 15%,mild LVH,Moderate AR and moderate MR and TR, Cardiac Risk Factors: none risk factors  Symptoms:  Fatigue   Nuclear Pre-Procedure Caffeine/Decaff Intake:  None NPO After: 7:00pm   Lungs:  clear O2 Sat: 98% on room air. IV 0.9% NS with Angio Cath:  22g  IV Site: R Hand  IV Started by:  Cathlyn Parsons, RN  Chest Size (in):  40 Cup Size: n/a  Height: 5\' 11"  (1.803 m)  Weight:  141 lb (63.957 kg)  BMI:  Body mass index is 19.67 kg/(m^2). Tech Comments:  Coreg held 18 hrs    Nuclear Med Study 1 or 2 day study: 1 day  Stress Test Type:  Lexiscan  Reading MD: Kristeen Miss, MD  Order Authorizing Provider:  Vonna Drafts  Resting Radionuclide: Technetium 65m Sestamibi  Resting Radionuclide Dose: 11.0 mCi   Stress Radionuclide:  Technetium 58m Sestamibi  Stress Radionuclide Dose: 33.0 mCi           Stress Protocol Rest HR: 92 Stress HR: 99  Rest BP: 110/85 Stress BP: 142/96  Exercise Time (min): n/a METS: n/a   Predicted Max HR: 144 bpm % Max HR: 72.22 bpm Rate Pressure Product: 21308    Dose of Adenosine (mg):  n/a Dose of Lexiscan: 0.4 mg  Dose of Atropine (mg): n/a Dose of Dobutamine: n/a mcg/kg/min (at max HR)  Stress Test Technologist: Cathlyn Parsons, RN  Nuclear Technologist:  Domenic Polite, CNMT     Rest Procedure:  Myocardial perfusion imaging was performed at rest 45 minutes following the intravenous administration of Technetium 22m Sestamibi. Rest ECG: atrial fibrillation with frequent aberrantly conducted  beats vs. PVCs.  Stress Procedure:  The patient received IV Lexiscan 0.4 mg over 15-seconds.  Technetium 38m Sestamibi injected at 30-seconds.  Quantitative spect images were obtained after a 45 minute delay. Stress ECG: No significant change from baseline ECG  QPS Raw Data Images:  Normal; no motion artifact; normal heart/lung ratio. Stress Images:  The LV is markedly enlarged.  There is a large, severe defect involving the apex and mid / distal inferior wall.  The uptake in the remaing segments is normal.      Rest Images:  The LV is markedly enlarged.  There is a large, severe defect involving the apex and mid / distal inferior wall.  The uptake in the remaing segments is normal. Subtraction (SDS):  There is evidence of a large scar involving the apex and mid / distal inferior walls.  Transient Ischemic Dilatation (Normal <1.22):  0.95 Lung/Heart Ratio (Normal <0.45):  0.34  Quantitative Gated Spect Images QGS EDV:  NA QGS ESV:  NA  Impression Exercise Capacity:  Lexiscan with no exercise. BP Response:  Normal blood pressure response. Clinical Symptoms:  No significant symptoms noted. ECG Impression:  No significant ST segment change suggestive of ischemia. Comparison with Prior Nuclear Study: No images to compare  Overall Impression:  High risk stress nuclear study.  There is evidence of a previous inferior apical MI.  The LV is markedly  dilated.    LV Ejection Fraction: Study not gated because of significant arrhythmia.     [name], [name]., MD, [facility] 04/23/2012, 5:28 PM Office - [phone] Pager [phone]

## 2012-04-24 ENCOUNTER — Other Ambulatory Visit (INDEPENDENT_AMBULATORY_CARE_PROVIDER_SITE_OTHER): Payer: Medicare Other

## 2012-04-24 ENCOUNTER — Ambulatory Visit (INDEPENDENT_AMBULATORY_CARE_PROVIDER_SITE_OTHER): Payer: Medicare Other | Admitting: Physician Assistant

## 2012-04-24 ENCOUNTER — Encounter: Payer: Self-pay | Admitting: Physician Assistant

## 2012-04-24 VITALS — BP 112/68 | HR 76 | Ht 71.0 in | Wt 149.4 lb

## 2012-04-24 DIAGNOSIS — I255 Ischemic cardiomyopathy: Secondary | ICD-10-CM | POA: Insufficient documentation

## 2012-04-24 DIAGNOSIS — I2510 Atherosclerotic heart disease of native coronary artery without angina pectoris: Secondary | ICD-10-CM

## 2012-04-24 DIAGNOSIS — N189 Chronic kidney disease, unspecified: Secondary | ICD-10-CM

## 2012-04-24 DIAGNOSIS — I5022 Chronic systolic (congestive) heart failure: Secondary | ICD-10-CM

## 2012-04-24 DIAGNOSIS — R0989 Other specified symptoms and signs involving the circulatory and respiratory systems: Secondary | ICD-10-CM

## 2012-04-24 HISTORY — DX: Ischemic cardiomyopathy: I25.5

## 2012-04-24 HISTORY — DX: Atherosclerotic heart disease of native coronary artery without angina pectoris: I25.10

## 2012-04-24 HISTORY — DX: Chronic systolic (congestive) heart failure: I50.22

## 2012-04-24 NOTE — Patient Instructions (Addendum)
Your physician recommends that you schedule a follow-up appointment in: 1-2 WEEKS NO LATER WITH DR. Swaziland PER SCOTT WEAVER, PAC  NO CHANGES WERE MADE TODAY

## 2012-04-24 NOTE — Progress Notes (Signed)
[address] [place], [place]  [address] Phone: [phone], Fax:  [phone]  Date:  04/24/2012   ID:  [name], DOB [birth year], MRN [id number]  PCP:  [name], MD  Primary Cardiologist:  Dr. Peter [place]and     History of Present Illness: [name] is a 78 y.o. male who returns for followup after recent admission to the hospital for acute systolic CHF.  He has a history of aortic insufficiency, atrial flutter, non-Hodgkin's lymphoma, LV dysfunction, CKD. He has refused cardiac catheterization the past. He was admitted 1/23-1/28. He presented with progressive lower extremity edema and increasing dyspnea. He ruled out for MI. He was diuresed with IV Lasix. He was also placed on nesiritide. He was placed on low-dose carvedilol, hydralazine and nitrates. ACE inhibitor/ARB has been avoided due to his CKD. Patient could not tolerate hydralazine due to low BP.  Echocardiogram 04/13/12: Mild LVH, EF 15%, diffuse HK, mild to moderate AI, moderate MR, mild LAE, mild RVE, mild RAE, moderate TR, left pleural effusion. This was significantly changed from 40-45% in 1/12. He was followed by nephrology. Renal U/S demonstrated severe bilateral hydronephrosis. Patient is to be seen as an outpatient by both nephrology and urology. Outpatient stress test was arranged. Lexiscan Myoview 04/24/12: Inferior apical MI, study not gated due to frequent PVCs, triplets.  Patient states he feels good.  Denies dyspnea, chest pain, syncope, near syncope.  No orthopnea, PND, edema.  Weights stable.  No palps.  Wt Readings from Last 3 Encounters:  04/24/12 149 lb 6.4 oz (67.767 kg)  04/23/12 141 lb (63.957 kg)  04/17/12 147 lb 0.8 oz (66.7 kg)     Past Medical History  Diagnosis Date  . Aortic insufficiency     a. Previously mod-severe;  b. 03/2012 Echo: Mild to Mod AI  . Atrial flutter     resolved  . Anemia   . PVCs (premature ventricular contractions)   . Chronic  systolic CHF (congestive heart failure)     a.  03/2012 Echo: EF 15%, Mild to Mod AI, Mod MR  . Bilateral hydronephrosis     a. 03/2012 Renal u/s: Bilat Severe hydronephrosis, medical renal dzs.  . CKD (chronic kidney disease), stage III   . Non Hodgkin's lymphoma     resolved >10 yr ago  . Moderate mitral regurgitation     a.  03/2012 Echo: Mod MR  . BPH (benign prostatic hyperplasia)     a. noted on renal u/s 03/2012.  . Ischemic cardiomyopathy     a.  Lexiscan Myoview 04/24/12: Inferior apical MI, study not gated due to frequent PVCs, triplets => patient refuses cath    Current Outpatient Prescriptions  Medication Sig Dispense Refill  . carvedilol (COREG) 3.125 MG tablet Take 1 tablet (3.125 mg total) by mouth 2 (two) times daily with a meal.  60 tablet  6  . furosemide (LASIX) 40 MG tablet Take 1 tablet (40 mg total) by mouth daily.  30 tablet  6  . isosorbide mononitrate (IMDUR) 30 MG 24 hr tablet Take 0.5 tablets (15 mg total) by mouth daily.  15 tablet  6  . nitroGLYCERIN (NITROSTAT) 0.4 MG SL tablet Place 1 tablet (0.4 mg total) under the tongue every 5 (five) minutes x 3 doses as needed for chest pain.  25 tablet  3  . triamcinolone cream (KENALOG) 0.1 % Apply 1 application topically daily as needed. For rash        Allergies:  Allergies  Allergen Reactions  . Contrast Media (Iodinated Diagnostic Agents) Other (See Comments)    Patient does not want dye because it is hard on his kidneys  . Prednisone Rash    Social History:  The patient  reports that he has never smoked. He has never used smokeless tobacco. He reports that he does not drink alcohol or use illicit drugs.   ROS:  Please see the history of present illness.   All other systems reviewed and negative.   PHYSICAL EXAM: VS:  BP 112/68  Pulse 76  Ht 5\' 11"  (1.803 m)  Wt 149 lb 6.4 oz (67.767 kg)  BMI 20.84 kg/m2  SpO2 99% Well nourished, well developed, in no acute distress HEENT: normal Neck: no  JVD Cardiac:  normal S1, S2; irregular rhythm; 2/6 diastolic murmur; PMI displaced Lungs:  clear to auscultation bilaterally, no wheezing, rhonchi or rales Abd: soft, nontender, no hepatomegaly Ext: no edema Skin: warm and dry Neuro:  CNs 2-12 intact, no focal abnormalities noted      ASSESSMENT AND PLAN:  1. Ischemic Cardiomyopathy:  I had a long d/w the patient today regarding the rationale for further testing to include cardiac catheterization.  We discussed the significantly increased risk of sudden cardiac death and recurrent CHF.  He cannot tolerate further medication titration due to low BP.  He is not interested in trying hydralazine again.  He is not on ACE due to CKD.  He is not interested in pursuing cardiac cath.  I have asked him to review this further with his nephrologist.  He will be brought back in close follow up with Dr. Peter [place]. 2. Chronic Systolic CHF:  Volume stable.  Continue current Rx. 3. Chronic Kidney Disease:  Labs obtained at Dr. Marland Mcalpine office yesterday.  Will get a copy of his BMET. 4. Coronary Artery Disease:  Presumed based on myoview results.  I have asked him to start ASA 81 mg QD.  We discussed the rationale to take statins.  He is not interested. 5. Valvular Heart Disease:  Stable. 6. Disposition:  Follow up with Dr. Peter [place] in 2 weeks.    SignedTereso Newcomer, PA-C  11:47 AM 04/24/2012

## 2012-05-10 ENCOUNTER — Ambulatory Visit: Payer: Medicare Other | Admitting: Cardiology

## 2012-05-17 DIAGNOSIS — I5022 Chronic systolic (congestive) heart failure: Secondary | ICD-10-CM

## 2012-05-17 DIAGNOSIS — I2589 Other forms of chronic ischemic heart disease: Secondary | ICD-10-CM

## 2012-05-17 DIAGNOSIS — I2510 Atherosclerotic heart disease of native coronary artery without angina pectoris: Secondary | ICD-10-CM

## 2012-05-17 DIAGNOSIS — N189 Chronic kidney disease, unspecified: Secondary | ICD-10-CM

## 2012-06-12 ENCOUNTER — Encounter: Payer: Self-pay | Admitting: Cardiology

## 2012-06-22 ENCOUNTER — Encounter: Payer: Self-pay | Admitting: Cardiology

## 2012-06-22 ENCOUNTER — Ambulatory Visit (INDEPENDENT_AMBULATORY_CARE_PROVIDER_SITE_OTHER): Payer: Medicare Other | Admitting: Cardiology

## 2012-06-22 VITALS — BP 120/60 | HR 61 | Ht 71.0 in | Wt 136.0 lb

## 2012-06-22 DIAGNOSIS — I340 Nonrheumatic mitral (valve) insufficiency: Secondary | ICD-10-CM

## 2012-06-22 DIAGNOSIS — I059 Rheumatic mitral valve disease, unspecified: Secondary | ICD-10-CM

## 2012-06-22 DIAGNOSIS — I351 Nonrheumatic aortic (valve) insufficiency: Secondary | ICD-10-CM

## 2012-06-22 DIAGNOSIS — I359 Nonrheumatic aortic valve disorder, unspecified: Secondary | ICD-10-CM

## 2012-06-22 DIAGNOSIS — N183 Chronic kidney disease, stage 3 unspecified: Secondary | ICD-10-CM

## 2012-06-22 DIAGNOSIS — I4949 Other premature depolarization: Secondary | ICD-10-CM

## 2012-06-22 DIAGNOSIS — I255 Ischemic cardiomyopathy: Secondary | ICD-10-CM

## 2012-06-22 DIAGNOSIS — I2589 Other forms of chronic ischemic heart disease: Secondary | ICD-10-CM

## 2012-06-22 DIAGNOSIS — I2510 Atherosclerotic heart disease of native coronary artery without angina pectoris: Secondary | ICD-10-CM

## 2012-06-22 DIAGNOSIS — I493 Ventricular premature depolarization: Secondary | ICD-10-CM

## 2012-06-22 DIAGNOSIS — I5022 Chronic systolic (congestive) heart failure: Secondary | ICD-10-CM

## 2012-06-22 MED ORDER — CARVEDILOL 3.125 MG PO TABS: 3.1250 mg | ORAL_TABLET | Freq: Two times a day (BID) | ORAL | Status: DC

## 2012-06-22 MED ORDER — NITROGLYCERIN 0.4 MG SL SUBL: 0.4000 mg | SUBLINGUAL_TABLET | SUBLINGUAL | Status: AC | PRN

## 2012-06-22 MED ORDER — COLCHICINE 0.6 MG PO TABS: 0.6000 mg | ORAL_TABLET | Freq: Two times a day (BID) | ORAL | Status: DC

## 2012-06-22 MED ORDER — FUROSEMIDE 40 MG PO TABS: 40.0000 mg | ORAL_TABLET | Freq: Every day | ORAL | Status: DC

## 2012-06-22 MED ORDER — ISOSORBIDE MONONITRATE ER 30 MG PO TB24: 15.0000 mg | ORAL_TABLET | Freq: Every day | ORAL | Status: DC

## 2012-06-22 NOTE — Progress Notes (Signed)
[name] Date of Birth: 1934/04/15 Medical Record #[id number]  History of Present Illness: [name] is seen for followup of congestive heart failure. He was admitted in January with acute CHF exacerbation. He has a history of aortic insufficiency, atrial flutter, and non-Hodgkin's lymphoma. He also has chronic kidney disease stage III. When hospitalized he was treated with IV Lasix. He was started on carvedilol, nitrates, and hydralazine. He did not tolerate hydralazine because of hypotension. Echocardiogram at that time showed ejection fraction of 15% with diffuse hypokinesis. He had moderate aortic and mitral insufficiency. Subsequent Myoview study showed evidence of inferior apical myocardial infarction. He has consistently refused cardiac catheterization. On followup today he reports he is doing well except for painful feet. He denies any shortness of breath, edema, chest pain, or dizziness. His weight has been stable. According to our scales he has actually lost about 12 pounds. His feet are quite painful. He has pain in both first MTP joints pain diffusely in the feet. This is so bad that he is seen in a wheelchair today. He does have a history of gout in the past.   Current Outpatient Prescriptions on File Prior to Visit  Medication Sig Dispense Refill  . triamcinolone cream (KENALOG) 0.1 % Apply 1 application topically daily as needed. For rash       No current facility-administered medications on file prior to visit.    Allergies  Allergen Reactions  . Contrast Media (Iodinated Diagnostic Agents) Other (See Comments)    Patient does not want dye because it is hard on his kidneys  . Prednisone Rash    Past Medical History  Diagnosis Date  . Aortic insufficiency     a. Previously mod-severe;  b. 03/2012 Echo: Mild to Mod AI  . Atrial flutter     resolved  . Anemia   . PVCs (premature ventricular contractions)   . Chronic systolic CHF (congestive heart failure)     a.   03/2012 Echo: EF 15%, Mild to Mod AI, Mod MR  . Bilateral hydronephrosis     a. 03/2012 Renal u/s: Bilat Severe hydronephrosis, medical renal dzs.  . CKD (chronic kidney disease), stage III   . Non Hodgkin's lymphoma     resolved >10 yr ago  . Moderate mitral regurgitation     a.  03/2012 Echo: Mod MR  . BPH (benign prostatic hyperplasia)     a. noted on renal u/s 03/2012.  . Ischemic cardiomyopathy     a.  Lexiscan Myoview 04/24/12: Inferior apical MI, study not gated due to frequent PVCs, triplets => patient refuses cath    Past Surgical History  Procedure Laterality Date  . Lymph node dissection      Groin  . Inguinal hernia repair      History  Smoking status  . Never Smoker   Smokeless tobacco  . Never Used    History  Alcohol Use No    Family History  Problem Relation Age of Onset  . Prostate cancer Father     Review of Systems: As noted in history of present illness.  All other systems were reviewed and are negative.  Physical Exam: BP 120/60  Pulse 61  Ht 5\' 11"  (1.803 m)  Wt 136 lb (61.689 kg)  BMI 18.98 kg/m2  SpO2 99% He is an elderly white male seen in a wheelchair. HEENT: Normocephalic, atraumatic. Pupils equal round and reactive. Sclera clear. Oropharynx is clear. Neck: No JVD or bruits. Lungs: Clear Cardiac: Irregular  rate and rhythm with normal S1 and S2. There is a grade 2/6 diastolic murmur left sternal border. PMI is normal. Abdomen: Soft and nontender. No masses. Extremities: No edema. Skin: Warm and dry Neuro: Alert and oriented x3. Cranial nerves II through XII are intact. LABORATORY DATA: Laboratory data from the nephrologist in February showed a creatinine of 2.32. Hemoglobin was 11.4.  Assessment / Plan: 1. Chronic systolic congestive heart failure. Ejection fraction of 15%. His weight is stable, in fact decreased. No edema noted. We will continue his current dose of Lasix 40 mg daily. Continue low-dose isosorbide and carvedilol. 2.  Acute foot pain. His history is consistent with an acute gout flare. We'll try colchicine 0.6 mg twice a day. 3. Ischemic cardiomyopathy. Patient refuses further evaluation with cardiac catheterization. 4. Chronic kidney disease stage III 5. Chronic valvular heart disease with moderate aortic and mitral insufficiency.  I will followup again in 3 months.

## 2012-06-22 NOTE — Patient Instructions (Signed)
Continue your current cardiac medication  Try colchicine 0.6 mg twice a day for gout.  I will see you in 3 months.

## 2012-11-08 ENCOUNTER — Encounter: Payer: Self-pay | Admitting: Cardiology

## 2012-11-09 ENCOUNTER — Encounter: Payer: Self-pay | Admitting: Cardiology

## 2013-01-29 ENCOUNTER — Inpatient Hospital Stay (HOSPITAL_COMMUNITY): Payer: Medicare Other

## 2013-01-29 ENCOUNTER — Emergency Department (HOSPITAL_COMMUNITY): Payer: Medicare Other

## 2013-01-29 ENCOUNTER — Encounter (HOSPITAL_COMMUNITY): Payer: Self-pay | Admitting: Emergency Medicine

## 2013-01-29 ENCOUNTER — Inpatient Hospital Stay (HOSPITAL_COMMUNITY)
Admission: EM | Admit: 2013-01-29 | Discharge: 2013-02-08 | DRG: 186 | Disposition: A | Discharge status: Skilled Nursing Facility | Payer: Medicare Other | Attending: Internal Medicine | Admitting: Internal Medicine

## 2013-01-29 DIAGNOSIS — I472 Ventricular tachycardia, unspecified: Secondary | ICD-10-CM | POA: Diagnosis present

## 2013-01-29 DIAGNOSIS — Z681 Body mass index (BMI) 19 or less, adult: Secondary | ICD-10-CM

## 2013-01-29 DIAGNOSIS — N183 Chronic kidney disease, stage 3 unspecified: Secondary | ICD-10-CM

## 2013-01-29 DIAGNOSIS — I5021 Acute systolic (congestive) heart failure: Secondary | ICD-10-CM | POA: Diagnosis present

## 2013-01-29 DIAGNOSIS — E875 Hyperkalemia: Secondary | ICD-10-CM

## 2013-01-29 DIAGNOSIS — D638 Anemia in other chronic diseases classified elsewhere: Secondary | ICD-10-CM | POA: Diagnosis present

## 2013-01-29 DIAGNOSIS — I340 Nonrheumatic mitral (valve) insufficiency: Secondary | ICD-10-CM

## 2013-01-29 DIAGNOSIS — I4891 Unspecified atrial fibrillation: Secondary | ICD-10-CM

## 2013-01-29 DIAGNOSIS — J189 Pneumonia, unspecified organism: Secondary | ICD-10-CM

## 2013-01-29 DIAGNOSIS — I351 Nonrheumatic aortic (valve) insufficiency: Secondary | ICD-10-CM

## 2013-01-29 DIAGNOSIS — A491 Streptococcal infection, unspecified site: Secondary | ICD-10-CM | POA: Diagnosis present

## 2013-01-29 DIAGNOSIS — C8589 Other specified types of non-Hodgkin lymphoma, extranodal and solid organ sites: Secondary | ICD-10-CM | POA: Diagnosis present

## 2013-01-29 DIAGNOSIS — J90 Pleural effusion, not elsewhere classified: Principal | ICD-10-CM

## 2013-01-29 DIAGNOSIS — I059 Rheumatic mitral valve disease, unspecified: Secondary | ICD-10-CM | POA: Diagnosis present

## 2013-01-29 DIAGNOSIS — I4892 Unspecified atrial flutter: Secondary | ICD-10-CM | POA: Diagnosis not present

## 2013-01-29 DIAGNOSIS — Z79899 Other long term (current) drug therapy: Secondary | ICD-10-CM

## 2013-01-29 DIAGNOSIS — I480 Paroxysmal atrial fibrillation: Secondary | ICD-10-CM

## 2013-01-29 DIAGNOSIS — N1330 Unspecified hydronephrosis: Secondary | ICD-10-CM

## 2013-01-29 DIAGNOSIS — I509 Heart failure, unspecified: Secondary | ICD-10-CM

## 2013-01-29 DIAGNOSIS — I4729 Other ventricular tachycardia: Secondary | ICD-10-CM | POA: Diagnosis present

## 2013-01-29 DIAGNOSIS — N400 Enlarged prostate without lower urinary tract symptoms: Secondary | ICD-10-CM | POA: Diagnosis present

## 2013-01-29 DIAGNOSIS — J9600 Acute respiratory failure, unspecified whether with hypoxia or hypercapnia: Secondary | ICD-10-CM

## 2013-01-29 DIAGNOSIS — R0600 Dyspnea, unspecified: Secondary | ICD-10-CM

## 2013-01-29 DIAGNOSIS — I5022 Chronic systolic (congestive) heart failure: Secondary | ICD-10-CM

## 2013-01-29 DIAGNOSIS — Z66 Do not resuscitate: Secondary | ICD-10-CM | POA: Diagnosis not present

## 2013-01-29 DIAGNOSIS — J9819 Other pulmonary collapse: Secondary | ICD-10-CM

## 2013-01-29 DIAGNOSIS — J869 Pyothorax without fistula: Secondary | ICD-10-CM

## 2013-01-29 DIAGNOSIS — I255 Ischemic cardiomyopathy: Secondary | ICD-10-CM

## 2013-01-29 DIAGNOSIS — I959 Hypotension, unspecified: Secondary | ICD-10-CM | POA: Diagnosis not present

## 2013-01-29 DIAGNOSIS — I359 Nonrheumatic aortic valve disorder, unspecified: Secondary | ICD-10-CM | POA: Diagnosis present

## 2013-01-29 DIAGNOSIS — I2510 Atherosclerotic heart disease of native coronary artery without angina pectoris: Secondary | ICD-10-CM

## 2013-01-29 DIAGNOSIS — I493 Ventricular premature depolarization: Secondary | ICD-10-CM

## 2013-01-29 DIAGNOSIS — M109 Gout, unspecified: Secondary | ICD-10-CM | POA: Diagnosis present

## 2013-01-29 DIAGNOSIS — Z515 Encounter for palliative care: Secondary | ICD-10-CM

## 2013-01-29 DIAGNOSIS — I2589 Other forms of chronic ischemic heart disease: Secondary | ICD-10-CM | POA: Diagnosis present

## 2013-01-29 DIAGNOSIS — I5023 Acute on chronic systolic (congestive) heart failure: Secondary | ICD-10-CM | POA: Diagnosis present

## 2013-01-29 DIAGNOSIS — E43 Unspecified severe protein-calorie malnutrition: Secondary | ICD-10-CM

## 2013-01-29 HISTORY — DX: Pleural effusion, not elsewhere classified: J90

## 2013-01-29 LAB — CBC WITH DIFFERENTIAL/PLATELET
Basophils Absolute: 0 10*3/uL (ref 0.0–0.1)
Basophils Relative: 0 % (ref 0–1)
Eosinophils Relative: 0 % (ref 0–5)
Lymphocytes Relative: 10 % — ABNORMAL LOW (ref 12–46)
MCHC: 34 g/dL (ref 30.0–36.0)
Monocytes Relative: 5 % (ref 3–12)
Neutro Abs: 6 10*3/uL (ref 1.7–7.7)
Platelets: 290 10*3/uL (ref 150–400)
RDW: 15 % (ref 11.5–15.5)
WBC: 7.1 10*3/uL (ref 4.0–10.5)

## 2013-01-29 LAB — POCT I-STAT, CHEM 8
BUN: 62 mg/dL — ABNORMAL HIGH (ref 6–23)
Calcium, Ion: 1.22 mmol/L (ref 1.13–1.30)
Chloride: 108 mEq/L (ref 96–112)
Creatinine, Ser: 2.4 mg/dL — ABNORMAL HIGH (ref 0.50–1.35)
HCT: 29 % — ABNORMAL LOW (ref 39.0–52.0)
Hemoglobin: 9.9 g/dL — ABNORMAL LOW (ref 13.0–17.0)
Potassium: 4.3 mEq/L (ref 3.5–5.1)
TCO2: 19 mmol/L (ref 0–100)

## 2013-01-29 LAB — POCT I-STAT TROPONIN I: Troponin i, poc: 0.04 ng/mL (ref 0.00–0.08)

## 2013-01-29 LAB — PRO B NATRIURETIC PEPTIDE: Pro B Natriuretic peptide (BNP): 11562 pg/mL — ABNORMAL HIGH (ref 0–450)

## 2013-01-29 MED ORDER — HYDROCODONE-ACETAMINOPHEN 5-325 MG PO TABS
1.0000 | ORAL_TABLET | ORAL | Status: DC | PRN
  Administered 2013-01-30: 2 via ORAL
  Administered 2013-02-01: 1 via ORAL
  Filled 2013-01-29: qty 2

## 2013-01-29 MED ORDER — NITROGLYCERIN 0.4 MG SL SUBL: 0.4000 mg | SUBLINGUAL_TABLET | SUBLINGUAL | Status: DC | PRN

## 2013-01-29 MED ORDER — SODIUM CHLORIDE 0.9 % IJ SOLN
3.0000 mL | Freq: Two times a day (BID) | INTRAMUSCULAR | Status: DC
  Administered 2013-01-29 – 2013-01-30 (×3): 3 mL via INTRAVENOUS

## 2013-01-29 MED ORDER — SODIUM CHLORIDE 0.9 % IJ SOLN: 3.0000 mL | INTRAMUSCULAR | Status: DC | PRN

## 2013-01-29 MED ORDER — ACETAMINOPHEN 325 MG PO TABS: 650.0000 mg | ORAL_TABLET | Freq: Four times a day (QID) | ORAL | Status: DC | PRN

## 2013-01-29 MED ORDER — SODIUM CHLORIDE 0.9 % IV SOLN
250.0000 mL | INTRAVENOUS | Status: DC | PRN
Start: 2013-01-29 — End: 2013-02-01

## 2013-01-29 MED ORDER — HEPARIN SODIUM (PORCINE) 5000 UNIT/ML IJ SOLN
5000.0000 [IU] | Freq: Three times a day (TID) | INTRAMUSCULAR | Status: AC
  Administered 2013-01-29 – 2013-02-01 (×8): 5000 [IU] via SUBCUTANEOUS
  Filled 2013-01-29 (×9): qty 1

## 2013-01-29 MED ORDER — ACETAMINOPHEN 650 MG RE SUPP: 650.0000 mg | Freq: Four times a day (QID) | RECTAL | Status: DC | PRN

## 2013-01-29 MED ORDER — SODIUM CHLORIDE 0.9 % IJ SOLN
3.0000 mL | Freq: Two times a day (BID) | INTRAMUSCULAR | Status: DC
  Administered 2013-01-29 – 2013-01-30 (×2): 3 mL via INTRAVENOUS

## 2013-01-29 NOTE — ED Notes (Signed)
Brother R[facility]son, [phone] cell     [phone]  Home

## 2013-01-29 NOTE — Consult Note (Signed)
CARDIOLOGY CONSULT NOTE   Patient ID: [name] MRN: [id number], DOB/AGE: 76/25/1936   Admit date: 01/29/2013 Date of Consult: 01/29/2013  Primary Physician: [name], MD Primary Cardiologist: P. [place], MD   Pt. Profile  78 y/o male with h/o systolic CHF who presented to the ED today with recurrent dyspnea.  Problem List  Past Medical History  Diagnosis Date  . Aortic insufficiency     a. Previously mod-severe;  b. 03/2012 Echo: Mild to Mod AI  . Atrial flutter     resolved  . Anemia   . PVCs (premature ventricular contractions)   . Chronic systolic CHF (congestive heart failure)     a.  03/2012 Echo: EF 15%, Mild to Mod AI, Mod MR  . Bilateral hydronephrosis     a. 03/2012 Renal u/s: Bilat Severe hydronephrosis, medical renal dzs.  . CKD (chronic kidney disease), stage III   . Non Hodgkin's lymphoma     resolved >10 yr ago  . Moderate mitral regurgitation     a.  03/2012 Echo: Mod MR  . BPH (benign prostatic hyperplasia)     a. noted on renal u/s 03/2012.  . Ischemic cardiomyopathy     a.  Lexiscan Myoview 04/24/12: Inferior apical MI, study not gated due to frequent PVCs, triplets => patient refuses cath  . Gout     Past Surgical History  Procedure Laterality Date  . Lymph node dissection      Groin  . Inguinal hernia repair      Allergies  Allergies  Allergen Reactions  . Contrast Media [Iodinated Diagnostic Agents] Other (See Comments)    Patient does not want dye because it is hard on his kidneys  . Prednisone Rash  . Shellfish Allergy Itching and Rash    Crab meat   HPI   78 year old male with prior history of presumed ischemic cardiomyopathy and chronic systolic congestive heart failure with an EF of 15% who is actually been doing quite well at home, walking on a treadmill about 40 minutes daily. He is on very little medication at home and is very well compensated overall. He was in his usual state of health until about a week ago, when  he began to experience progressive dyspnea exertion in the absence of PND, orthopnea, early satiety, abdominal bloating, weight gain, or significant lower extremity edema. As result, he has not been taking additional Lasix because he has not felt as though he's been volume overloaded. He watches his diet very strictly and avoid sodium at all costs. Again, his weight has been stable. Due to progressive dyspnea, he presented to the ED today where his proBNP is elevated in the 11,000 range and chest x-ray suggested left-sided pleural effusion. We have been asked to consult with regards to his chronic heart failure and pleural effusion. Patient says that since admission, his breathing has improved some without intervention.  A decubitus film has been performed and shows no layering and recommendation has been made for CT of the chest for followup and to rule out loculated effusion, versus consolidation, versus mass.  Home Medications  Prior to Admission medications   Medication Sig Start Date End Date Taking? Authorizing Provider  furosemide (LASIX) 40 MG tablet Take 40 mg by mouth daily as needed for fluid.   Yes Historical Provider, MD  nitroGLYCERIN (NITROSTAT) 0.4 MG SL tablet Place 1 tablet (0.4 mg total) under the tongue every 5 (five) minutes x 3 doses as needed for chest pain. 06/22/12  Yes Peter M Sw[place], MD  triamcinolone cream (KENALOG) 0.1 % Apply 1 application topically daily as needed. For rash   Yes Historical Provider, MD    Family History Family History  Problem Relation Age of Onset  . Prostate cancer Father     Social History History   Social History  . Marital Status: Legally Separated    Spouse Name: N/A    Number of Children: 1  . Years of Education: N/A   Occupational History  .     Social History Main Topics  . Smoking status: Never Smoker   . Smokeless tobacco: Never Used  . Alcohol Use: No  . Drug Use: No  . Sexual Activity: Not on file   Other Topics Concern  .  Not on file   Social History Narrative  . No narrative on file    Review of Systems  General:  No chills, fever, night sweats or weight changes.  Cardiovascular:  No chest pain, positive dyspnea on exertion, no edema, orthopnea, palpitations, paroxysmal nocturnal dyspnea. Dermatological: No rash, lesions/masses Respiratory: No cough, positive dyspnea Urologic: No hematuria, dysuria Abdominal:   No nausea, vomiting, diarrhea, bright red blood per rectum, melena, or hematemesis Neurologic:  No visual changes, wkns, changes in mental status. All other systems reviewed and are otherwise negative except as noted above.  Physical Exam  Blood pressure 96/56, pulse 113, temperature 98.1 F (36.7 C), temperature source Oral, resp. rate 18, height 6" (0.152 m), weight 126 lb 14.4 oz (57.561 kg), SpO2 92.00%.  General: Pleasant, NAD Psych: Normal affect. Neuro: Alert and oriented X 3. Moves all extremities spontaneously. HEENT: Normal  Neck: Supple without bruits. JVP approximately 7 cm. Lungs:  Resp regular and unlabored, CTA with the exception of diminished breath sounds in the left base. Heart: RRR no s3, s4, or murmurs. Abdomen: Soft, non-tender, non-distended, BS + x 4.  Extremities: No clubbing, cyanosis or edema. DP/PT/Radials 2+ and equal bilaterally.  Labs  Trop i, poc: 0.04  Lab Results  Component Value Date   WBC 7.1 01/29/2013   HGB 9.9* 01/29/2013   HCT 29.0* 01/29/2013   MCV 83.6 01/29/2013   PLT 290 01/29/2013     Recent Labs Lab 01/29/13 1105  NA 140  K 4.3  CL 108  BUN 62*  CREATININE 2.40*  GLUCOSE 101*   Lab Results  Component Value Date   CHOL 138 04/13/2012   HDL 60 04/13/2012   LDLCALC 64 04/13/2012   TRIG 68 04/13/2012   Radiology/Studies  Dg Chest 2 View  01/29/2013   CLINICAL DATA:  Shortness of breath, productive cough, weakness, and weight loss. There is history of coronary artery disease, CHF, and chronic renal failure.  EXAM: CHEST  2 VIEW    IMPRESSION: There has been interval increase in the size of the left pleural effusion. No significant effusion on the right is demonstrated today.   Electronically Signed   By: David  [place]   On: 01/29/2013 11:21   ECG  Rsr, 105, pac's, pvc's, no acute st/t changes  ASSESSMENT AND PLAN  1.  Acute resp failure:  Pt presents with progressive doe over the past week in the absence of orthopnea, pnd, significant LEE, change in weight, appetite, or abd girth.  On exam, he is euvolemic.  CXR is markedly abnl and f/u CT w/o contrast has been ordered.  Rec f/u CT and consider pulmonology consult.  Would not diurese.  2.  Chronic systolic chf/ICM:  euvolemic on  exam.  Cont home meds.  Does not appear to require diuresis at this time.  BNP elevated in setting of CKD and creat of 2.4.  3.  CKD III:  Stable.  Signed, Nicolasa Ducking, NP 01/29/2013, 6:00 PM Patient seen and examined. I agree with the assessment and plan as detailed above. See also my additional thoughts below.   I reviewed the data carefully with Mr. Brion Aliment. I examined the patient. The chest x-ray abnormality is concerning. It does not appear to be classic CHF. The recommendation is to not push his diuresis. He should be continued on his same CHF meds. Further evaluation of the chest x-ray abnormality is recommended.  Willa Rough, MD, [facility] 01/29/2013 6:25 PM

## 2013-01-29 NOTE — ED Notes (Signed)
Pt given turkey sandwich and water

## 2013-01-29 NOTE — ED Notes (Signed)
Pt reports exertional SOB for about the last week. Denies fever. Pt alert, oriented x4, resp even, clear breath sounds noted at present.

## 2013-01-29 NOTE — ED Notes (Signed)
Pt returned from Radiology.

## 2013-01-29 NOTE — ED Provider Notes (Signed)
CSN: [id number]     Arrival date & time 01/29/13  1013 History   First MD Initiated Contact with Patient 01/29/13 1024     Chief Complaint  Patient presents with  . Shortness of Breath   (Consider location/radiation/quality/duration/timing/severity/associated sxs/prior Treatment) HPI [place] of shortness of breath with dyspnea on exertion and cough productive of clear or green sputum for the past week. He denies orthopnea. Sleep on one pillow. Denies chest pain. Denies fever. No treatment prior to coming here. Shortness of breath is worse with exertion improved with rest. He is asymptomatic as I examine him. Past Medical History  Diagnosis Date  . Aortic insufficiency     a. Previously mod-severe;  b. 03/2012 Echo: Mild to Mod AI  . Atrial flutter     resolved  . Anemia   . PVCs (premature ventricular contractions)   . Chronic systolic CHF (congestive heart failure)     a.  03/2012 Echo: EF 15%, Mild to Mod AI, Mod MR  . Bilateral hydronephrosis     a. 03/2012 Renal u/s: Bilat Severe hydronephrosis, medical renal dzs.  . CKD (chronic kidney disease), stage III   . Non Hodgkin's lymphoma     resolved >10 yr ago  . Moderate mitral regurgitation     a.  03/2012 Echo: Mod MR  . BPH (benign prostatic hyperplasia)     a. noted on renal u/s 03/2012.  . Ischemic cardiomyopathy     a.  Lexiscan Myoview 04/24/12: Inferior apical MI, study not gated due to frequent PVCs, triplets => patient refuses cath  . Gout    Past Surgical History  Procedure Laterality Date  . Lymph node dissection      Groin  . Inguinal hernia repair     Family History  Problem Relation Age of Onset  . Prostate cancer Father    History  Substance Use Topics  . Smoking status: Never Smoker   . Smokeless tobacco: Never Used  . Alcohol Use: No    Review of Systems  Constitutional: Negative.   HENT: Negative.   Respiratory: Positive for cough and shortness of breath.   Cardiovascular: Negative.    Gastrointestinal: Negative.   Musculoskeletal: Negative.   Skin: Negative.   Neurological: Negative.   Psychiatric/Behavioral: Negative.   All other systems reviewed and are negative.    Allergies  Contrast media; Prednisone; and Shellfish allergy  Home Medications   Current Outpatient Rx  Name  Route  Sig  Dispense  Refill  . furosemide (LASIX) 40 MG tablet   Oral   Take 40 mg by mouth daily as needed for fluid.         . nitroGLYCERIN (NITROSTAT) 0.4 MG SL tablet   Sublingual   Place 1 tablet (0.4 mg total) under the tongue every 5 (five) minutes x 3 doses as needed for chest pain.   25 tablet   3   . triamcinolone cream (KENALOG) 0.1 %   Topical   Apply 1 application topically daily as needed. For rash          BP 102/79  Pulse 83  Temp(Src) 98.6 F (37 C) (Oral)  Resp 22  SpO2 100% Physical Exam  Nursing note and vitals reviewed. Constitutional: He is oriented to person, place, and time. He appears well-developed and well-nourished.  HENT:  Head: Normocephalic and atraumatic.  Eyes: Conjunctivae are normal. Pupils are equal, round, and reactive to light.  Neck: Neck supple. JVD present. No tracheal deviation present. No  thyromegaly present.  Cardiovascular: Normal rate.   No murmur heard. Irregular  Pulmonary/Chest: Effort normal and breath sounds normal.  Abdominal: Soft. Bowel sounds are normal. He exhibits no distension. There is no tenderness.  Musculoskeletal: Normal range of motion. He exhibits no edema and no tenderness.  Neurological: He is alert and oriented to person, place, and time. Coordination normal.  Skin: Skin is warm and dry. No rash noted.  Psychiatric: He has a normal mood and affect.    ED Course  Procedures (including critical care time) Labs Review Labs Reviewed - No data to display Imaging Review No results found.  EKG Interpretation     Ventricular Rate:  105 PR Interval:  196 QRS Duration: 101 QT  Interval:  353 QTC Calculation: 466 R Axis:   64 Text Interpretation:  Sinus arrhythmia Multiform ventricular premature complexes Borderline repolarization abnormality No significant change since last tracing           chest x-ray viewed by me Results for orders placed during the hospital encounter of 01/29/13  CBC WITH DIFFERENTIAL      Result Value Range   WBC 7.1  4.0 - 10.5 K/uL   RBC 3.17 (*) 4.22 - 5.81 MIL/uL   Hemoglobin 9.0 (*) 13.0 - 17.0 g/dL   HCT 52.8 (*) 41.3 - 24.4 %   MCV 83.6  78.0 - 100.0 fL   MCH 28.4  26.0 - 34.0 pg   MCHC 34.0  30.0 - 36.0 g/dL   RDW 01.0  27.2 - 53.6 %   Platelets 290  150 - 400 K/uL   Neutrophils Relative % 86 (*) 43 - 77 %   Neutro Abs 6.0  1.7 - 7.7 K/uL   Lymphocytes Relative 10 (*) 12 - 46 %   Lymphs Abs 0.7  0.7 - 4.0 K/uL   Monocytes Relative 5  3 - 12 %   Monocytes Absolute 0.3  0.1 - 1.0 K/uL   Eosinophils Relative 0  0 - 5 %   Eosinophils Absolute 0.0  0.0 - 0.7 K/uL   Basophils Relative 0  0 - 1 %   Basophils Absolute 0.0  0.0 - 0.1 K/uL  PRO B NATRIURETIC PEPTIDE      Result Value Range   Pro B Natriuretic peptide (BNP) 11562.0 (*) 0 - 450 pg/mL  POCT I-STAT, CHEM 8      Result Value Range   Sodium 140  135 - 145 mEq/L   Potassium 4.3  3.5 - 5.1 mEq/L   Chloride 108  96 - 112 mEq/L   BUN 62 (*) 6 - 23 mg/dL   Creatinine, Ser 6.44 (*) 0.50 - 1.35 mg/dL   Glucose, Bld 034 (*) 70 - 99 mg/dL   Calcium, Ion 7.42  5.95 - 1.30 mmol/L   TCO2 19  0 - 100 mmol/L   Hemoglobin 9.9 (*) 13.0 - 17.0 g/dL   HCT 63.8 (*) 75.6 - 43.3 %  POCT I-STAT TROPONIN I      Result Value Range   Troponin i, poc 0.04  0.00 - 0.08 ng/mL   Comment 3            Dg Chest 2 View  01/29/2013   CLINICAL DATA:  Shortness of breath, productive cough, weakness, and weight loss. There is history of coronary artery disease, CHF, and chronic renal failure.  EXAM: CHEST  2 VIEW  COMPARISON:  April 15, 2012.  FINDINGS: There is volume loss on the left  likely  secondary to a pleural effusion. On the right the lung is well-expanded and clear. The cardiac silhouette is top-normal in size. The pulmonary vascularity is not engorged. There is tortuosity of the descending thoracic aorta. There is curvature of the thoracic spine with the convexity towards the right.  IMPRESSION: There has been interval increase in the size of the left pleural effusion. No significant effusion on the right is demonstrated today.   Electronically Signed   By: David  [place]and   On: 01/29/2013 11:21    MDM  No diagnosis found. Patient likely needs thoracentesis. May need gentle diuresis as well. Spoke with Dr.Regaldo who will admit to telemetry. She will obtain cardiology consult Diagnosis #1 acute dyspnea #2 right-sided pleural effusion #3 chronic renal insufficiency     Doug Sou, MD 01/29/13 1350

## 2013-01-29 NOTE — H&P (Signed)
[facility] History and Physical  [name] RUE:[id number] DOB: 1934-07-27 DOA: 01/29/2013  Referring physician: Dr Ethelda Chick PCP: [name], MD  Specialists: [facility]  Chief Complaint: SOB  HPI: Jaquis Picklesimer is a 78 y.o. male with PMH significant for non ischemic cardiomyopathy, systolic heart failure EF 15 % by ECHO 03-2012, CKD stage III, Cr baseline 2.4, chronic hydronephrosis, Non Hodgkin lymphoma, who presents to the ED complaining of progressive SOB on exertion for last week. He relates dyspnea on exertion, he has to stop exercising in the treadmill due to dyspnea. He usually exercise for 40 minutes daily. He denies chest pain, nausea, vomiting. He is only taking 10 mg of lasix.  He is suppose to be taking 40 mg.  He follow up with Dr Web this year. He was suppose to see him again in October but missed the appointment.  He has not follow up with urology.   Review of Systems: Negative except as per HPI.   Past Medical History  Diagnosis Date  . Aortic insufficiency     a. Previously mod-severe;  b. 03/2012 Echo: Mild to Mod AI  . Atrial flutter     resolved  . Anemia   . PVCs (premature ventricular contractions)   . Chronic systolic CHF (congestive heart failure)     a.  03/2012 Echo: EF 15%, Mild to Mod AI, Mod MR  . Bilateral hydronephrosis     a. 03/2012 Renal u/s: Bilat Severe hydronephrosis, medical renal dzs.  . CKD (chronic kidney disease), stage III   . Non Hodgkin's lymphoma     resolved >10 yr ago  . Moderate mitral regurgitation     a.  03/2012 Echo: Mod MR  . BPH (benign prostatic hyperplasia)     a. noted on renal u/s 03/2012.  . Ischemic cardiomyopathy     a.  Lexiscan Myoview 04/24/12: Inferior apical MI, study not gated due to frequent PVCs, triplets => patient refuses cath  . Gout    Past Surgical History  Procedure Laterality Date  . Lymph node dissection      Groin  . Inguinal hernia repair     Social History:   reports that he has never smoked. He has never used smokeless tobacco. He reports that he does not drink alcohol or use illicit drugs.Lives by himself at home. He has Childrens and grandchildren.    Allergies  Allergen Reactions  . Contrast Media [Iodinated Diagnostic Agents] Other (See Comments)    Patient does not want dye because it is hard on his kidneys  . Prednisone Rash  . Shellfish Allergy Itching and Rash    Crab meat    Family History  Problem Relation Age of Onset  . Prostate cancer Father        Mother: History of heart Diseases.   Prior to Admission medications   Medication Sig Start Date End Date Taking? Authorizing Provider  furosemide (LASIX) 40 MG tablet Take 40 mg by mouth daily as needed for fluid.   Yes Historical Provider, MD  nitroGLYCERIN (NITROSTAT) 0.4 MG SL tablet Place 1 tablet (0.4 mg total) under the tongue every 5 (five) minutes x 3 doses as needed for chest pain. 06/22/12  Yes Peter M [place], MD  triamcinolone cream (KENALOG) 0.1 % Apply 1 application topically daily as needed. For rash   Yes Historical Provider, MD   Physical Exam: Filed Vitals:   01/29/13 1230  BP: 90/66  Pulse:   Temp:   Resp: 23  General Appearance:    Alert, cooperative, no distress, appears stated age  Head:    Normocephalic, without obvious abnormality, atraumatic  Eyes:    PERRL, conjunctiva/corneas clear, EOM's intact.           Ears:    Normal TM's and external ear canals, both ears  Nose:   Nares normal, septum midline, mucosa normal, no drainage    or sinus tenderness  Throat:   Lips, mucosa, and tongue normal; teeth and gums normal  Neck:   Supple, symmetrical, trachea midline, no adenopathy;       thyroid:  No enlargement/tenderness/nodules; no carotid   Bruit, positive  JVD  Back:     Symmetric, no curvature, ROM normal, no CVA tenderness  Lungs:     Decrease breath sound left side, crackles on the right, respirations unlabored     Heart:    Regular rate and  rhythm, S1 and S2 normal, no murmur, rub   or gallop  Abdomen:     Soft, non-tender, bowel sounds active all four quadrants,    no masses, no organomegaly        Extremities:   Extremities normal, atraumatic, no cyanosis or edema  Pulses:   2+ and symmetric all extremities  Skin:   Skin color, texture, turgor normal, no rashes or lesions  Lymph nodes:   Cervical, supraclavicular, and axillary nodes normal  Neurologic:   CNII-XII intact. Normal strength, sensation and reflexes      throughout      Labs on Admission:  Basic Metabolic Panel:  Recent Labs Lab 01/29/13 1105  NA 140  K 4.3  CL 108  GLUCOSE 101*  BUN 62*  CREATININE 2.40*   Liver Function Tests: No results found for this basename: AST, ALT, ALKPHOS, BILITOT, PROT, ALBUMIN,  in the last 168 hours No results found for this basename: LIPASE, AMYLASE,  in the last 168 hours No results found for this basename: AMMONIA,  in the last 168 hours CBC:  Recent Labs Lab 01/29/13 1050 01/29/13 1105  WBC 7.1  --   NEUTROABS 6.0  --   HGB 9.0* 9.9*  HCT 26.5* 29.0*  MCV 83.6  --   PLT 290  --    Cardiac Enzymes: No results found for this basename: CKTOTAL, CKMB, CKMBINDEX, TROPONINI,  in the last 168 hours  BNP (last 3 results)  Recent Labs  04/12/12 1224 01/29/13 1050  PROBNP 18809.0* 11562.0*   CBG: No results found for this basename: GLUCAP,  in the last 168 hours  Radiological Exams on Admission: Dg Chest 2 View  01/29/2013   CLINICAL DATA:  Shortness of breath, productive cough, weakness, and weight loss. There is history of coronary artery disease, CHF, and chronic renal failure.  EXAM: CHEST  2 VIEW  COMPARISON:  April 15, 2012.  FINDINGS: There is volume loss on the left likely secondary to a pleural effusion. On the right the lung is well-expanded and clear. The cardiac silhouette is top-normal in size. The pulmonary vascularity is not engorged. There is tortuosity of the descending thoracic aorta.  There is curvature of the thoracic spine with the convexity towards the right.  IMPRESSION: There has been interval increase in the size of the left pleural effusion. No significant effusion on the right is demonstrated today.   Electronically Signed   By: David  [place]   On: 01/29/2013 11:21    EKG: Independently reviewed. PVC, sinus.   Assessment/Plan Principal Problem:   Acute systolic  CHF (congestive heart failure) Active Problems:   Aortic insufficiency   CKD (chronic kidney disease), stage III   Bilateral hydronephrosis   Cardiomyopathy, ischemic   CAD (coronary artery disease)   Pleural effusion, left  1-Dyspnea; secondary to left side pleural effusion, acute on chronic systolic heart failure exacerbation. Patient with dyspnea, increase BNP at 11562, JVD. Will admit patient to telemetry, cycle cardiac enzymes, daily weight, strict I and O. I have consulted cardiology. Does the patient need lasix Gtt, SBP soft in the high 90 ?Marland Kitchen Should we proceed with thoracentesis ?  Will follow cardiology recommendation. NPO in case of thoracentesis needed.   2-CKD stage III: Cr baseline 2.4. He has prior history of bilateral hydronephrosis. He never follow up with urology. Will repeat renal US, depending on result might need urology evaluation.   3-Anemia: probably anemia of chronic diseases. Hb at 9.9. Check anemia panel.   Code Status: Full Code.  Family Communication: Care discussed with patient, brother was at bedside.  Disposition Plan: expect 2 to 3 days inpatient.   Time spent: 65 minutes.   REGALADO,BELKYS [facility] Pager [phone]  If 7PM-7AM, please contact night-coverage [email or web address] Password [email or web address] 01/29/2013, 2:04 PM

## 2013-01-29 NOTE — ED Notes (Signed)
Heart Healthy diet ordered spoke with Kenney Houseman

## 2013-01-30 ENCOUNTER — Inpatient Hospital Stay (HOSPITAL_COMMUNITY): Payer: Medicare Other

## 2013-01-30 DIAGNOSIS — I2589 Other forms of chronic ischemic heart disease: Secondary | ICD-10-CM

## 2013-01-30 DIAGNOSIS — I059 Rheumatic mitral valve disease, unspecified: Secondary | ICD-10-CM

## 2013-01-30 DIAGNOSIS — J90 Pleural effusion, not elsewhere classified: Principal | ICD-10-CM

## 2013-01-30 DIAGNOSIS — J869 Pyothorax without fistula: Secondary | ICD-10-CM | POA: Diagnosis present

## 2013-01-30 DIAGNOSIS — R0609 Other forms of dyspnea: Secondary | ICD-10-CM

## 2013-01-30 DIAGNOSIS — J189 Pneumonia, unspecified organism: Secondary | ICD-10-CM | POA: Diagnosis present

## 2013-01-30 DIAGNOSIS — I2510 Atherosclerotic heart disease of native coronary artery without angina pectoris: Secondary | ICD-10-CM

## 2013-01-30 HISTORY — DX: Pneumonia, unspecified organism: J18.9

## 2013-01-30 HISTORY — DX: Pyothorax without fistula: J86.9

## 2013-01-30 LAB — BASIC METABOLIC PANEL
CO2: 17 mEq/L — ABNORMAL LOW (ref 19–32)
Calcium: 8.4 mg/dL (ref 8.4–10.5)
Chloride: 106 mEq/L (ref 96–112)
Creatinine, Ser: 2.33 mg/dL — ABNORMAL HIGH (ref 0.50–1.35)
GFR calc Af Amer: 30 mL/min — ABNORMAL LOW (ref >90)
Glucose, Bld: 200 mg/dL — ABNORMAL HIGH (ref 70–99)
Potassium: 4.9 mEq/L (ref 3.5–5.1)
Sodium: 137 mEq/L (ref 135–145)

## 2013-01-30 LAB — BODY FLUID CELL COUNT WITH DIFFERENTIAL: Total Nucleated Cell Count, Fluid: 47360 cu mm — ABNORMAL HIGH (ref 0–1000)

## 2013-01-30 LAB — HEMATOCRIT, BODY FLUID: Hematocrit, Fluid: <2 %

## 2013-01-30 LAB — PROTEIN, TOTAL: Total Protein: 7.1 g/dL (ref 6.0–8.3)

## 2013-01-30 LAB — CBC
HCT: 26.1 % — ABNORMAL LOW (ref 39.0–52.0)
MCV: 83.1 fL (ref 78.0–100.0)
Platelets: 288 10*3/uL (ref 150–400)
RBC: 3.14 MIL/uL — ABNORMAL LOW (ref 4.22–5.81)
RDW: 15.1 % (ref 11.5–15.5)
WBC: 6.9 10*3/uL (ref 4.0–10.5)

## 2013-01-30 LAB — RETICULOCYTES: Retic Count, Absolute: 47.1 10*3/uL (ref 19.0–186.0)

## 2013-01-30 LAB — IRON AND TIBC
Iron: 15 ug/dL — ABNORMAL LOW (ref 42–135)
TIBC: 140 ug/dL — ABNORMAL LOW (ref 215–435)
UIBC: 125 ug/dL (ref 125–400)

## 2013-01-30 LAB — CREATININE, FLUID (PLEURAL, PERITONEAL, JP DRAINAGE)

## 2013-01-30 LAB — PROTEIN, BODY FLUID: Total protein, fluid: 3.1 g/dL

## 2013-01-30 LAB — FOLATE: Folate: 13.4 ng/mL

## 2013-01-30 LAB — TROPONIN I: Troponin I: <0.3 ng/mL (ref <0.30)

## 2013-01-30 MED ORDER — PIPERACILLIN-TAZOBACTAM IN DEX 2-0.25 GM/50ML IV SOLN
2.2500 g | Freq: Three times a day (TID) | INTRAVENOUS | Status: DC
  Administered 2013-01-30 – 2013-01-31 (×2): 2.25 g via INTRAVENOUS
  Filled 2013-01-30 (×4): qty 50

## 2013-01-30 MED ORDER — MIDAZOLAM HCL 2 MG/2ML IJ SOLN: 2.0000 mg | Freq: Once | INTRAMUSCULAR | Status: AC

## 2013-01-30 MED ORDER — MIDAZOLAM HCL 2 MG/2ML IJ SOLN
INTRAMUSCULAR | Status: AC
  Administered 2013-01-30: 16:00:00 2 mg
  Filled 2013-01-30: qty 2

## 2013-01-30 MED ORDER — FENTANYL CITRATE 0.05 MG/ML IJ SOLN
INTRAMUSCULAR | Status: AC
  Administered 2013-01-30: 100 ug
  Filled 2013-01-30: qty 2

## 2013-01-30 MED ORDER — FENTANYL CITRATE 0.05 MG/ML IJ SOLN: 100.0000 ug | Freq: Once | INTRAMUSCULAR | Status: AC

## 2013-01-30 MED ORDER — VANCOMYCIN HCL IN DEXTROSE 750-5 MG/150ML-% IV SOLN
750.0000 mg | INTRAVENOUS | Status: DC
  Administered 2013-01-30: 750 mg via INTRAVENOUS
  Filled 2013-01-30: qty 150

## 2013-01-30 NOTE — Progress Notes (Signed)
[facility] PROGRESS NOTE      [name] WJX:[id number] DOB: Dec 10, 1934 DOA: 01/29/2013 PCP: [name], MD  HPI: [name] is a 78 y.o. male with PMH significant for non ischemic cardiomyopathy, systolic heart failure EF 15 % by ECHO 03-2012, CKD stage III, Cr baseline 2.4, chronic hydronephrosis, Non Hodgkin lymphoma, who presents to the ED complaining of progressive SOB on exertion for last week. He relates dyspnea on exertion, he has to stop exercising in the treadmill due to dyspnea. He usually exercise for 40 minutes daily. He denies chest pain, nausea, vomiting. He is only taking 10 mg of lasix. He is suppose to be taking 40 mg. He follow up with Dr Web this year. He was suppose to see him again in October but missed the appointment. He has not follow up with urology.   Assessment/Plan: Dyspnea; secondary to left side pleural effusion, acute on chronic systolic heart failure exacerbation. Patient with dyspnea, increase BNP at 11562 - Euvolemic on exam, Sharma Covert for diuresis Left pleural effusion - pulmonology consulted they evaluated for paracentesis. This was done later this afternoon and was positive for thick purulent foul-smelling material and the chest tube was placed, and empiric antibiotics were started. CKD stage III: Cr baseline 2.4. He has prior history of bilateral hydronephrosis. He never follow up with urology. Will repeat renal US, depending on result might need urology evaluation.  Anemia: probably anemia of chronic diseases. Hb at 9.9. Check anemia panel.   Diet: heart Fluids: none DVT Prophylaxis: heparin  Code Status: Full Family Communication: none  Disposition Plan: inpatient  Consultants:  Pulmonology   Cardiology  Procedures:  none   Antibiotics Vancomycin 11/12 >> Zosyn 11/12 >>  HPI/Subjective: Feeling "okay", hungry, awaiting thoracentesis this morning.  Objective: Filed Vitals:   01/29/13 1629 01/29/13 2125  01/30/13 0119 01/30/13 0444  BP: 96/56 104/56 89/47 93/65   Pulse: 113 98 101 95  Temp: 98.1 F (36.7 C) 98.8 F (37.1 C) 98 F (36.7 C) 99.4 F (37.4 C)  TempSrc: Oral Oral Oral Oral  Resp: 18 16 16 18   Height: 6" (0.152 m)     Weight: 57.561 kg (126 lb 14.4 oz)   56.79 kg (125 lb 3.2 oz)  SpO2: 92% 98% 100% 97%    Intake/Output Summary (Last 24 hours) at 01/30/13 1338 Last data filed at 01/30/13 1100  Gross per 24 hour  Intake    243 ml  Output    825 ml  Net   -582 ml   Filed Weights   01/29/13 1629 01/30/13 0444  Weight: 57.561 kg (126 lb 14.4 oz) 56.79 kg (125 lb 3.2 oz)    Exam:   General:  NAD  Cardiovascular: regular rate and rhythm, without MRG  Respiratory: Decreased air movement on the left, no wheezing or crackles  Abdomen: soft, not tender to palpation, positive bowel sounds  MSK: no peripheral edema Neuro: Grossly nonfocal  Recent Labs Lab 01/29/13 1105 01/30/13 0200  NA 140 137  K 4.3 4.9  CL 108 106  CO2  --  17*  GLUCOSE 101* 200*  BUN 62* 67*  CREATININE 2.40* 2.33*  CALCIUM  --  8.4   CBC:  Recent Labs Lab 01/29/13 1050 01/29/13 1105 01/30/13 0200  WBC 7.1  --  6.9  NEUTROABS 6.0  --   --   HGB 9.0* 9.9* 8.8*  HCT 26.5* 29.0* 26.1*  MCV 83.6  --  83.1  PLT 290  --  288  Cardiac Enzymes:  Recent Labs Lab 01/29/13 1910 01/30/13 0200  TROPONINI <0.30 <0.30   BNP (last 3 results)  Recent Labs  04/12/12 1224 01/29/13 1050  PROBNP 18809.0* 11562.0*   CBG: No results found for this basename: GLUCAP,  in the last 168 hours  No results found for this or any previous visit (from the past 240 hour(s)).   Studies: Dg Chest 2 View  01/29/2013   CLINICAL DATA:  Shortness of breath, productive cough, weakness, and weight loss. There is history of coronary artery disease, CHF, and chronic renal failure.  EXAM: CHEST  2 VIEW  COMPARISON:  April 15, 2012.  FINDINGS: There is volume loss on the left likely secondary to a  pleural effusion. On the right the lung is well-expanded and clear. The cardiac silhouette is top-normal in size. The pulmonary vascularity is not engorged. There is tortuosity of the descending thoracic aorta. There is curvature of the thoracic spine with the convexity towards the right.  IMPRESSION: There has been interval increase in the size of the left pleural effusion. No significant effusion on the right is demonstrated today.   Electronically Signed   By: David  [place]   On: 01/29/2013 11:21   Ct Chest Wo Contrast  01/29/2013   CLINICAL DATA:  Increasing dyspnea.  Productive cough.  EXAM: CT CHEST WITHOUT CONTRAST  TECHNIQUE: Multidetector CT imaging of the chest was performed following the standard protocol without IV contrast.  COMPARISON:  Chest radiographs obtained earlier today and chest CT dated 03/27/2006.  FINDINGS: Interval moderate to large-sized loculated pleural effusion in the left mid and lower lung zones. There is also a suggestion of a faintly visible pleural based mass inferiorly and medially, measuring 2.5 x 1.9 cm on image number 59. There is also a probable 2nd pleural based mass medially and inferiorly, measuring 2.8 x 1.5 cm on image number 64. Visualization of these masses is somewhat limited due to the lack of intravenous contrast. There is compressive atelectasis and some parenchymal calcification medial to the pleural fluid as well as deviation of the heart to the right. There is also patchy airspace opacity in the adjacent superior segment of the left lower lobe.  Dense proximal coronary artery calcifications are noted. Also noted is mild right lower lobe atelectasis. No enlarged lymph nodes are seen. Again demonstrated are bilateral renal cysts. These include extensive bilateral parapelvic cysts and possible chronic hydronephrosis. There are also small gallstones in the gallbladder, the largest measuring 3 mm in maximum diameter. Degenerative changes in the thoracic and  cervical spine with changes of DISH. Mild to moderate dextro convex scoliosis.  IMPRESSION: 1. Moderate to large-sized loculated left pleural fluid with possible pleural-based masses. These findings are concerning for a loculated malignant pleural effusion. Further evaluation with thoracentesis is recommended. The loculated pleural fluid is causing deviation of the heart to the right. 2. Probable pneumonia in the superior segment of the left lower lobe. 3. Bilateral lower lobe atelectasis, greater on the left. 4. Dense proximal coronary artery calcifications. 5. Cholelithiasis. 6. Chronic bilateral renal parapelvic cysts and possible UPJ obstructions.   Electronically Signed   By: Gordan Payment M.D.   On: 01/29/2013 23:08   Dg Chest Left Decubitus  01/29/2013   CLINICAL DATA:  Evaluate for layering effusion  EXAM: CHEST - LEFT DECUBITUS  COMPARISON:  Prior chest x-ray 01/29/2013  FINDINGS: No evidence of layering pleural of fluid. Otherwise, no significant interval change in the appearance of the chest compared to  earlier today.  IMPRESSION: No layering fluid. The left basilar opacity may reflect a loculated pleural effusion, dense consolidation, or potentially an underlying mass. Recommend further evaluation with contrasted CT scan of the chest.   Electronically Signed   By: Malachy Moan M.D.   On: 01/29/2013 15:47   US Renal  01/29/2013   CLINICAL DATA:  Followup hydronephrosis  EXAM: RENAL/URINARY TRACT ULTRASOUND COMPLETE  COMPARISON:  04/16/2012  FINDINGS: Right Kidney  Length: 10.4 cm. Echogenicity is within normal limits. There is been significant improvement in right hydronephrosis with residual wall pelvocaliectasis. Cyst within the inferior pole measures 10 x 7 x 8 mm.  Left Kidney  Length: Measures 10.7 cm. Echogenicity is within normal limits. There is moderate left hydronephrosis. This is improved when compared with the previous exam. Cyst within the upper pole measures 1.4 x 1.7 x 1.5 cm.   Bladder  Appears normal for degree of bladder distention.  Other:   The prostate gland is enlarged measuring 5 x 4.8 x 5.8 cm  IMPRESSION: 1. Interval improvement in severe bilateral hydronephrosis. There is residual right pelvocaliectasis and moderate left hydronephrosis. 2. Bilateral renal cysts. 3. Prostate gland enlargement.   Electronically Signed   By: Signa Kell M.D.   On: 01/29/2013 17:06    Scheduled Meds: . heparin  5,000 Units Subcutaneous Q8H  . sodium chloride  3 mL Intravenous Q12H  . sodium chloride  3 mL Intravenous Q12H   Continuous Infusions:   Principal Problem:   Acute systolic CHF (congestive heart failure) Active Problems:   Aortic insufficiency   CKD (chronic kidney disease), stage III   Bilateral hydronephrosis   Cardiomyopathy, ischemic   CAD (coronary artery disease)   Pleural effusion, left  Time spent: 25  Pamella Pert, MD [facility] Pager [phone]. If 7 PM - 7 AM, please contact night-coverage at [email or web address], password [facility] 01/30/2013, 1:38 PM  LOS: 1 day

## 2013-01-30 NOTE — Progress Notes (Signed)
Chart review complete.  Patient is not eligible for [facility] Care Management services because his/her PCP is not a [facility] primary care provider or is not [facility] affiliated. Patient has been referred to [facility] for telephonic care management support.  For any additional questions or new referrals please contact Anibal Verbeke BSN RN [facility] Liaison at [phone]

## 2013-01-30 NOTE — Procedures (Signed)
Name:  [name] MRN:  [id number] DOB:  12-31-1934  PROCEDURE NOTE  Procedure:  Ultrasound-guided thoracentesis.  Indications:  Pleural effusion.  Consent:  Procedure, benefits, risks and alternatives discussed.  Questions answered.  Consent obtained.  Anesthesia:  A total of 10 mL of 1% Lidocaine was used for local infiltration anesthesia.  Procedure summary:  Appropriate equipment was assembled.  The patient was identified as [name] and safety timeout was performed. The patient was placed sitting on the edge of the bed.  Sterile technique was used. The patient's left posterior chest was prepped using chlorhexidine / alcohol scrub and the field was draped in usual sterile fashion. The diaphragm, free-floating pleural fluid and the lung were identified by ultrasound. Images were documented and puncture site was marked. After the adequate anesthesia was achieved, the left pleural space was cannulated with the catheter-over-needle assembly without difficulty. The catheter was advanced into the pleural space and the needle was withdrawn. A total of 1000 mL of purulent thick foul smelling fluid was aspirated. Catheter was withdrawn. Sterile dressing was applied. Post-procedure chest x-ray was ordered.  Specimens sent:  Pleural fluid for pH, cell count, chemistry and cytology.  Complications:  No immediate complications were noted.  Hemodynamic parameters and oxygenation remained stable throughout the procedure.  Estimated blood loss:  Less then 5 mL.  Orlean Bradford, M.D. Pulmonary and Critical Care Medicine [facility] Pager: [phone]  01/30/2013, 9:48 PM

## 2013-01-30 NOTE — Progress Notes (Signed)
ANTIBIOTIC CONSULT NOTE - INITIAL  Pharmacy Consult for Vancomycin and Zosyn Indication: Empiric antibiotics following thoracentesis  Allergies  Allergen Reactions  . Contrast Media [Iodinated Diagnostic Agents] Other (See Comments)    Patient does not want dye because it is hard on his kidneys  . Prednisone Rash  . Shellfish Allergy Itching and Rash    Crab meat    Patient Measurements: Height: 6" (15.2 cm) Weight: 125 lb 3.2 oz (56.79 kg) (scale b) IBW/kg (Calculated) : -74.2 Adjusted Body Weight:    Vital Signs: Temp: 98.1 F (36.7 C) (11/12 1500) Temp src: Oral (11/12 1500) BP: 98/72 mmHg (11/12 1500) Pulse Rate: 94 (11/12 1500) Intake/Output from previous day: 11/11 0701 - 11/12 0700 In: 243 [P.O.:240; I.V.:3] Out: 450 [Urine:450] Intake/Output from this shift: Total I/O In: 0  Out: 676 [Urine:675; Stool:1]  Labs:  Recent Labs  01/29/13 1050 01/29/13 1105 01/30/13 0200  WBC 7.1  --  6.9  HGB 9.0* 9.9* 8.8*  PLT 290  --  288  CREATININE  --  2.40* 2.33*   Estimated Creatinine Clearance: -8.3 ml/min (by C-G formula based on Cr of 2.33). No results found for this basename: VANCOTROUGH, VANCOPEAK, VANCORANDOM, GENTTROUGH, GENTPEAK, GENTRANDOM, TOBRATROUGH, TOBRAPEAK, TOBRARND, AMIKACINPEAK, AMIKACINTROU, AMIKACIN,  in the last 72 hours   Microbiology: No results found for this or any previous visit (from the past 720 hour(s)).  Medical History: Past Medical History  Diagnosis Date  . Aortic insufficiency     a. Previously mod-severe;  b. 03/2012 Echo: Mild to Mod AI  . Atrial flutter     resolved  . Anemia   . PVCs (premature ventricular contractions)   . Chronic systolic CHF (congestive heart failure)     a.  03/2012 Echo: EF 15%, Mild to Mod AI, Mod MR  . Bilateral hydronephrosis     a. 03/2012 Renal u/s: Bilat Severe hydronephrosis, medical renal dzs.  . CKD (chronic kidney disease), stage III   . Non Hodgkin's lymphoma     resolved >10 yr ago  .  Moderate mitral regurgitation     a.  03/2012 Echo: Mod MR  . BPH (benign prostatic hyperplasia)     a. noted on renal u/s 03/2012.  . Ischemic cardiomyopathy     a.  Lexiscan Myoview 04/24/12: Inferior apical MI, study not gated due to frequent PVCs, triplets => patient refuses cath  . Gout     Medications:  Scheduled:  . heparin  5,000 Units Subcutaneous Q8H  . piperacillin-tazobactam (ZOSYN)  IV  2.25 g Intravenous Q8H  . sodium chloride  3 mL Intravenous Q12H  . sodium chloride  3 mL Intravenous Q12H  . vancomycin  750 mg Intravenous Q48H   Assessment: 77 yr old male admitted with 1 week history of worsening SOB and productive cough. CT showed large pleural effusion. He underwent a thoracentesis and has been started on empiric antibiotics. Will watch for culture results.   Goal of Therapy:  Vanc trough 15-20  Plan:  Zosyn 2.25 Gm IV q8h Vancomycin 750 mg q48h Follow up culture results. Adjust for renal function as needed.   Eugene Garnet 01/30/2013,5:12 PM

## 2013-01-30 NOTE — Progress Notes (Signed)
Thoracentesis revealed thick purulent foul-smelling material.  Chest tube placed.  Vancomycin / Zosyn ordered.

## 2013-01-30 NOTE — Progress Notes (Signed)
Thoracentesis performed and purulent drainage noted, pleural fluid taken to lab per MD orders, Chest tube placed to left side by MD, chest tube to suction, draining purulent drainage. Patient asleep, resting on left side, NAD noted, transporting patient to 2 [place].

## 2013-01-30 NOTE — Progress Notes (Signed)
01/29/13 7p-7a shift. Pt.A/Ox4 and is ambulatory with cane and standby assist. He had no c/o pain. During the shift patient's heart was 90's to low 100's ST however with activity his HR increased to 130's-140's ST non-sustained. Pt.was asymptomatic. Mid level on call with [facility] was paged and notified. No new orders written. Told to continue to monitor and if pt.becomes symptomatic and/or begins to sustain in the 130's or more to page again. Was notified by by monitor tech around 0400 that patient had 9 beat run of SVT. VS documented. Pt.asymptomatic. 9 beat SVT looked questionable though. Mid level provider with [facility] was paged and notified.

## 2013-01-30 NOTE — Procedures (Signed)
Chest Tube Insertion Procedure Note  Indications:  Clinically significant Empyema  Pre-operative Diagnosis: Empyema  Post-operative Diagnosis: Empyema  Procedure Details  Informed consent was obtained for the procedure, including sedation.  Risks of lung perforation, hemorrhage, arrhythmia, and adverse drug reaction were discussed.   After sterile skin prep, using standard technique, a 32 French tube was placed in the left lateral 7th  rib space.  Findings: 800 ml of purulent fluid obtained  Estimated Blood Loss:  Minimal         Specimens:  Sent purulent fluid              Complications:  None; patient tolerated the procedure well.         Disposition: tele w/ q vs x 1 hour          Condition: stable  Attending Attestation: I was present and scrubbed for the entire procedure.  Lonia Farber, MD Pulmonary and Critical Care Medicine [facility] Pager: [phone]

## 2013-01-30 NOTE — Progress Notes (Signed)
TELEMETRY: Reviewed telemetry pt in NSR, one 6 beat run of NSVT: Filed Vitals:   01/29/13 1629 01/29/13 2125 01/30/13 0119 01/30/13 0444  BP: 96/56 104/56 89/47 93/65   Pulse: 113 98 101 95  Temp: 98.1 F (36.7 C) 98.8 F (37.1 C) 98 F (36.7 C) 99.4 F (37.4 C)  TempSrc: Oral Oral Oral Oral  Resp: 18 16 16 18   Height: 6" (0.152 m)     Weight: 126 lb 14.4 oz (57.561 kg)   125 lb 3.2 oz (56.79 kg)  SpO2: 92% 98% 100% 97%    Intake/Output Summary (Last 24 hours) at 01/30/13 0947 Last data filed at 01/30/13 0443  Gross per 24 hour  Intake    243 ml  Output    450 ml  Net   -207 ml    SUBJECTIVE Breathing is better. Some cough. No chest pain or orthopnea. Denies any palpitations.  LABS: Basic Metabolic Panel:  Recent Labs  16/10/96 1105 01/30/13 0200  NA 140 137  K 4.3 4.9  CL 108 106  CO2  --  17*  GLUCOSE 101* 200*  BUN 62* 67*  CREATININE 2.40* 2.33*  CALCIUM  --  8.4   CBC:  Recent Labs  01/29/13 1050 01/29/13 1105 01/30/13 0200  WBC 7.1  --  6.9  NEUTROABS 6.0  --   --   HGB 9.0* 9.9* 8.8*  HCT 26.5* 29.0* 26.1*  MCV 83.6  --  83.1  PLT 290  --  288   Cardiac Enzymes:  Recent Labs  01/29/13 1910 01/30/13 0200  TROPONINI <0.30 <0.30   BNP: 11562  Anemia Panel:  Recent Labs  01/30/13 0200  RETICCTPCT 1.5    Radiology/Studies:  Dg Chest 2 View  01/29/2013   CLINICAL DATA:  Shortness of breath, productive cough, weakness, and weight loss. There is history of coronary artery disease, CHF, and chronic renal failure.  EXAM: CHEST  2 VIEW  COMPARISON:  April 15, 2012.  FINDINGS: There is volume loss on the left likely secondary to a pleural effusion. On the right the lung is well-expanded and clear. The cardiac silhouette is top-normal in size. The pulmonary vascularity is not engorged. There is tortuosity of the descending thoracic aorta. There is curvature of the thoracic spine with the convexity towards the right.  IMPRESSION: There has  been interval increase in the size of the left pleural effusion. No significant effusion on the right is demonstrated today.   Electronically Signed   By: David  [place]   On: 01/29/2013 11:21   Ct Chest Wo Contrast  01/29/2013   CLINICAL DATA:  Increasing dyspnea.  Productive cough.  EXAM: CT CHEST WITHOUT CONTRAST  TECHNIQUE: Multidetector CT imaging of the chest was performed following the standard protocol without IV contrast.  COMPARISON:  Chest radiographs obtained earlier today and chest CT dated 03/27/2006.  FINDINGS: Interval moderate to large-sized loculated pleural effusion in the left mid and lower lung zones. There is also a suggestion of a faintly visible pleural based mass inferiorly and medially, measuring 2.5 x 1.9 cm on image number 59. There is also a probable 2nd pleural based mass medially and inferiorly, measuring 2.8 x 1.5 cm on image number 64. Visualization of these masses is somewhat limited due to the lack of intravenous contrast. There is compressive atelectasis and some parenchymal calcification medial to the pleural fluid as well as deviation of the heart to the right. There is also patchy airspace opacity in the adjacent superior  segment of the left lower lobe.  Dense proximal coronary artery calcifications are noted. Also noted is mild right lower lobe atelectasis. No enlarged lymph nodes are seen. Again demonstrated are bilateral renal cysts. These include extensive bilateral parapelvic cysts and possible chronic hydronephrosis. There are also small gallstones in the gallbladder, the largest measuring 3 mm in maximum diameter. Degenerative changes in the thoracic and cervical spine with changes of DISH. Mild to moderate dextro convex scoliosis.  IMPRESSION: 1. Moderate to large-sized loculated left pleural fluid with possible pleural-based masses. These findings are concerning for a loculated malignant pleural effusion. Further evaluation with thoracentesis is recommended. The  loculated pleural fluid is causing deviation of the heart to the right. 2. Probable pneumonia in the superior segment of the left lower lobe. 3. Bilateral lower lobe atelectasis, greater on the left. 4. Dense proximal coronary artery calcifications. 5. Cholelithiasis. 6. Chronic bilateral renal parapelvic cysts and possible UPJ obstructions.   Electronically Signed   By: Gordan Payment M.D.   On: 01/29/2013 23:08   Dg Chest Left Decubitus  01/29/2013   CLINICAL DATA:  Evaluate for layering effusion  EXAM: CHEST - LEFT DECUBITUS  COMPARISON:  Prior chest x-ray 01/29/2013  FINDINGS: No evidence of layering pleural of fluid. Otherwise, no significant interval change in the appearance of the chest compared to earlier today.  IMPRESSION: No layering fluid. The left basilar opacity may reflect a loculated pleural effusion, dense consolidation, or potentially an underlying mass. Recommend further evaluation with contrasted CT scan of the chest.   Electronically Signed   By: Malachy Moan M.D.   On: 01/29/2013 15:47   US Renal  01/29/2013   CLINICAL DATA:  Followup hydronephrosis  EXAM: RENAL/URINARY TRACT ULTRASOUND COMPLETE  COMPARISON:  04/16/2012  FINDINGS: Right Kidney  Length: 10.4 cm. Echogenicity is within normal limits. There is been significant improvement in right hydronephrosis with residual wall pelvocaliectasis. Cyst within the inferior pole measures 10 x 7 x 8 mm.  Left Kidney  Length: Measures 10.7 cm. Echogenicity is within normal limits. There is moderate left hydronephrosis. This is improved when compared with the previous exam. Cyst within the upper pole measures 1.4 x 1.7 x 1.5 cm.  Bladder  Appears normal for degree of bladder distention.  Other:   The prostate gland is enlarged measuring 5 x 4.8 x 5.8 cm  IMPRESSION: 1. Interval improvement in severe bilateral hydronephrosis. There is residual right pelvocaliectasis and moderate left hydronephrosis. 2. Bilateral renal cysts. 3. Prostate gland  enlargement.   Electronically Signed   By: Signa Kell M.D.   On: 01/29/2013 17:06    PHYSICAL EXAM General: Well developed, thin, in no acute distress. Head: Normal Neck: Negative for carotid bruits. JVD not elevated. Lungs: Diminished BS left base. Heart: RRR S1 S2 without murmurs, rubs, or gallops.  Abdomen: Soft, non-tender, non-distended with normoactive bowel sounds. No hepatomegaly. No rebound/guarding. No obvious abdominal masses. Msk:  Strength and tone appears normal for age. Extremities: No clubbing, cyanosis or edema.  Distal pedal pulses are 2+ and equal bilaterally. Neuro: Alert and oriented X 3. Moves all extremities spontaneously. Psych:  Responds to questions appropriately with a normal affect.  ASSESSMENT AND PLAN: 1. Chronic systolic CHF- appears well compensated. Agree with holding diuretics now. Not a candidate for ACEi/ ARB/aldactone due to CKD. Patient has refused more aggressive Rx with beta blocker/nitrates/hydralazine in past. Does not want cardiac cath or consideration for ICD. 2. Left pleural effusion with pleural based mass. Awaiting thoracentesis.  3. CKD stage III 4. Moderate MR, mild-moderate AI 5. NSVT-asymptomatic 6. Remote history of non- Hodgkins lymphoma.   Principal Problem:   Acute systolic CHF (congestive heart failure) Active Problems:   Aortic insufficiency   CKD (chronic kidney disease), stage III   Bilateral hydronephrosis   Cardiomyopathy, ischemic   CAD (coronary artery disease)   Pleural effusion, left    Signed, Samirah Scarpati [place] MD,FACC 01/30/2013 9:54 AM

## 2013-01-30 NOTE — Consult Note (Signed)
PULMONARY  / CRITICAL CARE MEDICINE  Name: [name] MRN: [id number] DOB: [birth year]    ADMISSION DATE:  01/29/2013 CONSULTATION DATE:  01/30/13   REFERRING MD :  [facility] PRIMARY SERVICE:  [facility]  CHIEF COMPLAINT:  SOB  BRIEF PATIENT DESCRIPTION: 78 y.o. With PMH of CHF, CKD, ischemic cardiomyopathy, and h/o Non hodgkins lymphoma admitted by [facility] 11/11 for 1 week history of worsening SOB and productive cough.  CT scan of chest w/o contrast showed large left pleural effusion w/ possible loculation and two poorly visualized masses.    SIGNIFICANT EVENTS / STUDIES:  11/11 Admitted shortness of breath 11/11 CT chest w/o contrast >> Large left pleural effusion, possible loculations and right sided deviation of the heart, difficult to visualize masses likely pleural based 11/11 Thoracentesis >>>  CULTURES:  ANTIBIOTICS:  HISTORY OF PRESENT ILLNESS:  78 y.o. Male with PMH of CHF, CKD stage III, ischemic cardiomyopathy, and h/o Non-hodkins lymphoma treated with chemo approximately ten years ago presented to [facility] with 1 week history of worsening SOB, and productive cough with yellow sputum.  Patient states that he saw his PCP and was given a shot, but saw no improvement.  Patient was admitted per [facility].  CXR shows large left pleural effusion and CT scan of chest shows possible loculated effusion and two poorly defined masses which radiology thinks may be pleurally based.  PCCM asked to consult and determine need for thoracentesis.  Patient was born in [place].  He was in the [employer] for 25 years.  He denies any exposure to asbestos or Present[employer], broadcasting.  He has never smoked or consumed alcohol.  Patient admits to increasing SOB, DOE, and productive cough with yellow sputum.  He denies weight loss without intention, hemoptysis, anginal chest pain, pleuritic chest pain, fever, chills, nausea, vomiting, PND, orthopnea, leg edema and night sweats.   PAST MEDICAL HISTORY :  Past Medical History  Diagnosis Date  .  Aortic insufficiency     a. Previously mod-severe;  b. 03/2012 Echo: Mild to Mod AI  . Atrial flutter     resolved  . Anemia   . PVCs (premature ventricular contractions)   . Chronic systolic CHF (congestive heart failure)     a.  03/2012 Echo: EF 15%, Mild to Mod AI, Mod MR  . Bilateral hydronephrosis     a. 03/2012 Renal u/s: Bilat Severe hydronephrosis, medical renal dzs.  . CKD (chronic kidney disease), stage III   . Non Hodgkin's lymphoma     resolved >10 yr ago  . Moderate mitral regurgitation     a.  03/2012 Echo: Mod MR  . BPH (benign prostatic hyperplasia)     a. noted on renal u/s 03/2012.  . Ischemic cardiomyopathy     a.  Lexiscan Myoview 04/24/12: Inferior apical MI, study not gated due to frequent PVCs, triplets => patient refuses cath  . Gout    Past Surgical History  Procedure Laterality Date  . Lymph node dissection      Groin  . Inguinal hernia repair     Prior to Admission medications   Medication Sig Start Date End Date Taking? Authorizing Provider  furosemide (LASIX) 40 MG tablet Take 40 mg by mouth daily as needed for fluid.   Yes Historical Provider, MD  nitroGLYCERIN (NITROSTAT) 0.4 MG SL tablet Place 1 tablet (0.4 mg total) under the tongue every 5 (five) minutes x 3 doses as needed for chest pain. 06/22/12  Yes Peter M [place], MD  triamcinolone cream (  KENALOG) 0.1 % Apply 1 application topically daily as needed. For rash   Yes Historical Provider, MD   Allergies  Allergen Reactions  . Contrast Media [Iodinated Diagnostic Agents] Other (See Comments)    Patient does not want dye because it is hard on his kidneys  . Prednisone Rash  . Shellfish Allergy Itching and Rash    Crab meat   FAMILY HISTORY:  Family History  Problem Relation Age of Onset  . Prostate cancer Father    SOCIAL HISTORY:  reports that he has never smoked. He has never used smokeless tobacco. He reports that he does not drink alcohol or use illicit drugs.  REVIEW OF SYSTEMS:   See  HPI  SUBJECTIVE:   VITAL SIGNS: Temp:  [98 F (36.7 C)-99.4 F (37.4 C)] 99.4 F (37.4 C) (11/12 0444) Pulse Rate:  [79-113] 95 (11/12 0444) Resp:  [16-31] 18 (11/12 0444) BP: (80-108)/(47-69) 93/65 mmHg (11/12 0444) SpO2:  [92 %-100 %] 97 % (11/12 0444) Weight:  [125 lb 3.2 oz (56.79 kg)-126 lb 14.4 oz (57.561 kg)] 125 lb 3.2 oz (56.79 kg) (11/12 0444)  PHYSICAL EXAMINATION: General:  Thin cachetic appearing male, chronically ill appearing Neuro:  A&Ox3, appropriate conversational skills, moves extremities x 4, appropriate strength HEENT:  NCAT, MM dry and pink, PERL, sclera anicteric, mild JVD Cardiovascular:  RRR, soft murmur heard best over left lateral precordium Lungs:  Coarse wet crackles over left, diminished over left lung base Abdomen:  +BS, soft, non-tender Musculoskeletal:  No noted edema Skin:  Clean, dry, no clubbing   Recent Labs Lab 01/29/13 1105 01/30/13 0200  NA 140 137  K 4.3 4.9  CL 108 106  CO2  --  17*  BUN 62* 67*  CREATININE 2.40* 2.33*  GLUCOSE 101* 200*    Recent Labs Lab 01/29/13 1050 01/29/13 1105 01/30/13 0200  HGB 9.0* 9.9* 8.8*  HCT 26.5* 29.0* 26.1*  WBC 7.1  --  6.9  PLT 290  --  288   Dg Chest 2 View  01/29/2013   CLINICAL DATA:  Shortness of breath, productive cough, weakness, and weight loss. There is history of coronary artery disease, CHF, and chronic renal failure.  EXAM: CHEST  2 VIEW  COMPARISON:  April 15, 2012.  FINDINGS: There is volume loss on the left likely secondary to a pleural effusion. On the right the lung is well-expanded and clear. The cardiac silhouette is top-normal in size. The pulmonary vascularity is not engorged. There is tortuosity of the descending thoracic aorta. There is curvature of the thoracic spine with the convexity towards the right.  IMPRESSION: There has been interval increase in the size of the left pleural effusion. No significant effusion on the right is demonstrated today.   Electronically  Signed   By: David  [place]   On: 01/29/2013 11:21   Ct Chest Wo Contrast  01/29/2013   CLINICAL DATA:  Increasing dyspnea.  Productive cough.  EXAM: CT CHEST WITHOUT CONTRAST  TECHNIQUE: Multidetector CT imaging of the chest was performed following the standard protocol without IV contrast.  COMPARISON:  Chest radiographs obtained earlier today and chest CT dated 03/27/2006.  FINDINGS: Interval moderate to large-sized loculated pleural effusion in the left mid and lower lung zones. There is also a suggestion of a faintly visible pleural based mass inferiorly and medially, measuring 2.5 x 1.9 cm on image number 59. There is also a probable 2nd pleural based mass medially and inferiorly, measuring 2.8 x 1.5 cm on image  number 64. Visualization of these masses is somewhat limited due to the lack of intravenous contrast. There is compressive atelectasis and some parenchymal calcification medial to the pleural fluid as well as deviation of the heart to the right. There is also patchy airspace opacity in the adjacent superior segment of the left lower lobe.  Dense proximal coronary artery calcifications are noted. Also noted is mild right lower lobe atelectasis. No enlarged lymph nodes are seen. Again demonstrated are bilateral renal cysts. These include extensive bilateral parapelvic cysts and possible chronic hydronephrosis. There are also small gallstones in the gallbladder, the largest measuring 3 mm in maximum diameter. Degenerative changes in the thoracic and cervical spine with changes of DISH. Mild to moderate dextro convex scoliosis.  IMPRESSION: 1. Moderate to large-sized loculated left pleural fluid with possible pleural-based masses. These findings are concerning for a loculated malignant pleural effusion. Further evaluation with thoracentesis is recommended. The loculated pleural fluid is causing deviation of the heart to the right. 2. Probable pneumonia in the superior segment of the left lower lobe. 3.  Bilateral lower lobe atelectasis, greater on the left. 4. Dense proximal coronary artery calcifications. 5. Cholelithiasis. 6. Chronic bilateral renal parapelvic cysts and possible UPJ obstructions.   Electronically Signed   By: Gordan Payment M.D.   On: 01/29/2013 23:08   Dg Chest Left Decubitus  01/29/2013   CLINICAL DATA:  Evaluate for layering effusion  EXAM: CHEST - LEFT DECUBITUS  COMPARISON:  Prior chest x-ray 01/29/2013  FINDINGS: No evidence of layering pleural of fluid. Otherwise, no significant interval change in the appearance of the chest compared to earlier today.  IMPRESSION: No layering fluid. The left basilar opacity may reflect a loculated pleural effusion, dense consolidation, or potentially an underlying mass. Recommend further evaluation with contrasted CT scan of the chest.   Electronically Signed   By: Malachy Moan M.D.   On: 01/29/2013 15:47   US Renal  01/29/2013   CLINICAL DATA:  Followup hydronephrosis  EXAM: RENAL/URINARY TRACT ULTRASOUND COMPLETE  COMPARISON:  04/16/2012  FINDINGS: Right Kidney  Length: 10.4 cm. Echogenicity is within normal limits. There is been significant improvement in right hydronephrosis with residual wall pelvocaliectasis. Cyst within the inferior pole measures 10 x 7 x 8 mm.  Left Kidney  Length: Measures 10.7 cm. Echogenicity is within normal limits. There is moderate left hydronephrosis. This is improved when compared with the previous exam. Cyst within the upper pole measures 1.4 x 1.7 x 1.5 cm.  Bladder  Appears normal for degree of bladder distention.  Other:   The prostate gland is enlarged measuring 5 x 4.8 x 5.8 cm  IMPRESSION: 1. Interval improvement in severe bilateral hydronephrosis. There is residual right pelvocaliectasis and moderate left hydronephrosis. 2. Bilateral renal cysts. 3. Prostate gland enlargement.   Electronically Signed   By: Signa Kell M.D.   On: 01/29/2013 17:06   ASSESSMENT / PLAN:  Dyspnea Left pleural  effusion Suspected pleural-based masses Suspected LLL pneumonia (low probability)  -->  Diagnostic / therapeutic thoracentesis today -->  May need to be re-imaged after fluid is drained -->  PCT -->  Urine strep antigen -->  Sputum culture if able to produce -->  Doubt it will respond to diuresis -->  Agree with deferring abx at this time -->  Will follow  Carley Hammed Physician Assistant Student 01/30/13, 12:11 PM  I have personally obtained history, examined patient, evaluated and interpreted laboratory and imaging results, reviewed medical records, formulated assessment /  plan and placed orders.  Lonia Farber, MD Pulmonary and Critical Care Medicine [facility] Pager: [phone]  01/30/2013, 3:06 PM

## 2013-01-31 DIAGNOSIS — J869 Pyothorax without fistula: Secondary | ICD-10-CM

## 2013-01-31 DIAGNOSIS — I4891 Unspecified atrial fibrillation: Secondary | ICD-10-CM

## 2013-01-31 DIAGNOSIS — J9819 Other pulmonary collapse: Secondary | ICD-10-CM | POA: Diagnosis present

## 2013-01-31 DIAGNOSIS — J9600 Acute respiratory failure, unspecified whether with hypoxia or hypercapnia: Secondary | ICD-10-CM | POA: Diagnosis present

## 2013-01-31 DIAGNOSIS — E43 Unspecified severe protein-calorie malnutrition: Secondary | ICD-10-CM | POA: Diagnosis present

## 2013-01-31 DIAGNOSIS — I480 Paroxysmal atrial fibrillation: Secondary | ICD-10-CM | POA: Diagnosis not present

## 2013-01-31 HISTORY — DX: Unspecified severe protein-calorie malnutrition: E43

## 2013-01-31 HISTORY — DX: Acute respiratory failure, unspecified whether with hypoxia or hypercapnia: J96.00

## 2013-01-31 LAB — BASIC METABOLIC PANEL
BUN: 60 mg/dL — ABNORMAL HIGH (ref 6–23)
BUN: 57 mg/dL — ABNORMAL HIGH (ref 6–23)
CO2: 19 mEq/L (ref 19–32)
Calcium: 8.8 mg/dL (ref 8.4–10.5)
Chloride: 108 mEq/L (ref 96–112)
Chloride: 107 mEq/L (ref 96–112)
Creatinine, Ser: 1.96 mg/dL — ABNORMAL HIGH (ref 0.50–1.35)
GFR calc Af Amer: 33 mL/min — ABNORMAL LOW (ref >90)
Glucose, Bld: 98 mg/dL (ref 70–99)
Potassium: 6.2 mEq/L — ABNORMAL HIGH (ref 3.5–5.1)

## 2013-01-31 LAB — PROTIME-INR
INR: 1.41 (ref 0.00–1.49)
Prothrombin Time: 16.9 seconds — ABNORMAL HIGH (ref 11.6–15.2)

## 2013-01-31 LAB — CBC
HCT: 31.6 % — ABNORMAL LOW (ref 39.0–52.0)
HCT: 29.3 % — ABNORMAL LOW (ref 39.0–52.0)
Hemoglobin: 10.2 g/dL — ABNORMAL LOW (ref 13.0–17.0)
Hemoglobin: 9.4 g/dL — ABNORMAL LOW (ref 13.0–17.0)
MCH: 27.6 pg (ref 26.0–34.0)
MCH: 27.4 pg (ref 26.0–34.0)
MCHC: 32.3 g/dL (ref 30.0–36.0)
MCHC: 32.1 g/dL (ref 30.0–36.0)
MCV: 85.4 fL (ref 78.0–100.0)
MCV: 85.4 fL (ref 78.0–100.0)
Platelets: 267 10*3/uL (ref 150–400)
RBC: 3.43 MIL/uL — ABNORMAL LOW (ref 4.22–5.81)
RDW: 15.8 % — ABNORMAL HIGH (ref 11.5–15.5)
RDW: 16 % — ABNORMAL HIGH (ref 11.5–15.5)
WBC: 7.9 10*3/uL (ref 4.0–10.5)
WBC: 6.5 10*3/uL (ref 4.0–10.5)

## 2013-01-31 LAB — BLOOD GAS, ARTERIAL
Acid-base deficit: 3.1 mmol/L — ABNORMAL HIGH (ref 0.0–2.0)
Bicarbonate: 21.3 mEq/L (ref 20.0–24.0)
Drawn by: 235321
FIO2: 0.21 %
O2 Saturation: 93.2 %
Patient temperature: 98.6
TCO2: 22.5 mmol/L (ref 0–100)
pCO2 arterial: 37.5 mmHg (ref 35.0–45.0)
pH, Arterial: 7.373 (ref 7.350–7.450)
pO2, Arterial: 65.5 mmHg — ABNORMAL LOW (ref 80.0–100.0)

## 2013-01-31 LAB — URINALYSIS, ROUTINE W REFLEX MICROSCOPIC
Bilirubin Urine: NEGATIVE
Glucose, UA: NEGATIVE mg/dL
Hgb urine dipstick: NEGATIVE
Ketones, ur: NEGATIVE mg/dL
Leukocytes, UA: NEGATIVE
Nitrite: NEGATIVE
Protein, ur: NEGATIVE mg/dL
Specific Gravity, Urine: 1.018 (ref 1.005–1.030)
Urobilinogen, UA: 0.2 mg/dL (ref 0.0–1.0)
pH: 5 (ref 5.0–8.0)

## 2013-01-31 LAB — COMPREHENSIVE METABOLIC PANEL
ALT: 17 U/L (ref 0–53)
AST: 30 U/L (ref 0–37)
Albumin: 1.7 g/dL — ABNORMAL LOW (ref 3.5–5.2)
Alkaline Phosphatase: 70 U/L (ref 39–117)
BUN: 54 mg/dL — ABNORMAL HIGH (ref 6–23)
CO2: 21 mEq/L (ref 19–32)
Calcium: 8.3 mg/dL — ABNORMAL LOW (ref 8.4–10.5)
Chloride: 105 mEq/L (ref 96–112)
Creatinine, Ser: 2.14 mg/dL — ABNORMAL HIGH (ref 0.50–1.35)
GFR calc Af Amer: 33 mL/min — ABNORMAL LOW (ref >90)
GFR calc non Af Amer: 28 mL/min — ABNORMAL LOW (ref >90)
Glucose, Bld: 127 mg/dL — ABNORMAL HIGH (ref 70–99)
Potassium: 5.2 mEq/L — ABNORMAL HIGH (ref 3.5–5.1)
Sodium: 135 mEq/L (ref 135–145)
Total Bilirubin: 0.2 mg/dL — ABNORMAL LOW (ref 0.3–1.2)
Total Protein: 6.7 g/dL (ref 6.0–8.3)

## 2013-01-31 LAB — TYPE AND SCREEN
ABO/RH(D): A POS
Antibody Screen: NEGATIVE

## 2013-01-31 LAB — PH, BODY FLUID: pH, Fluid: 8

## 2013-01-31 LAB — SURGICAL PCR SCREEN
MRSA, PCR: NEGATIVE
Staphylococcus aureus: NEGATIVE

## 2013-01-31 LAB — APTT: aPTT: 34 seconds (ref 24–37)

## 2013-01-31 LAB — ABO/RH: ABO/RH(D): A POS

## 2013-01-31 MED ORDER — DEXTROSE 5 % IV SOLN
1.5000 g | INTRAVENOUS | Status: DC
  Filled 2013-01-31: qty 1.5

## 2013-01-31 MED ORDER — CEFTRIAXONE SODIUM 2 G IJ SOLR
2.0000 g | INTRAMUSCULAR | Status: DC
  Administered 2013-01-31 – 2013-02-03 (×4): 2 g via INTRAVENOUS
  Filled 2013-01-31 (×5): qty 2

## 2013-01-31 MED ORDER — PIPERACILLIN-TAZOBACTAM 3.375 G IVPB
3.3750 g | Freq: Three times a day (TID) | INTRAVENOUS | Status: DC
  Administered 2013-01-31: 3.375 g via INTRAVENOUS
  Filled 2013-01-31 (×3): qty 50

## 2013-01-31 MED ORDER — SODIUM POLYSTYRENE SULFONATE 15 GM/60ML PO SUSP
15.0000 g | Freq: Once | ORAL | Status: AC
  Administered 2013-01-31: 15 g via ORAL
  Filled 2013-01-31: qty 60

## 2013-01-31 MED ORDER — TAMSULOSIN HCL 0.4 MG PO CAPS
0.4000 mg | ORAL_CAPSULE | Freq: Every day | ORAL | Status: DC
  Administered 2013-01-31 – 2013-02-07 (×8): 0.4 mg via ORAL
  Filled 2013-01-31 (×9): qty 1

## 2013-01-31 MED ORDER — VANCOMYCIN HCL 500 MG IV SOLR
500.0000 mg | INTRAVENOUS | Status: DC
  Administered 2013-01-31 – 2013-02-03 (×4): 500 mg via INTRAVENOUS
  Filled 2013-01-31 (×5): qty 500

## 2013-01-31 NOTE — Clinical Documentation Improvement (Signed)
Possible Clinical Conditions?  _______Hypokalemia   _______Other Condition  _______Cannot Clinically Determine    Diagnostics: 11/13: potassium:  6.2   Thank You, Rodman Pickle ,RN Clinical Documentation Specialist:  [phone]  [facility]- Health Information Management

## 2013-01-31 NOTE — Progress Notes (Signed)
TELEMETRY: Reviewed telemetry pt in NSR, had episodes of afib/flutter with RVR last night.: Filed Vitals:   01/30/13 1715 01/30/13 1819 01/30/13 2024 01/31/13 0454  BP: 128/70 105/63 99/54 98/59   Pulse: 84 80 91 97  Temp: 98.8 F (37.1 C) 97.4 F (36.3 C) 98.1 F (36.7 C) 97.7 F (36.5 C)  TempSrc: Oral Oral Oral Oral  Resp: 22 18 18 18   Height:      Weight:    125 lb 3.5 oz (56.8 kg)  SpO2: 99% 99% 94% 96%    Intake/Output Summary (Last 24 hours) at 01/31/13 1610 Last data filed at 01/31/13 0600  Gross per 24 hour  Intake      0 ml  Output   1251 ml  Net  -1251 ml    SUBJECTIVE Breathing is much better. Some cough. No chest pain or orthopnea. Denies any palpitations.  LABS: Basic Metabolic Panel:  Recent Labs  96/04/54 1105 01/30/13 0200  NA 140 137  K 4.3 4.9  CL 108 106  CO2  --  17*  GLUCOSE 101* 200*  BUN 62* 67*  CREATININE 2.40* 2.33*  CALCIUM  --  8.4   CBC:  Recent Labs  01/29/13 1050  01/30/13 0200 01/31/13 0518  WBC 7.1  --  6.9 7.9  NEUTROABS 6.0  --   --   --   HGB 9.0*  < > 8.8* 10.2*  HCT 26.5*  < > 26.1* 31.6*  MCV 83.6  --  83.1 85.4  PLT 290  --  288 316  < > = values in this interval not displayed. Cardiac Enzymes:  Recent Labs  01/29/13 1910 01/30/13 0200  TROPONINI <0.30 <0.30   BNP: 11562  Anemia Panel:  Recent Labs  01/30/13 0200  VITAMINB12 730  FOLATE 13.4  FERRITIN 406*  TIBC 140*  IRON 15*  RETICCTPCT 1.5    Radiology/Studies:  Dg Chest 2 View  01/29/2013   CLINICAL DATA:  Shortness of breath, productive cough, weakness, and weight loss. There is history of coronary artery disease, CHF, and chronic renal failure.  EXAM: CHEST  2 VIEW  COMPARISON:  April 15, 2012.  FINDINGS: There is volume loss on the left likely secondary to a pleural effusion. On the right the lung is well-expanded and clear. The cardiac silhouette is top-normal in size. The pulmonary vascularity is not engorged. There is  tortuosity of the descending thoracic aorta. There is curvature of the thoracic spine with the convexity towards the right.  IMPRESSION: There has been interval increase in the size of the left pleural effusion. No significant effusion on the right is demonstrated today.   Electronically Signed   By: David  [place]   On: 01/29/2013 11:21   Ct Chest Wo Contrast  01/29/2013   CLINICAL DATA:  Increasing dyspnea.  Productive cough.  EXAM: CT CHEST WITHOUT CONTRAST  TECHNIQUE: Multidetector CT imaging of the chest was performed following the standard protocol without IV contrast.  COMPARISON:  Chest radiographs obtained earlier today and chest CT dated 03/27/2006.  FINDINGS: Interval moderate to large-sized loculated pleural effusion in the left mid and lower lung zones. There is also a suggestion of a faintly visible pleural based mass inferiorly and medially, measuring 2.5 x 1.9 cm on image number 59. There is also a probable 2nd pleural based mass medially and inferiorly, measuring 2.8 x 1.5 cm on image number 64. Visualization of these masses is somewhat limited due to the lack of intravenous contrast. There  is compressive atelectasis and some parenchymal calcification medial to the pleural fluid as well as deviation of the heart to the right. There is also patchy airspace opacity in the adjacent superior segment of the left lower lobe.  Dense proximal coronary artery calcifications are noted. Also noted is mild right lower lobe atelectasis. No enlarged lymph nodes are seen. Again demonstrated are bilateral renal cysts. These include extensive bilateral parapelvic cysts and possible chronic hydronephrosis. There are also small gallstones in the gallbladder, the largest measuring 3 mm in maximum diameter. Degenerative changes in the thoracic and cervical spine with changes of DISH. Mild to moderate dextro convex scoliosis.  IMPRESSION: 1. Moderate to large-sized loculated left pleural fluid with possible  pleural-based masses. These findings are concerning for a loculated malignant pleural effusion. Further evaluation with thoracentesis is recommended. The loculated pleural fluid is causing deviation of the heart to the right. 2. Probable pneumonia in the superior segment of the left lower lobe. 3. Bilateral lower lobe atelectasis, greater on the left. 4. Dense proximal coronary artery calcifications. 5. Cholelithiasis. 6. Chronic bilateral renal parapelvic cysts and possible UPJ obstructions.   Electronically Signed   By: Gordan Payment M.D.   On: 01/29/2013 23:08   Dg Chest Left Decubitus  01/29/2013   CLINICAL DATA:  Evaluate for layering effusion  EXAM: CHEST - LEFT DECUBITUS  COMPARISON:  Prior chest x-ray 01/29/2013  FINDINGS: No evidence of layering pleural of fluid. Otherwise, no significant interval change in the appearance of the chest compared to earlier today.  IMPRESSION: No layering fluid. The left basilar opacity may reflect a loculated pleural effusion, dense consolidation, or potentially an underlying mass. Recommend further evaluation with contrasted CT scan of the chest.   Electronically Signed   By: Malachy Moan M.D.   On: 01/29/2013 15:47   US Renal  01/29/2013   CLINICAL DATA:  Followup hydronephrosis  EXAM: RENAL/URINARY TRACT ULTRASOUND COMPLETE  COMPARISON:  04/16/2012  FINDINGS: Right Kidney  Length: 10.4 cm. Echogenicity is within normal limits. There is been significant improvement in right hydronephrosis with residual wall pelvocaliectasis. Cyst within the inferior pole measures 10 x 7 x 8 mm.  Left Kidney  Length: Measures 10.7 cm. Echogenicity is within normal limits. There is moderate left hydronephrosis. This is improved when compared with the previous exam. Cyst within the upper pole measures 1.4 x 1.7 x 1.5 cm.  Bladder  Appears normal for degree of bladder distention.  Other:   The prostate gland is enlarged measuring 5 x 4.8 x 5.8 cm  IMPRESSION: 1. Interval improvement  in severe bilateral hydronephrosis. There is residual right pelvocaliectasis and moderate left hydronephrosis. 2. Bilateral renal cysts. 3. Prostate gland enlargement.   Electronically Signed   By: Signa Kell M.D.   On: 01/29/2013 17:06    PHYSICAL EXAM General: Well developed, thin, in no acute distress. Head: Normal Neck: Negative for carotid bruits. JVD not elevated. Lungs: Diminished BS left base. Chest tube in place. Heart: RRR S1 S2 without murmurs, rubs, or gallops.  Abdomen: Soft, non-tender, non-distended with normoactive bowel sounds. No hepatomegaly.  Msk:  Strength and tone appears normal for age. Extremities: No clubbing, cyanosis or edema.  Distal pedal pulses are 2+ and equal bilaterally. Neuro: Alert and oriented X 3. Moves all extremities spontaneously. Psych:  Responds to questions appropriately with a normal affect.  ASSESSMENT AND PLAN: 1. Chronic systolic CHF- appears well compensated. Agree with holding diuretics now. Not a candidate for ACEi/ ARB/aldactone due to  CKD. Patient has refused more aggressive Rx with beta blocker/nitrates/hydralazine in past. Does not want cardiac cath or consideration for ICD.  2. Left pleural effusion. Thoracentesis consistent with empyema. Chest tube now in place. On IV antibiotics. 3. CKD stage III 4. Moderate MR, mild-moderate AI 5. NSVT-asymptomatic 6. Remote history of non- Hodgkins lymphoma. 7. Paroxysmal AFib/flutter. Likely related to instrumentation with chest tube. Will observe for now. BP soft. If recurrent arrhythmia will add a beta blocker.    Principal Problem:   Acute systolic CHF (congestive heart failure) Active Problems:   Aortic insufficiency   CKD (chronic kidney disease), stage III   Bilateral hydronephrosis   Cardiomyopathy, ischemic   CAD (coronary artery disease)   Pleural effusion, left   Pneumonia   Empyema    Signed, Temprance Wyre [place] MD,FACC 01/31/2013 6:42 AM

## 2013-01-31 NOTE — Progress Notes (Signed)
PULMONARY  / CRITICAL CARE MEDICINE  Name: [name] MRN: [id number] DOB: 1935-03-02    ADMISSION DATE:  01/29/2013 CONSULTATION DATE:  01/30/13   REFERRING MD :  [facility] PRIMARY SERVICE:  [facility]  CHIEF COMPLAINT:  SOB  BRIEF PATIENT DESCRIPTION: 78 y.o. With PMH of CHF, CKD, ischemic cardiomyopathy, and h/o Non hodgkins lymphoma admitted by [facility] 11/11 for 1 week history of worsening SOB and productive cough.  CT scan of chest w/o contrast showed large left pleural effusion w/ possible loculation and two poorly visualized masses.    SIGNIFICANT EVENTS / STUDIES:  11/11 Admitted shortness of breath 11/11 CT chest w/o contrast >> Large left pleural effusion, possible loculations and right sided deviation of the heart, difficult to visualize masses likely pleural based 11/12 Left Thoracentesis >>> 1L pus 11/12 CT placed   TUBES/LINES: 11/12 L CT>>>   CULTURES: 11/12 pleural fluid>>>abundant GPC clusters >>   ANTIBIOTICS: Zosyn 11/12>> 11/13 Vancomycin 11/12 >>  Ceftriaxone 11/13 >>   SUBJECTIVE:  Feeling better.  Denies pain or SOB.   VITAL SIGNS: Temp:  [97.4 F (36.3 C)-98.8 F (37.1 C)] 97.7 F (36.5 C) (11/13 0454) Pulse Rate:  [80-99] 97 (11/13 0454) Resp:  [18-22] 18 (11/13 0454) BP: (98-141)/(51-130) 98/59 mmHg (11/13 0454) SpO2:  [94 %-100 %] 96 % (11/13 0454) Weight:  [56.8 kg (125 lb 3.5 oz)] 56.8 kg (125 lb 3.5 oz) (11/13 0454)  PHYSICAL EXAMINATION: General:  Thin cachetic appearing male, chronically ill appearing Neuro:  A&Ox3, appropriate, moves extremities x 4, appropriate strength HEENT:  NCAT, MM dry and pink, PERL, sclera anicteric, mild JVD Cardiovascular:  RRR, soft murmur heard best over left lateral precordium Lungs:  resps even non labored on [place], coarse L>R, diminished L base, L chest tube to suction with serosanguinous drng, no airleak Abdomen:  +BS, soft, non-tender Musculoskeletal:  No noted edema Skin:  Clean, dry, no clubbing   I have  reviewed all of today's lab results. Relevant abnormalities are discussed in the A/P section   CXR: Chest tube in place - empyema drained. Trapped LLL    ASSESSMENT: L empyema - staph organism by GS Trapped LLL   REC -  Cont Vanc Change pip-tazo to ceftriaxone TCTS consult for possible decortication   Billy Fischer, MD ; [facility] service Mobile [phone].  After 5:30 PM or weekends, call [phone]

## 2013-01-31 NOTE — Progress Notes (Addendum)
[facility] PROGRESS NOTE Interim History: 78 y.o. male with PMH significant for non ischemic cardiomyopathy, systolic heart failure EF 15 % by ECHO 03-2012, CKD stage III, Cr baseline 2.4, chronic hydronephrosis, Non Hodgkin lymphoma, who presents to the ED complaining of progressive SOB on exertion for last week. He relates dyspnea on exertion, he has to stop exercising in the treadmill due to dyspnea. He usually exercise for 40 minutes daily. He denies chest pain, nausea, vomiting. He is only taking 10 mg of lasix. Came in with SOB, CXR and CT chest done showed left sided effusion. Thoracentesis done showing purulent material (PCCM).   Filed Weights   01/29/13 1629 01/30/13 0444 01/31/13 0454  Weight: 57.561 kg (126 lb 14.4 oz) 56.79 kg (125 lb 3.2 oz) 56.8 kg (125 lb 3.5 oz)        Intake/Output Summary (Last 24 hours) at 01/31/13 0719 Last data filed at 01/31/13 0600  Gross per 24 hour  Intake      0 ml  Output   1251 ml  Net  -1251 ml     Assessment/Plan: Acute respiratory failure/Pneumonia/Pleural effusion, left/  Empyema: -  Pulmonary consult, thoracocentesis perform with purulent material, chest tube placed. Started empirically on vanc and zosyn 11.12.2014. Repeated CXR no PTX. - Afebrile.  Paroxysmal a-fib: - likely relate to procedure - Agree with cards cont to monitor.  Chronic systolic CHF (congestive heart failure): - Euvolemic. - Agree with holding diuretics now. Not a candidate for ACEi/ ARB/aldactone due to CKD. Patient has refused more aggressive Rx with beta blocker/nitrates/hydralazine in past. Does not want cardiac cath or consideration for ICD.   CKD (chronic kidney disease), stage III - Cr baseline 2.4. He has prior history of bilateral hydronephrosis. He never follow up with urology. Will repeat renal US. - Interval improvement in severe bilateral hydronephrosis - start flomax.   Severe protein caloric mal-nutritionL: -  Ensure TID.  Code Status:  Full  Family Communication: none  Disposition Plan: inpatient  Consultants:  Pulmonology  Cardiology Procedures:  none Antibiotics Vancomycin 11/12 >>  Zosyn 11/12 >>      HPI/Subjective: SOB improved. No complains  Objective: Filed Vitals:   01/30/13 1715 01/30/13 1819 01/30/13 2024 01/31/13 0454  BP: 128/70 105/63 99/54 98/59   Pulse: 84 80 91 97  Temp: 98.8 F (37.1 C) 97.4 F (36.3 C) 98.1 F (36.7 C) 97.7 F (36.5 C)  TempSrc: Oral Oral Oral Oral  Resp: 22 18 18 18   Height:      Weight:    56.8 kg (125 lb 3.5 oz)  SpO2: 99% 99% 94% 96%     Exam:  General: Alert, awake, oriented x3, in no acute distress.  HEENT: No bruits, no goiter.  Heart: Regular rate and rhythm,  Lungs: moderate air movement, decrease sounds on left. Abdomen: Soft, nontender, nondistended, positive bowel sounds.  Neuro: Grossly intact, nonfocal.   Data Reviewed: Basic Metabolic Panel:  Recent Labs Lab 01/29/13 1105 01/30/13 0200  NA 140 137  K 4.3 4.9  CL 108 106  CO2  --  17*  GLUCOSE 101* 200*  BUN 62* 67*  CREATININE 2.40* 2.33*  CALCIUM  --  8.4   Liver Function Tests:  Recent Labs Lab 01/30/13 1715  PROT 7.1   No results found for this basename: LIPASE, AMYLASE,  in the last 168 hours No results found for this basename: AMMONIA,  in the last 168 hours CBC:  Recent Labs Lab 01/29/13 1050 01/29/13 1105 01/30/13  0200 01/31/13 0518  WBC 7.1  --  6.9 7.9  NEUTROABS 6.0  --   --   --   HGB 9.0* 9.9* 8.8* 10.2*  HCT 26.5* 29.0* 26.1* 31.6*  MCV 83.6  --  83.1 85.4  PLT 290  --  288 316   Cardiac Enzymes:  Recent Labs Lab 01/29/13 1910 01/30/13 0200  TROPONINI <0.30 <0.30   BNP (last 3 results)  Recent Labs  04/12/12 1224 01/29/13 1050  PROBNP 18809.0* 11562.0*   CBG: No results found for this basename: GLUCAP,  in the last 168 hours  Recent Results (from the past 240 hour(s))  BODY FLUID CULTURE     Status: None   Collection Time     01/30/13  3:37 PM      Result Value Range Status   Specimen Description PLEURAL FLUID LEFT   Final   Special Requests 6.OML FLUID   Final   Gram Stain PENDING   Incomplete   Culture     Final   Value: NO GROWTH     Performed at [facility]   Report Status PENDING   Incomplete     Studies: Dg Chest 2 View  01/29/2013   CLINICAL DATA:  Shortness of breath, productive cough, weakness, and weight loss. There is history of coronary artery disease, CHF, and chronic renal failure.  EXAM: CHEST  2 VIEW  COMPARISON:  April 15, 2012.  FINDINGS: There is volume loss on the left likely secondary to a pleural effusion. On the right the lung is well-expanded and clear. The cardiac silhouette is top-normal in size. The pulmonary vascularity is not engorged. There is tortuosity of the descending thoracic aorta. There is curvature of the thoracic spine with the convexity towards the right.  IMPRESSION: There has been interval increase in the size of the left pleural effusion. No significant effusion on the right is demonstrated today.   Electronically Signed   By: David  [place]   On: 01/29/2013 11:21   Ct Chest Wo Contrast  01/29/2013   CLINICAL DATA:  Increasing dyspnea.  Productive cough.  EXAM: CT CHEST WITHOUT CONTRAST  TECHNIQUE: Multidetector CT imaging of the chest was performed following the standard protocol without IV contrast.  COMPARISON:  Chest radiographs obtained earlier today and chest CT dated 03/27/2006.  FINDINGS: Interval moderate to large-sized loculated pleural effusion in the left mid and lower lung zones. There is also a suggestion of a faintly visible pleural based mass inferiorly and medially, measuring 2.5 x 1.9 cm on image number 59. There is also a probable 2nd pleural based mass medially and inferiorly, measuring 2.8 x 1.5 cm on image number 64. Visualization of these masses is somewhat limited due to the lack of intravenous contrast. There is compressive atelectasis and  some parenchymal calcification medial to the pleural fluid as well as deviation of the heart to the right. There is also patchy airspace opacity in the adjacent superior segment of the left lower lobe.  Dense proximal coronary artery calcifications are noted. Also noted is mild right lower lobe atelectasis. No enlarged lymph nodes are seen. Again demonstrated are bilateral renal cysts. These include extensive bilateral parapelvic cysts and possible chronic hydronephrosis. There are also small gallstones in the gallbladder, the largest measuring 3 mm in maximum diameter. Degenerative changes in the thoracic and cervical spine with changes of DISH. Mild to moderate dextro convex scoliosis.  IMPRESSION: 1. Moderate to large-sized loculated left pleural fluid with possible pleural-based masses. These  findings are concerning for a loculated malignant pleural effusion. Further evaluation with thoracentesis is recommended. The loculated pleural fluid is causing deviation of the heart to the right. 2. Probable pneumonia in the superior segment of the left lower lobe. 3. Bilateral lower lobe atelectasis, greater on the left. 4. Dense proximal coronary artery calcifications. 5. Cholelithiasis. 6. Chronic bilateral renal parapelvic cysts and possible UPJ obstructions.   Electronically Signed   By: Gordan Payment M.D.   On: 01/29/2013 23:08   Dg Chest Left Decubitus  01/29/2013   CLINICAL DATA:  Evaluate for layering effusion  EXAM: CHEST - LEFT DECUBITUS  COMPARISON:  Prior chest x-ray 01/29/2013  FINDINGS: No evidence of layering pleural of fluid. Otherwise, no significant interval change in the appearance of the chest compared to earlier today.  IMPRESSION: No layering fluid. The left basilar opacity may reflect a loculated pleural effusion, dense consolidation, or potentially an underlying mass. Recommend further evaluation with contrasted CT scan of the chest.   Electronically Signed   By: Malachy Moan M.D.   On:  01/29/2013 15:47   US Renal  01/29/2013   CLINICAL DATA:  Followup hydronephrosis  EXAM: RENAL/URINARY TRACT ULTRASOUND COMPLETE  COMPARISON:  04/16/2012  FINDINGS: Right Kidney  Length: 10.4 cm. Echogenicity is within normal limits. There is been significant improvement in right hydronephrosis with residual wall pelvocaliectasis. Cyst within the inferior pole measures 10 x 7 x 8 mm.  Left Kidney  Length: Measures 10.7 cm. Echogenicity is within normal limits. There is moderate left hydronephrosis. This is improved when compared with the previous exam. Cyst within the upper pole measures 1.4 x 1.7 x 1.5 cm.  Bladder  Appears normal for degree of bladder distention.  Other:   The prostate gland is enlarged measuring 5 x 4.8 x 5.8 cm  IMPRESSION: 1. Interval improvement in severe bilateral hydronephrosis. There is residual right pelvocaliectasis and moderate left hydronephrosis. 2. Bilateral renal cysts. 3. Prostate gland enlargement.   Electronically Signed   By: Signa Kell M.D.   On: 01/29/2013 17:06   Dg Chest Port 1 View  01/30/2013   CLINICAL DATA:  Status post left-sided chest tube insertion.  EXAM: PORTABLE CHEST - 1 VIEW  COMPARISON:  CT scan of chest dated January 29, 2013.  FINDINGS: A large caliber chest tube is been placed on the left with the tip of the tube to lie between the posterior aspects of the left 8 and 9th ribs medially. There has been interval improvement in the opacity of the left mid and lower hemithorax consistent with drainage of pleural fluid. There is pleural space air now associated with residual fluid and thickened pleura. There is no mediastinal shift.  The cardiac silhouette is top-normal in size. The pulmonary vascularity is not engorged. There is tortuosity of the descending thoracic aorta.  IMPRESSION: The patient has undergone interval drainage of a large amount of of fluid from the left pleural space by placement of a large caliber chest tube. There is residual soft  tissue density present and there is pleural space air visible in the lower hemithorax. No apical pneumothorax is demonstrated.   Electronically Signed   By: David  [place]   On: 01/30/2013 16:47    Scheduled Meds: . heparin  5,000 Units Subcutaneous Q8H  . piperacillin-tazobactam (ZOSYN)  IV  2.25 g Intravenous Q8H  . sodium chloride  3 mL Intravenous Q12H  . sodium chloride  3 mL Intravenous Q12H  . vancomycin  750 mg Intravenous  Q48H   Continuous Infusions:    Marinda Elk  [facility] Pager [phone]. If 8PM-8AM, please contact night-coverage at [email or web address], password [facility] 01/31/2013, 7:19 AM  LOS: 2 days

## 2013-01-31 NOTE — Consult Note (Signed)
[address]       [place] [address]             [phone]        [name] [facility] Medical Record #[id number] Date of Birth: [birth year]  Referring: No ref. provider found Primary Care: [name], MD  Chief Complaint:    Chief Complaint  Patient presents with  . Shortness of Breath    History of Present Illness:     This is a 78 year old African American male with a past medical history of CHF, ischemic cardiomyopathy (LVEF 15% as of Jan 2014), mild to moderate AI and moderate MR (by echo 04/13/2012),CKD, and Non Hodgkin's lymphoma who presented to [facility] Emergency Department with complaints of increasing dyspnea with exertion and productive cough for about the last week. CT scan of the chest showed moderate to large loculated left pleural effusion, probable pneumonia in the superior segment of the LLL, and bilateral lower lobe atelectasis L>R.In addition, there was also the suggestion of pleural based masses medially and inferiorly.He underwent a left thoracentesis on 11/12. 800 cc of purulent fluid were removed. Gram stain showed abundant WBC and gram positive cocci. He is on Vancomycin and Rocephin. A thoracic consultation  for the consideration of a left VATS, decortication.  Current Activity/ Functional Status: Patient is independent with mobility/ambulation, transfers, ADL's, IADL's.   Zubrod Score: At the time of surgery this patient's most appropriate activity status/level should be described as: []  Normal activity, no symptoms [x]  Symptoms, fully ambulatory []  Symptoms, in bed less than or equal to 50% of the time []  Symptoms, in bed greater than 50% of the time but less than 100% []  Bedridden []  Moribund  Past Medical History  Diagnosis Date  . Aortic insufficiency     a. Previously mod-severe;  b. 03/2012 Echo: Mild to Mod AI  . Atrial flutter     resolved  . Anemia   . PVCs (premature ventricular contractions)   .  Chronic systolic CHF (congestive heart failure)     a.  03/2012 Echo: EF 15%, Mild to Mod AI, Mod MR  . Bilateral hydronephrosis     a. 03/2012 Renal u/s: Bilat Severe hydronephrosis, medical renal dzs.  . CKD (chronic kidney disease), stage III   . Non Hodgkin's lymphoma     resolved >10 yr ago  . Moderate mitral regurgitation     a.  03/2012 Echo: Mod MR  . BPH (benign prostatic hyperplasia)     a. noted on renal u/s 03/2012.  . Ischemic cardiomyopathy     a.  Lexiscan Myoview 04/24/12: Inferior apical MI, study not gated due to frequent PVCs, triplets => patient refuses cath  . Gout     Past Surgical History  Procedure Laterality Date  . Lymph node dissection      Groin  . Inguinal hernia repair      History  Smoking status  . Never Smoker   Smokeless tobacco  . Never Used    History  Alcohol Use No    History   Social History  . Marital Status: Legally Separated    Spouse Name: N/A    Number of Children: 1  . Years of Education: N/A   Occupational History  .     Social History Main Topics  . Smoking status: Never Smoker   . Smokeless tobacco: Never Used  . Alcohol Use: No  . Drug Use: No  .  Sexual Activity: Not on file   Other Topics Concern  . Not on file   Social History Narrative  . No narrative on file    Allergies  Allergen Reactions  . Contrast Media [Iodinated Diagnostic Agents] Other (See Comments)    Patient does not want dye because it is hard on his kidneys  . Prednisone Rash  . Shellfish Allergy Itching and Rash    Crab meat    Current Facility-Administered Medications  Medication Dose Route Frequency Provider Last Rate Last Dose  . 0.9 %  sodium chloride infusion  250 mL Intravenous PRN Belkys A Regalado, MD      . acetaminophen (TYLENOL) tablet 650 mg  650 mg Oral Q6H PRN Belkys A Regalado, MD       Or  . acetaminophen (TYLENOL) suppository 650 mg  650 mg Rectal Q6H PRN Belkys A Regalado, MD      . cefTRIAXone (ROCEPHIN) 2 g in  dextrose 5 % 50 mL IVPB  2 g Intravenous Q24H Merwyn Katos, MD      . heparin injection 5,000 Units  5,000 Units Subcutaneous Q8H Belkys A Regalado, MD   5,000 Units at 01/31/13 1500  . HYDROcodone-acetaminophen (NORCO/VICODIN) 5-325 MG per tablet 1-2 tablet  1-2 tablet Oral Q4H PRN Alba Cory, MD   2 tablet at 01/30/13 2005  . nitroGLYCERIN (NITROSTAT) SL tablet 0.4 mg  0.4 mg Sublingual Q5 Min x 3 PRN Belkys A Regalado, MD      . sodium chloride 0.9 % injection 3 mL  3 mL Intravenous Q12H Belkys A Regalado, MD   3 mL at 01/30/13 2150  . sodium chloride 0.9 % injection 3 mL  3 mL Intravenous Q12H Belkys A Regalado, MD   3 mL at 01/30/13 2150  . sodium chloride 0.9 % injection 3 mL  3 mL Intravenous PRN Belkys A Regalado, MD      . tamsulosin (FLOMAX) capsule 0.4 mg  0.4 mg Oral QPC supper Marinda Elk, MD      . vancomycin (VANCOCIN) 500 mg in sodium chloride 0.9 % 100 mL IVPB  500 mg Intravenous Q24H Riki Rusk, [facility]        Prescriptions prior to admission  Medication Sig Dispense Refill  . furosemide (LASIX) 40 MG tablet Take 40 mg by mouth daily as needed for fluid.      . nitroGLYCERIN (NITROSTAT) 0.4 MG SL tablet Place 1 tablet (0.4 mg total) under the tongue every 5 (five) minutes x 3 doses as needed for chest pain.  25 tablet  3  . triamcinolone cream (KENALOG) 0.1 % Apply 1 application topically daily as needed. For rash        Family History  Problem Relation Age of Onset  . Prostate cancer Father      Review of Systems:     Cardiac Review of Systems: Y or N  Chest Pain [  n  ]  Resting SOB [ n  ] Exertional SOB  [ y ]  Orthopnea [ n ]   Pedal Edema [ n  ]    Palpitations [ n ] Syncope  [ n ]   Presyncope [ n  ]  General Review of Systems: [Y] = yes [  ]=no Constitional: recent weight change [place].Etienne  ]; fatigue [ y]; nausea [n  ]; night sweats [n  ]; fever [n  ]; or chills Milo.Brash  ]  Eye : blurred vision  [ n ]; diplopia [   n]; vision changes [  n];  Amaurosis fugax[  n]; Resp: cough [ y ];  wheezing[ n ];  hemoptysis[ n ]; shortness of breath[ y ]; paroxysmal nocturnal dyspnea[ n ]; dyspnea on exertion[y  ];  GI:  gallstones[ y ], vomiting[ n ];  dysphagia[n  ]; melena[ n ];  hematochezia [n  ]; heartburn[n  ] GU: kidney stones [ n ]; hematuria[ n ];   dysuria [ n] ;urinary frequency [ n ]             Skin: rash, swelling[ n ];, hair loss[n  ];  peripheral edema[ n ];  or itching[ n ]; Musculosketetal: myalgias[ n ];  joint swelling[ n ];  joint erythema[ n ];  Heme/Lymph: bruising[n  ];  bleeding[n  ];  Anemia[y] Neuro: TIA[ n ];  headaches[ n ];  stroke[n  ];  vertigo[ n ];  seizures[ n ];   paresthesias[n  ];  difficulty walking[ n ];  Psych:depression[n  ]; anxiety[ n ];  Endocrine: diabetes[ n ];  thyroid dysfunction[n  ];     Physical Exam: BP 98/59  Pulse 90  Temp(Src) 98.2 F (36.8 C) (Oral)  Resp 18  Ht 6' (1.829 m)  Wt 56.8 kg (125 lb 3.5 oz)  BMI 16.98 kg/m2  SpO2 97%  General appearance: alert, cooperative, cachectic and no distress. He is on 2 liters of oxygen via [place]. HEENT: Head- atraumatic, normocephalic Eyes-EOMI, sclera non icteric Neck-supple, no JVD Neurologic: intact Heart: irregularly irregular rhythm and systolic murmur:  Lungs: Coarse breath sounds on the left > right Abdomen: soft, non-tender; bowel sounds normal; no masses,  no organomegaly Extremities: extremities normal, atraumatic, no cyanosis or edema Chest tube is to suction. There is tidling in the tube with cough, but no obvious air leak.  Diagnostic Studies & Laboratory data:     Recent Radiology Findings:   Ct Chest Wo Contrast  01/29/2013   CLINICAL DATA:  Increasing dyspnea.  Productive cough.  EXAM: CT CHEST WITHOUT CONTRAST  TECHNIQUE: Multidetector CT imaging of the chest was performed following the standard protocol without IV contrast.  COMPARISON:  Chest radiographs obtained earlier  today and chest CT dated 03/27/2006.  FINDINGS: Interval moderate to large-sized loculated pleural effusion in the left mid and lower lung zones. There is also a suggestion of a faintly visible pleural based mass inferiorly and medially, measuring 2.5 x 1.9 cm on image number 59. There is also a probable 2nd pleural based mass medially and inferiorly, measuring 2.8 x 1.5 cm on image number 64. Visualization of these masses is somewhat limited due to the lack of intravenous contrast. There is compressive atelectasis and some parenchymal calcification medial to the pleural fluid as well as deviation of the heart to the right. There is also patchy airspace opacity in the adjacent superior segment of the left lower lobe.  Dense proximal coronary artery calcifications are noted. Also noted is mild right lower lobe atelectasis. No enlarged lymph nodes are seen. Again demonstrated are bilateral renal cysts. These include extensive bilateral parapelvic cysts and possible chronic hydronephrosis. There are also small gallstones in the gallbladder, the largest measuring 3 mm in maximum diameter. Degenerative changes in the thoracic and cervical spine with changes of DISH. Mild to moderate dextro convex scoliosis.  IMPRESSION: 1. Moderate to large-sized loculated left pleural fluid with possible pleural-based masses. These findings are concerning for a loculated malignant  pleural effusion. Further evaluation with thoracentesis is recommended. The loculated pleural fluid is causing deviation of the heart to the right. 2. Probable pneumonia in the superior segment of the left lower lobe. 3. Bilateral lower lobe atelectasis, greater on the left. 4. Dense proximal coronary artery calcifications. 5. Cholelithiasis. 6. Chronic bilateral renal parapelvic cysts and possible UPJ obstructions.   Electronically Signed   By: Gordan Payment M.D.   On: 01/29/2013 23:08   Dg Chest Left Decubitus  01/29/2013   CLINICAL DATA:  Evaluate for  layering effusion  EXAM: CHEST - LEFT DECUBITUS  COMPARISON:  Prior chest x-ray 01/29/2013  FINDINGS: No evidence of layering pleural of fluid. Otherwise, no significant interval change in the appearance of the chest compared to earlier today.  IMPRESSION: No layering fluid. The left basilar opacity may reflect a loculated pleural effusion, dense consolidation, or potentially an underlying mass. Recommend further evaluation with contrasted CT scan of the chest.   Electronically Signed   By: Malachy Moan M.D.   On: 01/29/2013 15:47   US Renal  01/29/2013   CLINICAL DATA:  Followup hydronephrosis  EXAM: RENAL/URINARY TRACT ULTRASOUND COMPLETE  COMPARISON:  04/16/2012  FINDINGS: Right Kidney  Length: 10.4 cm. Echogenicity is within normal limits. There is been significant improvement in right hydronephrosis with residual wall pelvocaliectasis. Cyst within the inferior pole measures 10 x 7 x 8 mm.  Left Kidney  Length: Measures 10.7 cm. Echogenicity is within normal limits. There is moderate left hydronephrosis. This is improved when compared with the previous exam. Cyst within the upper pole measures 1.4 x 1.7 x 1.5 cm.  Bladder  Appears normal for degree of bladder distention.  Other:   The prostate gland is enlarged measuring 5 x 4.8 x 5.8 cm  IMPRESSION: 1. Interval improvement in severe bilateral hydronephrosis. There is residual right pelvocaliectasis and moderate left hydronephrosis. 2. Bilateral renal cysts. 3. Prostate gland enlargement.   Electronically Signed   By: Signa Kell M.D.   On: 01/29/2013 17:06   Dg Chest Port 1 View  01/30/2013   CLINICAL DATA:  Status post left-sided chest tube insertion.  EXAM: PORTABLE CHEST - 1 VIEW  COMPARISON:  CT scan of chest dated January 29, 2013.  FINDINGS: A large caliber chest tube is been placed on the left with the tip of the tube to lie between the posterior aspects of the left 8 and 9th ribs medially. There has been interval improvement in the  opacity of the left mid and lower hemithorax consistent with drainage of pleural fluid. There is pleural space air now associated with residual fluid and thickened pleura. There is no mediastinal shift.  The cardiac silhouette is top-normal in size. The pulmonary vascularity is not engorged. There is tortuosity of the descending thoracic aorta.  IMPRESSION: The patient has undergone interval drainage of a large amount of of fluid from the left pleural space by placement of a large caliber chest tube. There is residual soft tissue density present and there is pleural space air visible in the lower hemithorax. No apical pneumothorax is demonstrated.   Electronically Signed   By: David  [place]   On: 01/30/2013 16:47      Recent Lab Findings: Lab Results  Component Value Date   WBC 7.9 01/31/2013   HGB 10.2* 01/31/2013   HCT 31.6* 01/31/2013   PLT 316 01/31/2013   GLUCOSE 138* 01/31/2013   CHOL 138 04/13/2012   TRIG 68 04/13/2012   HDL 60 04/13/2012  LDLCALC 64 04/13/2012   ALT 28 04/16/2012   AST 34 04/16/2012   NA 135 01/31/2013   K 5.8* 01/31/2013   CL 107 01/31/2013   CREATININE 1.96* 01/31/2013   BUN 57* 01/31/2013   CO2 19 01/31/2013   TSH 2.605 04/12/2012   INR 1.27 04/12/2012      Assessment / Plan:      1.Dyspnea secondary to loculated left pleural effusion. CT also showed two possible pleural based masses (possible cancer?) and pneumonia/empyema. He is s/p left thoracentesis 11/12 with 800 cc of purulent fluid removed. Left pleural fluid showed abundant WBC and gram positive cocci.On Vancomycin and Rocephin.   2.Chronic congestive heart failure. Diuretics are on hold for now. Unable to take ACE or ARB secondary to CKD.Marland Kitchen Has had previous refusal of medications, cath, or ICD. Per cardiology.  3.PAF with HR in the 110's. Per cardiology.  4.CKD (stage III). Baseline Cr is 2.4. CT showed chronic bilateral renal parapelvic cysts and possible UPJ obstruction. Renal US showed severe  bilateral hydronephrosis (has had since 1/14)    Doree Fudge PA-C  01/31/2013 3:36 PM  Patient has question of pleural masses , and has not reexpanded the left lung after chest tube. I discussed this with the patient. He would be high-risk for any interventional procedure due to his multiple comorbidities particularly poor cardiac function and renal function. He has a history of non-Hodgkin's lymphoma treated with chemotherapy 12 years ago. Recommended to him we consider left video-assisted thoracoscopy to get adequate drainage of the left pleural space the pleural biopsies is unclear if with decortication can be performed successfully. We'll obtain a followup echocardiogram and renal function studies including potassium. If these are adequate to proceed could do bronchoscopy left video-assisted thoracoscopy biopsy possible decortication tomorrow afternoon. The patient is agreeable with this.  I have seen and examined [name] and agree with the above assessment  and plan.  Delight Ovens MD Beeper [phone] Office [phone] 01/31/2013 5:57 PM

## 2013-01-31 NOTE — Clinical Documentation Improvement (Signed)
Possible Clinical Conditions?  Severe Malnutrition   Protein Calorie Malnutrition Severe Protein Calorie Malnutrition Other Condition Cannot clinically determine   Risk Factors: Signs & Symptoms: Thin, cachetic appearing noted per 11/12 consult note.  Thank You, Rodman Pickle ,RN Clinical Documentation Specialist:  [phone]  [facility]- Health Information Management  2nd Query: Lab: 11/13: potassium: 6.2  Possible Conditions?  "  Hyperkalemia "  Other Condition "  Cannot clinically determine  Debria Garret Documentation Specialist

## 2013-01-31 NOTE — Care Management Note (Unsigned)
    Page 1 of 2   02/07/2013     4:24:12 PM   CARE MANAGEMENT NOTE 02/07/2013  Patient:  [name], [name]   Account Number:  [id number]  Date Initiated:  01/31/2013  Documentation initiated by:  Fayola Meckes  Subjective/Objective Assessment:   PT ADM ON 11/11/4 WITH CHF, ?EMPYEMA.  PTA, PT LIVES ALONE AND IS INDEPENDENT.     Action/Plan:   WILL FOLLOW FOR DC NEEDS.  WOULD RECOMMEND PT/OT CONSULTS WHEN PT ABLE TO TOLERATE THERAPY.   Anticipated DC Date:  02/08/2013   Anticipated DC Plan:  SKILLED NURSING FACILITY  In-house referral  Clinical Social Worker      DC Planning Services  CM consult      Choice offered to / List presented to:             Status of service:  In process, will continue to follow Medicare Important Message given?   (If response is "NO", the following Medicare IM given date fields will be blank) Date Medicare IM given:   Date Additional Medicare IM given:    Discharge Disposition:    Per UR Regulation:  Reviewed for med. necessity/level of care/duration of stay  If discussed at Long Length of Stay Meetings, dates discussed:   02/05/2013  02/07/2013    Comments:  02/07/13 Kayode Petion,RN,BSN [phone] CHEST TUBE DC'D TODAY.  HOPEFUL FOR DC TO SNF TOMORROW, IF PT MEDICALLY STABLE.  02/06/13 Kaisy Severino,RN,BSN [phone] CSW CONSULTED FOR SNF PLACEMENT, AS PT  LIVES ALONE AND HAS NO FAMILY SUPPORT.  POSSIBLE DC LATER IN THE WEEK.  WILL FOLLOW PROGRESS.  02/05/13 Jettie Lazare,RN,BSN [phone] PT REFUSING VATS SURGERY.  PALLIATIVE CARE FOLLOWING. PT/OT CONSULTED TODAY; PT VERY WEAK.  HE IS FROM HOME ALONE; MAY NEED SNF AT DC .  WILL FOLLOW.

## 2013-01-31 NOTE — Progress Notes (Signed)
CRITICAL VALUE ALERT  Critical value received:  post ive gram stain  Date of notification:  01/31/13  Time of notification:  0830  Critical value read back: yes   Nurse who received alert:  Edrick Oh  MD notified (1st page):  Yes   Time of first page:  0830  MD notified (2nd page):none  Time of second page:none  Responding MD: David Stall  Time MD responded: [phone]

## 2013-02-01 ENCOUNTER — Encounter (HOSPITAL_COMMUNITY)
Admission: EM | Disposition: A | Discharge status: Skilled Nursing Facility | Payer: Self-pay | Source: Home / Self Care | Attending: Internal Medicine

## 2013-02-01 ENCOUNTER — Inpatient Hospital Stay (HOSPITAL_COMMUNITY): Payer: Medicare Other

## 2013-02-01 DIAGNOSIS — I059 Rheumatic mitral valve disease, unspecified: Secondary | ICD-10-CM

## 2013-02-01 DIAGNOSIS — J869 Pyothorax without fistula: Secondary | ICD-10-CM

## 2013-02-01 LAB — CBC
HCT: 28.5 % — ABNORMAL LOW (ref 39.0–52.0)
Hemoglobin: 9.1 g/dL — ABNORMAL LOW (ref 13.0–17.0)
MCH: 27 pg (ref 26.0–34.0)
MCHC: 31.9 g/dL (ref 30.0–36.0)
MCV: 84.6 fL (ref 78.0–100.0)
Platelets: 281 10*3/uL (ref 150–400)
RBC: 3.37 MIL/uL — ABNORMAL LOW (ref 4.22–5.81)
RDW: 15.6 % — ABNORMAL HIGH (ref 11.5–15.5)
WBC: 5.4 10*3/uL (ref 4.0–10.5)

## 2013-02-01 LAB — COMPREHENSIVE METABOLIC PANEL
ALT: 15 U/L (ref 0–53)
AST: 18 U/L (ref 0–37)
Albumin: 1.7 g/dL — ABNORMAL LOW (ref 3.5–5.2)
Alkaline Phosphatase: 68 U/L (ref 39–117)
BUN: 51 mg/dL — ABNORMAL HIGH (ref 6–23)
CO2: 21 mEq/L (ref 19–32)
Calcium: 8.3 mg/dL — ABNORMAL LOW (ref 8.4–10.5)
Chloride: 109 mEq/L (ref 96–112)
Creatinine, Ser: 2.03 mg/dL — ABNORMAL HIGH (ref 0.50–1.35)
GFR calc Af Amer: 35 mL/min — ABNORMAL LOW (ref >90)
GFR calc non Af Amer: 30 mL/min — ABNORMAL LOW (ref >90)
Glucose, Bld: 86 mg/dL (ref 70–99)
Potassium: 5.1 mEq/L (ref 3.5–5.1)
Sodium: 139 mEq/L (ref 135–145)
Total Bilirubin: 0.3 mg/dL (ref 0.3–1.2)
Total Protein: 6.6 g/dL (ref 6.0–8.3)

## 2013-02-01 LAB — PROTIME-INR
INR: 1.41 (ref 0.00–1.49)
Prothrombin Time: 16.9 seconds — ABNORMAL HIGH (ref 11.6–15.2)

## 2013-02-01 SURGERY — BRONCHOSCOPY, VIDEO-ASSISTED
Anesthesia: General | Site: Chest

## 2013-02-01 MED ORDER — ENSURE COMPLETE PO LIQD
237.0000 mL | Freq: Every day | ORAL | Status: DC
  Administered 2013-02-01 – 2013-02-04 (×4): 237 mL via ORAL

## 2013-02-01 MED ORDER — METOPROLOL TARTRATE 12.5 MG HALF TABLET
12.5000 mg | ORAL_TABLET | Freq: Two times a day (BID) | ORAL | Status: DC
  Administered 2013-02-01 – 2013-02-02 (×4): 12.5 mg via ORAL
  Filled 2013-02-01 (×8): qty 1

## 2013-02-01 MED ORDER — AMIODARONE HCL 200 MG PO TABS
400.0000 mg | ORAL_TABLET | Freq: Two times a day (BID) | ORAL | Status: DC
  Administered 2013-02-01 – 2013-02-04 (×8): 400 mg via ORAL
  Filled 2013-02-01 (×10): qty 2

## 2013-02-01 NOTE — Progress Notes (Signed)
[facility] PROGRESS NOTE Interim History: 78 y.o. male with PMH significant for non ischemic cardiomyopathy, systolic heart failure EF 15 % by ECHO 03-2012, CKD stage III, Cr baseline 2.4, chronic hydronephrosis, Non Hodgkin lymphoma, who presents to the ED complaining of progressive SOB on exertion for last week. He relates dyspnea on exertion, he has to stop exercising in the treadmill due to dyspnea. He usually exercise for 40 minutes daily. He denies chest pain, nausea, vomiting. He is only taking 10 mg of lasix. Came in with SOB, CXR and CT chest done showed left sided effusion. Thoracentesis done showing purulent material (PCCM).   Filed Weights   01/30/13 0444 01/31/13 0454 02/01/13 0515  Weight: 56.79 kg (125 lb 3.2 oz) 56.8 kg (125 lb 3.5 oz) 56.8 kg (125 lb 3.5 oz)        Intake/Output Summary (Last 24 hours) at 02/01/13 0731 Last data filed at 02/01/13 0545  Gross per 24 hour  Intake    960 ml  Output   1120 ml  Net   -160 ml     Assessment/Plan: Acute respiratory failure/Pneumonia/Pleural effusion, left/  Empyema: - Pulmonary consult, thoracocentesis 11.10.2014 Started empirically on vanc and rocephin 11.12.2014.  - he has refused VATS, d/w pulmonary recommended to Increase suction. - culture gram positive cocci in cluster. - afebrile, daily CXR.  Paroxysmal a-fib: - likely relate to procedure - Agree with cards cont to monitor.   Hyperkalemia: - resolved.  Chronic systolic CHF (congestive heart failure): - Euvolemic. - Agree with holding diuretics now. Not a candidate for ACEi/ ARB/aldactone due to CKD. Patient has refused more aggressive Rx with beta blocker/nitrates/hydralazine in past. Does not want cardiac cath or consideration for ICD.   CKD (chronic kidney disease), stage III - Cr baseline 2.4. He has prior history of bilateral hydronephrosis. He never follow up with urology. Will repeat renal US. - Interval improvement in severe bilateral  hydronephrosis - start flomax.   Severe protein caloric mal-nutritionL: -  Ensure TID.  Code Status: Full  Family Communication: none  Disposition Plan: inpatient  Consultants:  Pulmonology  Cardiology Procedures:  none Antibiotics Vancomycin 11/12 >>  rocephin 11/12 >>      HPI/Subjective: SOB improved. No complains  Objective: Filed Vitals:   01/31/13 0454 01/31/13 1401 01/31/13 2039 02/01/13 0515  BP: 98/59 98/59 98/55  97/57  Pulse: 97 90 89 87  Temp: 97.7 F (36.5 C) 98.2 F (36.8 C) 97.8 F (36.6 C) 98.2 F (36.8 C)  TempSrc: Oral Oral Oral Oral  Resp: 18 18 18 18   Height: 6' (1.829 m)     Weight: 56.8 kg (125 lb 3.5 oz)   56.8 kg (125 lb 3.5 oz)  SpO2: 96% 97% 99% 96%     Exam:  General: Alert, awake, oriented x3, in no acute distress.  HEENT: No bruits, no goiter.  Heart: Regular rate and rhythm,  Lungs: moderate air movement, decrease sounds on left. Abdomen: Soft, nontender, nondistended, positive bowel sounds.  Neuro: Grossly intact, nonfocal.   Data Reviewed: Basic Metabolic Panel:  Recent Labs Lab 01/30/13 0200 01/31/13 0518 01/31/13 1012 01/31/13 1955 02/01/13 0545  NA 137 136 135 135 139  K 4.9 6.2* 5.8* 5.2* 5.1  CL 106 108 107 105 109  CO2 17* 16* 19 21 21   GLUCOSE 200* 98 138* 127* 86  BUN 67* 60* 57* 54* 51*  CREATININE 2.33* 2.12* 1.96* 2.14* 2.03*  CALCIUM 8.4 8.8 8.5 8.3* 8.3*   Liver Function Tests:  Recent Labs Lab 01/30/13 1715 01/31/13 1955 02/01/13 0545  AST  --  30 18  ALT  --  17 15  ALKPHOS  --  70 68  BILITOT  --  0.2* 0.3  PROT 7.1 6.7 6.6  ALBUMIN  --  1.7* 1.7*   No results found for this basename: LIPASE, AMYLASE,  in the last 168 hours No results found for this basename: AMMONIA,  in the last 168 hours CBC:  Recent Labs Lab 01/29/13 1050 01/29/13 1105 01/30/13 0200 01/31/13 0518 01/31/13 1955 02/01/13 0545  WBC 7.1  --  6.9 7.9 6.5 5.4  NEUTROABS 6.0  --   --   --   --   --   HGB 9.0*  9.9* 8.8* 10.2* 9.4* 9.1*  HCT 26.5* 29.0* 26.1* 31.6* 29.3* 28.5*  MCV 83.6  --  83.1 85.4 85.4 84.6  PLT 290  --  288 316 267 281   Cardiac Enzymes:  Recent Labs Lab 01/29/13 1910 01/30/13 0200  TROPONINI <0.30 <0.30   BNP (last 3 results)  Recent Labs  04/12/12 1224 01/29/13 1050  PROBNP 18809.0* 11562.0*   CBG: No results found for this basename: GLUCAP,  in the last 168 hours  Recent Results (from the past 240 hour(s))  BODY FLUID CULTURE     Status: None   Collection Time    01/30/13  3:37 PM      Result Value Range Status   Specimen Description PLEURAL FLUID LEFT   Final   Special Requests 6.OML FLUID   Final   Gram Stain     Final   Value: ABUNDANT WBC PRESENT,BOTH PMN AND MONONUCLEAR     ABUNDANT GRAM POSITIVE COCCI IN CLUSTERS     Gram Stain Report Called to,Read Back By and Verified With: Gram Stain Report Called to,Read Back By and Verified With:  JAMES A @813  ON 01/31/13 BY SMIAS     Performed at [facility]   Culture     Final   Value: NO GROWTH     Performed at [facility]   Report Status PENDING   Incomplete  SURGICAL PCR SCREEN     Status: None   Collection Time    01/31/13  8:16 PM      Result Value Range Status   MRSA, PCR NEGATIVE  NEGATIVE Final   Staphylococcus aureus NEGATIVE  NEGATIVE Final   Comment:            The Xpert SA Assay (FDA     approved for NASAL specimens     in patients over 33 years of age),     is one component of     a comprehensive surveillance     program.  Test performance has     been validated by [employer] for patients greater     than or equal to 24 year old.     It is not intended     to diagnose infection nor to     guide or monitor treatment.     Studies: Dg Chest Port 1 View  01/30/2013   CLINICAL DATA:  Status post left-sided chest tube insertion.  EXAM: PORTABLE CHEST - 1 VIEW  COMPARISON:  CT scan of chest dated January 29, 2013.  FINDINGS: A large caliber chest tube is been  placed on the left with the tip of the tube to lie between the posterior aspects of the left 8 and 9th ribs  medially. There has been interval improvement in the opacity of the left mid and lower hemithorax consistent with drainage of pleural fluid. There is pleural space air now associated with residual fluid and thickened pleura. There is no mediastinal shift.  The cardiac silhouette is top-normal in size. The pulmonary vascularity is not engorged. There is tortuosity of the descending thoracic aorta.  IMPRESSION: The patient has undergone interval drainage of a large amount of of fluid from the left pleural space by placement of a large caliber chest tube. There is residual soft tissue density present and there is pleural space air visible in the lower hemithorax. No apical pneumothorax is demonstrated.   Electronically Signed   By: David  [place]   On: 01/30/2013 16:47    Scheduled Meds: . cefTRIAXone (ROCEPHIN)  IV  2 g Intravenous Q24H  . cefUROXime (ZINACEF)  IV  1.5 g Intravenous 60 min Pre-Op  . sodium chloride  3 mL Intravenous Q12H  . sodium chloride  3 mL Intravenous Q12H  . tamsulosin  0.4 mg Oral QPC supper  . vancomycin  500 mg Intravenous Q24H   Continuous Infusions:    Marinda Elk  [facility] Pager [phone]. If 8PM-8AM, please contact night-coverage at [email or web address], password [email or web address] 02/01/2013, 7:31 AM  LOS: 3 days

## 2013-02-01 NOTE — Progress Notes (Signed)
INITIAL NUTRITION ASSESSMENT  DOCUMENTATION CODES Per approved criteria  -Severe malnutrition in the context of chronic illness -Underweight   INTERVENTION:  Ensure Complete daily (350 kcals, 13 gm protein per 8 fl oz bottle) RD to follow for nutrition care plan  NUTRITION DIAGNOSIS: Increased nutrient needs related to wound healing, malnutrition as evidenced by estimated nutrition needs  Goal: Pt to meet >/= 90% of their estimated nutrition needs   Monitor:  PO & supplemental intake, weight, labs, I/O's  Reason for Assessment: BMI < 18.5  78 y.o. male  Admitting Dx: Pleural effusion, left  ASSESSMENT: Patient with PMH of systolic HF, non ischemic cardiomyopathy, CKD, chronic hydronephrosis and non Hodgkin lymphoma who presened to ED complaining of progressive shortness of breath on exertion.  Patient reports he usually consumes 2 meals per day, however, some days he doesn't well; PO intake 50-100% per flowsheet records; per weight readings, patient has had a 11% weight loss since February 2014 (not significant for time frame); + severe muscle loss to upper body; would like Ensure daily after supper -- RD to order.  Patient meets criteria for severe malnutrition in the context of chronic illness as evidenced by < 75% intake of estimated energy requirement for > 1 month and severe muscle loss (temples, clavicles, acromion bone region).   Height: Ht Readings from Last 1 Encounters:  01/31/13 6' (1.829 m)    Weight: Wt Readings from Last 1 Encounters:  02/01/13 125 lb 3.5 oz (56.8 kg)    Ideal Body Weight: 178 lb  % Ideal Body Weight: 70%  Wt Readings from Last 10 Encounters:  02/01/13 125 lb 3.5 oz (56.8 kg)  02/01/13 125 lb 3.5 oz (56.8 kg)  06/22/12 136 lb (61.689 kg)  04/24/12 149 lb 6.4 oz (67.767 kg)  04/23/12 141 lb (63.957 kg)  04/17/12 147 lb 0.8 oz (66.7 kg)  02/22/12 151 lb 12.8 oz (68.856 kg)    Usual Body Weight: 141 lb -- February 2014  % Usual  Body Weight: 88%  BMI:  Body mass index is 16.98 kg/(m^2).  Estimated Nutritional Needs: Kcal: 1600-1800 Protein: 75-85 gm Fluid: 1.6-1.8 L  Skin: Stage II pressure ulcer to buttocks  Diet Order: Cardiac  EDUCATION NEEDS: -No education needs identified at this time   Intake/Output Summary (Last 24 hours) at 02/01/13 1421 Last data filed at 02/01/13 0824  Gross per 24 hour  Intake    720 ml  Output   1170 ml  Net   -450 ml    Labs:   Recent Labs Lab 01/31/13 1012 01/31/13 1955 02/01/13 0545  NA 135 135 139  K 5.8* 5.2* 5.1  CL 107 105 109  CO2 19 21 21   BUN 57* 54* 51*  CREATININE 1.96* 2.14* 2.03*  CALCIUM 8.5 8.3* 8.3*  GLUCOSE 138* 127* 86    Scheduled Meds: . amiodarone  400 mg Oral BID  . cefTRIAXone (ROCEPHIN)  IV  2 g Intravenous Q24H  . cefUROXime (ZINACEF)  IV  1.5 g Intravenous 60 min Pre-Op  . metoprolol tartrate  12.5 mg Oral BID  . sodium chloride  3 mL Intravenous Q12H  . sodium chloride  3 mL Intravenous Q12H  . tamsulosin  0.4 mg Oral QPC supper  . vancomycin  500 mg Intravenous Q24H    Continuous Infusions:   Past Medical History  Diagnosis Date  . Aortic insufficiency     a. Previously mod-severe;  b. 03/2012 Echo: Mild to Mod AI  . Atrial flutter  resolved  . Anemia   . PVCs (premature ventricular contractions)   . Chronic systolic CHF (congestive heart failure)     a.  03/2012 Echo: EF 15%, Mild to Mod AI, Mod MR  . Bilateral hydronephrosis     a. 03/2012 Renal u/s: Bilat Severe hydronephrosis, medical renal dzs.  . CKD (chronic kidney disease), stage III   . Non Hodgkin's lymphoma     resolved >10 yr ago  . Moderate mitral regurgitation     a.  03/2012 Echo: Mod MR  . BPH (benign prostatic hyperplasia)     a. noted on renal u/s 03/2012.  . Ischemic cardiomyopathy     a.  Lexiscan Myoview 04/24/12: Inferior apical MI, study not gated due to frequent PVCs, triplets => patient refuses cath  . Gout     Past Surgical History   Procedure Laterality Date  . Lymph node dissection      Groin  . Inguinal hernia repair      Maureen Chatters, RD, LDN Pager #: [phone] After-Hours Pager #: [phone]

## 2013-02-01 NOTE — Progress Notes (Signed)
PULMONARY  / CRITICAL CARE MEDICINE  Name: [name] MRN: [id number] DOB: [birth year]    ADMISSION DATE:  01/29/2013 CONSULTATION DATE:  01/30/13   REFERRING MD :  [facility] PRIMARY SERVICE:  TR[facility]  CHIEF COMPLAINT:  SOB  BRIEF PATIENT DESCRIPTION: 78 y.o. With PMH of CHF, CKD, ischemic cardiomyopathy, and h/o Non hodgkins lymphoma admitted by [facility] 11/11 for 1 week history of worsening SOB and productive cough.  CT scan of chest w/o contrast showed large left pleural effusion w/ possible loculation and two poorly visualized masses.    SIGNIFICANT EVENTS / STUDIES:  11/11 Admitted shortness of breath 11/11 CT chest w/o contrast >> Large left pleural effusion, possible loculations and right sided deviation of the heart, difficult to visualize masses likely pleural based 11/12 Left Thoracentesis >>> 1L pus 11/12 CT placed  11/13 TCTS consult for surgical drainage and mgmt of trapped lung. Pt refused VATS 11/14 Some improvement/ re-expansion of LLL  TUBES/LINES: 11/12 L Chest tube >>   CULTURES: 11/12 pleural fluid >> abundant GPC clusters >>   ANTIBIOTICS: Zosyn 11/12>> 11/13 Vancomycin 11/12 >>  Ceftriaxone 11/13 >>   SUBJECTIVE:  No new complaints  VITAL SIGNS: Temp:  [97.6 F (36.4 C)-98.2 F (36.8 C)] 97.6 F (36.4 C) (11/14 1447) Pulse Rate:  [85-89] 85 (11/14 1447) Resp:  [17-18] 17 (11/14 1447) BP: (90-98)/(55-57) 90/57 mmHg (11/14 1447) SpO2:  [96 %-100 %] 100 % (11/14 1447) Weight:  [56.8 kg (125 lb 3.5 oz)] 56.8 kg (125 lb 3.5 oz) (11/14 0515)  PHYSICAL EXAMINATION: General:  NAD Neuro: No focal deficits HEENT: WNL Cardiovascular:  RRR without M Lungs: clear anteriorly Abdomen:  +BS, soft, non-tender Ext: No edema   I have reviewed all of today's lab results. Relevant abnormalities are discussed in the A/P section   CXR: Slightly improved L pneumothorax   ASSESSMENT: L empyema - staph organism by GS Trapped LLL  REC -  Cont Vanc and  ceftriaxone Increased suction on chest tube Follow daily chest Xrays   Billy Fischer, MD ; [facility] service Mobile [phone].  After 5:30 PM or weekends, call [phone]

## 2013-02-01 NOTE — Progress Notes (Signed)
[address]       [name] [address]             [phone]                   Procedure(s) (LRB): VIDEO BRONCHOSCOPY (N/A) VIDEO ASSISTED THORACOSCOPY (VATS)/DECORTICATION (Left) PLEURAL BIOPSY (N/A)  LOS: 3 days   Subjective: patient seen yesterday about VATS, he  Now refuses to have any surgery  Objective: Vital signs in last 24 hours: Patient Vitals for the past 24 hrs:  BP Temp Temp src Pulse Resp SpO2 Weight  02/01/13 0515 97/57 mmHg 98.2 F (36.8 C) Oral 87 18 96 % 125 lb 3.5 oz (56.8 kg)  01/31/13 2039 98/55 mmHg 97.8 F (36.6 C) Oral 89 18 99 % -  01/31/13 1401 98/59 mmHg 98.2 F (36.8 C) Oral 90 18 97 % -    Filed Weights   01/30/13 0444 01/31/13 0454 02/01/13 0515  Weight: 125 lb 3.2 oz (56.79 kg) 125 lb 3.5 oz (56.8 kg) 125 lb 3.5 oz (56.8 kg)    Hemodynamic parameters for last 24 hours:    Intake/Output from previous day: 11/13 0701 - 11/14 0700 In: 960 [P.O.:840; I.V.:120] Out: 1170 [Urine:800; Chest Tube:370] Intake/Output this shift:    Scheduled Meds: . cefTRIAXone (ROCEPHIN)  IV  2 g Intravenous Q24H  . cefUROXime (ZINACEF)  IV  1.5 g Intravenous 60 min Pre-Op  . sodium chloride  3 mL Intravenous Q12H  . sodium chloride  3 mL Intravenous Q12H  . tamsulosin  0.4 mg Oral QPC supper  . vancomycin  500 mg Intravenous Q24H   Continuous Infusions:  PRN Meds:.sodium chloride, acetaminophen, acetaminophen, HYDROcodone-acetaminophen, nitroGLYCERIN, sodium chloride  General appearance: alert, cooperative, appears older than stated age, no distress and slowed mentation Neurologic: intact Heart: irregularly irregular rhythm Lungs: diminished breath sounds LLL Abdomen: soft, non-tender; bowel sounds normal; no masses,  no organomegaly Extremities: extremities normal, atraumatic, no cyanosis or edema and Homans sign is negative, no sign of DVT  Lab Results: CBC: Recent Labs  01/31/13 1955 02/01/13 0545  WBC 6.5 5.4  HGB  9.4* 9.1*  HCT 29.3* 28.5*  PLT 267 281   BMET:  Recent Labs  01/31/13 1955 02/01/13 0545  NA 135 139  K 5.2* 5.1  CL 105 109  CO2 21 21  GLUCOSE 127* 86  BUN 54* 51*  CREATININE 2.14* 2.03*  CALCIUM 8.3* 8.3*    PT/INR:  Recent Labs  02/01/13 0545  LABPROT 16.9*  INR 1.41     Radiology Dg Chest Port 1 View  01/30/2013   CLINICAL DATA:  Status post left-sided chest tube insertion.  EXAM: PORTABLE CHEST - 1 VIEW  COMPARISON:  CT scan of chest dated January 29, 2013.  FINDINGS: A large caliber chest tube is been placed on the left with the tip of the tube to lie between the posterior aspects of the left 8 and 9th ribs medially. There has been interval improvement in the opacity of the left mid and lower hemithorax consistent with drainage of pleural fluid. There is pleural space air now associated with residual fluid and thickened pleura. There is no mediastinal shift.  The cardiac silhouette is top-normal in size. The pulmonary vascularity is not engorged. There is tortuosity of the descending thoracic aorta.  IMPRESSION: The patient has undergone interval drainage of a large amount of of fluid from the left pleural space by placement of a large caliber  chest tube. There is residual soft tissue density present and there is pleural space air visible in the lower hemithorax. No apical pneumothorax is demonstrated.   Electronically Signed   By: David  [place]   On: 01/30/2013 16:47     Assessment/Plan: Refuses   Procedure(s) (LRB):                   VIDEO BRONCHOSCOPY (N/A)                VIDEO ASSISTED THORACOSCOPY (VATS)/DECORTICATION (Left)                 PLEURAL BIOPSY (N/A) Cancel surgery Continued medical treatment   Delight Ovens MD 02/01/2013 7:47 AM

## 2013-02-01 NOTE — Progress Notes (Signed)
  Echocardiogram 2D Echocardiogram has been performed.  [name] 02/01/2013, 12:04 PM

## 2013-02-01 NOTE — Progress Notes (Signed)
TELEMETRY: Reviewed telemetry pt in afib with RVR, sustained rate 120-130.: Filed Vitals:   01/31/13 0454 01/31/13 1401 01/31/13 2039 02/01/13 0515  BP: 98/59 98/59 98/55  97/57  Pulse: 97 90 89 87  Temp: 97.7 F (36.5 C) 98.2 F (36.8 C) 97.8 F (36.6 C) 98.2 F (36.8 C)  TempSrc: Oral Oral Oral Oral  Resp: 18 18 18 18   Height: 6' (1.829 m)     Weight: 125 lb 3.5 oz (56.8 kg)   125 lb 3.5 oz (56.8 kg)  SpO2: 96% 97% 99% 96%    Intake/Output Summary (Last 24 hours) at 02/01/13 0803 Last data filed at 02/01/13 0700  Gross per 24 hour  Intake    960 ml  Output   1170 ml  Net   -210 ml    SUBJECTIVE Breathing is  better. Some cough. No chest pain or orthopnea. Denies any palpitations. Patient refusing surgery.  LABS: Basic Metabolic Panel:  Recent Labs  16/10/96 1955 02/01/13 0545  NA 135 139  K 5.2* 5.1  CL 105 109  CO2 21 21  GLUCOSE 127* 86  BUN 54* 51*  CREATININE 2.14* 2.03*  CALCIUM 8.3* 8.3*   CBC:  Recent Labs  01/29/13 1050  01/31/13 1955 02/01/13 0545  WBC 7.1  < > 6.5 5.4  NEUTROABS 6.0  --   --   --   HGB 9.0*  < > 9.4* 9.1*  HCT 26.5*  < > 29.3* 28.5*  MCV 83.6  < > 85.4 84.6  PLT 290  < > 267 281  < > = values in this interval not displayed. Cardiac Enzymes:  Recent Labs  01/29/13 1910 01/30/13 0200  TROPONINI <0.30 <0.30   BNP: 11562  Anemia Panel:  Recent Labs  01/30/13 0200  VITAMINB12 730  FOLATE 13.4  FERRITIN 406*  TIBC 140*  IRON 15*  RETICCTPCT 1.5    Radiology/Studies:  Dg Chest 2 View  01/29/2013   CLINICAL DATA:  Shortness of breath, productive cough, weakness, and weight loss. There is history of coronary artery disease, CHF, and chronic renal failure.  EXAM: CHEST  2 VIEW  COMPARISON:  April 15, 2012.  FINDINGS: There is volume loss on the left likely secondary to a pleural effusion. On the right the lung is well-expanded and clear. The cardiac silhouette is top-normal in size. The pulmonary vascularity  is not engorged. There is tortuosity of the descending thoracic aorta. There is curvature of the thoracic spine with the convexity towards the right.  IMPRESSION: There has been interval increase in the size of the left pleural effusion. No significant effusion on the right is demonstrated today.   Electronically Signed   By: David  [place]   On: 01/29/2013 11:21   Ct Chest Wo Contrast  01/29/2013   CLINICAL DATA:  Increasing dyspnea.  Productive cough.  EXAM: CT CHEST WITHOUT CONTRAST  TECHNIQUE: Multidetector CT imaging of the chest was performed following the standard protocol without IV contrast.  COMPARISON:  Chest radiographs obtained earlier today and chest CT dated 03/27/2006.  FINDINGS: Interval moderate to large-sized loculated pleural effusion in the left mid and lower lung zones. There is also a suggestion of a faintly visible pleural based mass inferiorly and medially, measuring 2.5 x 1.9 cm on image number 59. There is also a probable 2nd pleural based mass medially and inferiorly, measuring 2.8 x 1.5 cm on image number 64. Visualization of these masses is somewhat limited due to the lack of intravenous  contrast. There is compressive atelectasis and some parenchymal calcification medial to the pleural fluid as well as deviation of the heart to the right. There is also patchy airspace opacity in the adjacent superior segment of the left lower lobe.  Dense proximal coronary artery calcifications are noted. Also noted is mild right lower lobe atelectasis. No enlarged lymph nodes are seen. Again demonstrated are bilateral renal cysts. These include extensive bilateral parapelvic cysts and possible chronic hydronephrosis. There are also small gallstones in the gallbladder, the largest measuring 3 mm in maximum diameter. Degenerative changes in the thoracic and cervical spine with changes of DISH. Mild to moderate dextro convex scoliosis.  IMPRESSION: 1. Moderate to large-sized loculated left pleural  fluid with possible pleural-based masses. These findings are concerning for a loculated malignant pleural effusion. Further evaluation with thoracentesis is recommended. The loculated pleural fluid is causing deviation of the heart to the right. 2. Probable pneumonia in the superior segment of the left lower lobe. 3. Bilateral lower lobe atelectasis, greater on the left. 4. Dense proximal coronary artery calcifications. 5. Cholelithiasis. 6. Chronic bilateral renal parapelvic cysts and possible UPJ obstructions.   Electronically Signed   By: Gordan Payment M.D.   On: 01/29/2013 23:08   Dg Chest Left Decubitus  01/29/2013   CLINICAL DATA:  Evaluate for layering effusion  EXAM: CHEST - LEFT DECUBITUS  COMPARISON:  Prior chest x-ray 01/29/2013  FINDINGS: No evidence of layering pleural of fluid. Otherwise, no significant interval change in the appearance of the chest compared to earlier today.  IMPRESSION: No layering fluid. The left basilar opacity may reflect a loculated pleural effusion, dense consolidation, or potentially an underlying mass. Recommend further evaluation with contrasted CT scan of the chest.   Electronically Signed   By: Malachy Moan M.D.   On: 01/29/2013 15:47   US Renal  01/29/2013   CLINICAL DATA:  Followup hydronephrosis  EXAM: RENAL/URINARY TRACT ULTRASOUND COMPLETE  COMPARISON:  04/16/2012  FINDINGS: Right Kidney  Length: 10.4 cm. Echogenicity is within normal limits. There is been significant improvement in right hydronephrosis with residual wall pelvocaliectasis. Cyst within the inferior pole measures 10 x 7 x 8 mm.  Left Kidney  Length: Measures 10.7 cm. Echogenicity is within normal limits. There is moderate left hydronephrosis. This is improved when compared with the previous exam. Cyst within the upper pole measures 1.4 x 1.7 x 1.5 cm.  Bladder  Appears normal for degree of bladder distention.  Other:   The prostate gland is enlarged measuring 5 x 4.8 x 5.8 cm  IMPRESSION: 1.  Interval improvement in severe bilateral hydronephrosis. There is residual right pelvocaliectasis and moderate left hydronephrosis. 2. Bilateral renal cysts. 3. Prostate gland enlargement.   Electronically Signed   By: Signa Kell M.D.   On: 01/29/2013 17:06    PHYSICAL EXAM General: Well developed, thin, in no acute distress. Head: Normal Neck: Negative for carotid bruits. JVD not elevated. Lungs: Diminished BS left base. Chest tube in place. Heart: IRRR S1 S2 without murmurs, rubs, or gallops.  Abdomen: Soft, non-tender, non-distended with normoactive bowel sounds. No hepatomegaly.  Msk:  Strength and tone appears normal for age. Extremities: No clubbing, cyanosis or edema.  Distal pedal pulses are 2+ and equal bilaterally. Neuro: Alert and oriented X 3. Moves all extremities spontaneously. Psych:  Responds to questions appropriately with a normal affect.  ASSESSMENT AND PLAN: 1. Chronic systolic CHF- appears well compensated. Agree with holding diuretics now. Not a candidate for ACEi/ ARB/aldactone  due to CKD. Patient has refused more aggressive Rx with beta blocker/nitrates/hydralazine in past. Does not want cardiac cath or consideration for ICD.  2. Left pleural effusion. Thoracentesis consistent with empyema. Chest tube now in place. On IV antibiotics. CT surgery consult noted. Recommended VATS but patient refuses this am.  3. CKD stage III 4. Moderate MR, mild-moderate AI 5. NSVT-asymptomatic 6. Remote history of non- Hodgkins lymphoma. 7. Paroxysmal AFib/flutter. Likely related to instrumentation with chest tube and empyema. Now sustained with RVR. Patient asymptomatic. BP soft. Will start low dose metoprolol. Will start amiodarone. Would normally anticoagulate but with chest tube in place will defer for now.   Mr Walsh has consistently refused more aggressive medical and surgical treatments. This has limited our ability to help him. I don't think he is going to do very well.       Principal Problem:   Pleural effusion, left Active Problems:   Aortic insufficiency   CKD (chronic kidney disease), stage III   Acute systolic CHF (congestive heart failure)   Bilateral hydronephrosis   Cardiomyopathy, ischemic   CAD (coronary artery disease)   Pneumonia   Empyema   Paroxysmal a-fib   Acute respiratory failure   Trapped lung   Protein-calorie malnutrition, severe    Signed, Steffanie Mingle [place] MD,FACC 02/01/2013 8:03 AM

## 2013-02-02 ENCOUNTER — Inpatient Hospital Stay (HOSPITAL_COMMUNITY): Payer: Medicare Other

## 2013-02-02 DIAGNOSIS — E43 Unspecified severe protein-calorie malnutrition: Secondary | ICD-10-CM

## 2013-02-02 DIAGNOSIS — I359 Nonrheumatic aortic valve disorder, unspecified: Secondary | ICD-10-CM

## 2013-02-02 DIAGNOSIS — I4891 Unspecified atrial fibrillation: Secondary | ICD-10-CM

## 2013-02-02 DIAGNOSIS — Z515 Encounter for palliative care: Secondary | ICD-10-CM

## 2013-02-02 HISTORY — DX: Unspecified atrial fibrillation: I48.91

## 2013-02-02 MED ORDER — FUROSEMIDE 40 MG PO TABS
40.0000 mg | ORAL_TABLET | Freq: Every day | ORAL | Status: DC | PRN
  Filled 2013-02-02: qty 1

## 2013-02-02 NOTE — Progress Notes (Signed)
[facility] PROGRESS NOTE Interim History: 78 y.o. male with PMH significant for non ischemic cardiomyopathy, systolic heart failure EF 15 % by ECHO 03-2012, CKD stage III, Cr baseline 2.4, chronic hydronephrosis, Non Hodgkin lymphoma, who presents to the ED complaining of progressive SOB on exertion for last week. He relates dyspnea on exertion, he has to stop exercising in the treadmill due to dyspnea. He usually exercise for 40 minutes daily. He denies chest pain, nausea, vomiting. He is only taking 10 mg of lasix. Came in with SOB, CXR and CT chest done showed left sided effusion. Thoracentesis done showing purulent material (PCCM).   Filed Weights   01/31/13 0454 02/01/13 0515 02/02/13 0410  Weight: 56.8 kg (125 lb 3.5 oz) 56.8 kg (125 lb 3.5 oz) 57.3 kg (126 lb 5.2 oz)        Intake/Output Summary (Last 24 hours) at 02/02/13 4782 Last data filed at 02/02/13 9562  Gross per 24 hour  Intake    720 ml  Output   1320 ml  Net   -600 ml     Assessment/Plan: Acute respiratory failure/Pneumonia/Pleural effusion, left/  Empyema: - Pulmonary consult, thoracocentesis 11.10.2014 Started empirically on vanc and rocephin 11.12.2014.  - He has refused VATS, d/w pulmonary recommended to Increase suction. - Afebrile, daily CXR. - since has refused several interventions, has refused aggressive intervention, with beta blocker/nitrates/hydralazine in past. Does not want cardiac cath or consideration for ICD. Will consult PMT. - Culture gram positive cocci in cluster.  Paroxysmal a-fib: - likely relate to procedure - Agree with cards cont to monitor. - now in Sinus rhythm.   Hyperkalemia: - resolved.  Chronic systolic CHF (congestive heart failure): - Euvolemic. - Agree with holding diuretics now. Not a candidate for ACEi/ ARB/aldactone due to CKD. Patient has refused more aggressive Rx with beta blocker/nitrates/hydralazine in past. Does not want cardiac cath or consideration for ICD.   - Resume lasix.  CKD (chronic kidney disease), stage III - Cr baseline 2.4. He has prior history of bilateral hydronephrosis. He never follow up with urology. Repeated renal US.Interval improvement in severe bilateral hydronephrosis - start flomax.   Severe protein caloric mal-nutritionL: -  Ensure TID.  Code Status: Full  Family Communication: none  Disposition Plan: inpatient  Consultants:  Pulmonology  Cardiology Procedures:  none Antibiotics Vancomycin 11/12 >>  rocephin 11/12 >>      HPI/Subjective: No complains  Objective: Filed Vitals:   02/01/13 1447 02/01/13 2033 02/01/13 2229 02/02/13 0410  BP: 90/57 92/57 91/49  98/63  Pulse: 85 103 105 100  Temp: 97.6 F (36.4 C) 98.2 F (36.8 C)  97.7 F (36.5 C)  TempSrc: Oral Oral  Oral  Resp: 17 18 18 18   Height:      Weight:    57.3 kg (126 lb 5.2 oz)  SpO2: 100% 100%  100%     Exam:  General: Alert, awake, oriented x3, in no acute distress.  HEENT: No bruits, no goiter.  Heart: Regular rate and rhythm,  Lungs: moderate air movement, decrease sounds on left. Abdomen: Soft, nontender, nondistended, positive bowel sounds.  Neuro: Grossly intact, nonfocal.   Data Reviewed: Basic Metabolic Panel:  Recent Labs Lab 01/30/13 0200 01/31/13 0518 01/31/13 1012 01/31/13 1955 02/01/13 0545  NA 137 136 135 135 139  K 4.9 6.2* 5.8* 5.2* 5.1  CL 106 108 107 105 109  CO2 17* 16* 19 21 21   GLUCOSE 200* 98 138* 127* 86  BUN 67* 60* 57* 54* 51*  CREATININE 2.33* 2.12* 1.96* 2.14* 2.03*  CALCIUM 8.4 8.8 8.5 8.3* 8.3*   Liver Function Tests:  Recent Labs Lab 01/30/13 1715 01/31/13 1955 02/01/13 0545  AST  --  30 18  ALT  --  17 15  ALKPHOS  --  70 68  BILITOT  --  0.2* 0.3  PROT 7.1 6.7 6.6  ALBUMIN  --  1.7* 1.7*   No results found for this basename: LIPASE, AMYLASE,  in the last 168 hours No results found for this basename: AMMONIA,  in the last 168 hours CBC:  Recent Labs Lab 01/29/13 1050  01/29/13 1105 01/30/13 0200 01/31/13 0518 01/31/13 1955 02/01/13 0545  WBC 7.1  --  6.9 7.9 6.5 5.4  NEUTROABS 6.0  --   --   --   --   --   HGB 9.0* 9.9* 8.8* 10.2* 9.4* 9.1*  HCT 26.5* 29.0* 26.1* 31.6* 29.3* 28.5*  MCV 83.6  --  83.1 85.4 85.4 84.6  PLT 290  --  288 316 267 281   Cardiac Enzymes:  Recent Labs Lab 01/29/13 1910 01/30/13 0200  TROPONINI <0.30 <0.30   BNP (last 3 results)  Recent Labs  04/12/12 1224 01/29/13 1050  PROBNP 18809.0* 11562.0*   CBG: No results found for this basename: GLUCAP,  in the last 168 hours  Recent Results (from the past 240 hour(s))  BODY FLUID CULTURE     Status: None   Collection Time    01/30/13  3:37 PM      Result Value Range Status   Specimen Description PLEURAL FLUID LEFT   Final   Special Requests 6.OML FLUID   Final   Gram Stain     Final   Value: ABUNDANT WBC PRESENT,BOTH PMN AND MONONUCLEAR     ABUNDANT GRAM POSITIVE COCCI IN CLUSTERS     Gram Stain Report Called to,Read Back By and Verified With: Gram Stain Report Called to,Read Back By and Verified With:  JAMES A @813  ON 01/31/13 BY SMIAS     Performed at [facility]     Final   Value: Culture reincubated for better growth     Performed at [facility]   Report Status PENDING   Incomplete  SURGICAL PCR SCREEN     Status: None   Collection Time    01/31/13  8:16 PM      Result Value Range Status   MRSA, PCR NEGATIVE  NEGATIVE Final   Staphylococcus aureus NEGATIVE  NEGATIVE Final   Comment:            The Xpert SA Assay (FDA     approved for NASAL specimens     in patients over 40 years of age),     is one component of     a comprehensive surveillance     program.  Test performance has     been validated by [employer] for patients greater     than or equal to 44 year old.     It is not intended     to diagnose infection nor to     guide or monitor treatment.     Studies: Dg Chest Port 1 View  02/01/2013   CLINICAL  DATA:  Respiratory failure. Left pleural fluid chest tube drainage.  EXAM: PORTABLE CHEST - 1 VIEW  COMPARISON:  01/30/2013.  CT chest 01/29/2013.  FINDINGS: Large bore chest tube is noted in stable position  of the left lower chest. Previously identified left base pleural fluid and air collection remains. The air collection is diminished in size from prior exam. Mild left perihilar atelectasis and infiltrate noted on today's exam. Right lung is clear. Cardiomegaly. No pulmonary venous congestion.  IMPRESSION: 1. Large bore chest tube is noted over left lower pleural space in stable position. The left base pleural fluid and air collection remain. Air in the left pleural cavity has diminished on today's exam. 2. Mild left perihilar infiltrate.   Electronically Signed   By: Maisie Fus  Register   On: 02/01/2013 07:48    Scheduled Meds: . amiodarone  400 mg Oral BID  . cefTRIAXone (ROCEPHIN)  IV  2 g Intravenous Q24H  . cefUROXime (ZINACEF)  IV  1.5 g Intravenous 60 min Pre-Op  . feeding supplement (ENSURE COMPLETE)  237 mL Oral QPC supper  . metoprolol tartrate  12.5 mg Oral BID  . tamsulosin  0.4 mg Oral QPC supper  . vancomycin  500 mg Intravenous Q24H   Continuous Infusions:    Marinda Elk  [facility] Pager [phone]. If 8PM-8AM, please contact night-coverage at [email or web address], password [facility] 02/02/2013, 7:43 AM  LOS: 4 days

## 2013-02-02 NOTE — Consult Note (Addendum)
[name]      DOB: 07-28-1934      AVW:[id number]     Consult Note from the Palliative Medicine Team at [facility]    Consult Requested by: Dr. Renee [name]     PCP: [name] Peck, MD Reason for Consultation: Goals of care    Phone [phone] Related symptom recommendations Assessment of patients Current state: Patient is a 78 year old African American male with a known past medical history for cardiomyopathy, paroxysmal A. fib/flutter, non-Hodgkin's lymphoma, patient has refused medical management of his cardiac condition the past including cardiac catheterization and ICD placement. He reports to me spontaneously today that he has told his family that he would not want to be on machines, nor have extensive procedures if he were to die. He subsequently stated he could try if she wanted to. We talked about that and how a decision that was more firm would be needed otherwise the physicians would institute CPR and ventilatory support at his time of death. He therefore stated that he would prefer not to undergo CPR or life support. His major goal and concern as to maintain his independence. His lives alone and cook for himself for a long time. He does have a brother and a daughter with him he states he is discussing his medical care. He would not permit me to update them on his current condition or in his goals of care. He told me that he would tell me when he wants me to call them to tell him things about his care. The patient states at this time that his goals are to see how the conservative treatment of his empyema work which he stated included the chest tube that is present in the antibiotics. He again reiterates that and got hands. When I challenged him to be more specific about how God would use other people to take care of him he said he would need to think through whether or not to have surgery if this was not going to work. I explained the importance of fat and  cleaning out the area said that his lung to get better which would then allow him to improve which is his chief goals of care to maintain his independence. He stated he would permit me to return to help him think through these processes as more information became available.  Goals of Care: 1.  Code Status: Patient desires to change his CODE STATUS to DO NOT RESUSCITATE with the understanding that we will continue to try to treat his conditions for your. I asked if the patient had a family member who would act on his behalf if he were to become too sick to make decisions for himself and he stated his brother in conjunction with his daughter and grandson would be the people that he would pick. I encouraged him to let me send an advanced directives packet to his room so that we could complete that when his family was available. And I encouraged them to let us discuss his care with them so they would know what his wishes would be in an emergency.   2. Scope of Treatment: Patient desires to continued curative treatment for his empyema. At this time he would like to maintain conservative therapy with the chest tube in antibiotics. He stated he would entertain surgery if that was not effective in treating his current condition. I re explained the process of fat emboli is important for long-term healing. The care team should read explain  the process each time so that he understands and can make choices for himself which is an important part of the decision making process for him-be independent.  4. Disposition: To be determined. The patient lives at home alone. He is not willing to hypostatic we reviewed the options for himself at this time   3. Symptom Management:    1. [name]: Continue as needed hydrocodone. At this time the patient states that his [name] is under control. 2. Empyema continue chest tube reexpansion and antibiotic therapy 3. Paroxysmal atrial fibrillation continue amiodarone and  Lasix 4. Malnutrition continue supplementation  4 . Psychosocial: Patient reports that he is left alone and cook for himself for a long time. He commented on the disappointing food at the hospital. He is a man of strong Faith who puts his decision-making process in hands of God. He used to work for a [employer] prior to retirement. He now states he just tries to keep busy.  5. 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 He states "I can prefer myself and he has had visitors from his church according to him.        Patient Documents Completed or Given: Document Given Completed  Advanced Directives Pkt    MOST    DNR    Gone from My Sight    Hard Choices      Brief HPI: Patient is a 67 African American male who appears to have the ability to make decisions for himself. He refuses to permit his family members to participate in goals of care. He was admitted to the hospital with increasing shortness of breath and found to have a left empyema which is now being treated with antibiotic and chest tube suctioning. The patient has "refused VATS". We are asked to assist with goals of care   ROS: He denies [name] nausea vomiting diarrhea states the sleeping well and has had a bowel movement   PMH:  Past Medical History  Diagnosis Date  . Aortic insufficiency     a. Previously mod-severe;  b. 03/2012 Echo: Mild to Mod AI  . Atrial flutter     resolved  . Anemia   . PVCs (premature ventricular contractions)   . Chronic systolic CHF (congestive heart failure)     a.  03/2012 Echo: EF 15%, Mild to Mod AI, Mod MR  . Bilateral hydronephrosis     a. 03/2012 Renal u/s: Bilat Severe hydronephrosis, medical renal dzs.  . CKD (chronic kidney disease), stage III   . Non Hodgkin's lymphoma     resolved >10 yr ago  . Moderate mitral regurgitation     a.  03/2012 Echo: Mod MR  . BPH (benign prostatic hyperplasia)     a. noted on renal u/s  03/2012.  . Ischemic cardiomyopathy     a.  Lexiscan Myoview 04/24/12: Inferior apical MI, study not gated due to frequent PVCs, triplets => patient refuses cath  . Gout      PSH: Past Surgical History  Procedure Laterality Date  . Lymph node dissection      Groin  . Inguinal hernia repair     I have reviewed the FH and SH and  If appropriate update it with new information. Allergies  Allergen Reactions  . Contrast Media [Iodinated Diagnostic Agents] Other (See Comments)    Patient does not want dye because it is hard on his kidneys  . Prednisone Rash  . Shellfish Allergy  Itching and Rash    Crab meat   Scheduled Meds: . amiodarone  400 mg Oral BID  . cefTRIAXone (ROCEPHIN)  IV  2 g Intravenous Q24H  . feeding supplement (ENSURE COMPLETE)  237 mL Oral QPC supper  . metoprolol tartrate  12.5 mg Oral BID  . tamsulosin  0.4 mg Oral QPC supper  . vancomycin  500 mg Intravenous Q24H   Continuous Infusions:  PRN Meds:.acetaminophen, acetaminophen, furosemide, HYDROcodone-acetaminophen, nitroGLYCERIN    BP 97/55  Pulse 70  Temp(Src) 97.7 F (36.5 C) (Oral)  Resp 20  Ht 6' (1.829 m)  Wt 57.3 kg (126 lb 5.2 oz)  BMI 17.13 kg/m2  SpO2 100%   PPS: Unclear, patient reports eating likely not ambulating because of his chest tube. The was up and about 50% or greater prior to admission   Intake/Output Summary (Last 24 hours) at 02/02/13 1534 Last data filed at 02/02/13 1247  Gross per 24 hour  Intake    960 ml  Output   1320 ml  Net   -360 ml   LBM: 02/01/2013                      Physical Exam:  General: Flat affect, but pleasant HEENT:  Pupils equal round and reactive to light extraocular muscles appear to be intact mucous membranes are dry Chest:   Decreased bilaterally, poor air entry, left-sided chest tube with plenty output CVS: regular rate and rhythm positive S1 positive S2,  Abdomen:Scaphoid soft nontender non-distended Ext: Wasted with evidence for  malnutrition Neuro: Patient appears to have capacity for decision making he understands the procedures that have been requested although he states that God is ultimately in control of his health. His Faith is obviously the first basis from which he makes his decisions.  Labs: CBC    Component Value Date/Time   WBC 5.4 02/01/2013 0545   WBC 2.3* 04/26/2006 1003   RBC 3.37* 02/01/2013 0545   RBC 3.14* 01/30/2013 0200   RBC 3.27* 04/26/2006 1003   HGB 9.1* 02/01/2013 0545   HGB 9.9* 04/26/2006 1003   HCT 28.5* 02/01/2013 0545   HCT 29.4* 04/26/2006 1003   PLT 281 02/01/2013 0545   PLT 114* 04/26/2006 1003   MCV 84.6 02/01/2013 0545   MCV 89.7 04/26/2006 1003   MCH 27.0 02/01/2013 0545   MCH 30.2 04/26/2006 1003   MCHC 31.9 02/01/2013 0545   MCHC 33.7 04/26/2006 1003   RDW 15.6* 02/01/2013 0545   RDW 15.7* 04/26/2006 1003   LYMPHSABS 0.7 01/29/2013 1050   LYMPHSABS 0.5* 04/26/2006 1003   MONOABS 0.3 01/29/2013 1050   MONOABS 0.2 04/26/2006 1003   EOSABS 0.0 01/29/2013 1050   EOSABS 0.0 04/26/2006 1003   BASOSABS 0.0 01/29/2013 1050   BASOSABS 0.0 04/26/2006 1003       CMP     Component Value Date/Time   NA 139 02/01/2013 0545   K 5.1 02/01/2013 0545   CL 109 02/01/2013 0545   CO2 21 02/01/2013 0545   GLUCOSE 86 02/01/2013 0545   BUN 51* 02/01/2013 0545   CREATININE 2.03* 02/01/2013 0545   CALCIUM 8.3* 02/01/2013 0545   PROT 6.6 02/01/2013 0545   ALBUMIN 1.7* 02/01/2013 0545   AST 18 02/01/2013 0545   ALT 15 02/01/2013 0545   ALKPHOS 68 02/01/2013 0545   BILITOT 0.3 02/01/2013 0545   GFRNONAA 30* 02/01/2013 0545   GFRAA 35* 02/01/2013 0545    Chest Xray Reviewed/Impressions: 1.  Continued decrease and left-sided hydro pneumothorax.  2. Continued soft tissue density in the left lower lobe compatible  with airspace disease or mass lesion.  3. Persistent cardiomegaly without failure.   CT scan of the chest Reviewed/Impressions:  1. Moderate to large-sized loculated left pleural  fluid with  possible pleural-based masses. These findings are concerning for a  loculated malignant pleural effusion. Further evaluation with  thoracentesis is recommended. The loculated pleural fluid is causing  deviation of the heart to the right.  2. Probable pneumonia in the superior segment of the left lower  lobe.  3. Bilateral lower lobe atelectasis, greater on the left.  4. Dense proximal coronary artery calcifications.  5. Cholelithiasis.  6. Chronic bilateral renal parapelvic cysts and possible UPJ  obstructions     Time In Time Out Total Time Spent with Patient Total Overall Time  245 pm 330 pm 40 min 45 min    Greater than 50%  of this time was spent counseling and coordinating care related to the above assessment and plan.   Aissata Wilmore L. Ladona Ridgel, MD MBA The Palliative Medicine Team at [facility] Phone: [phone] Pager: [phone]

## 2013-02-02 NOTE — Progress Notes (Signed)
PULMONARY  / CRITICAL CARE MEDICINE  Name: [name] MRN: [id number] DOB: 12/12/1934    ADMISSION DATE:  01/29/2013 CONSULTATION DATE:  01/30/13   REFERRING MD :  [facility] PRIMARY SERVICE:  [facility] Primary MD:  Zenaida Deed  CHIEF COMPLAINT:  SOB  BRIEF PATIENT DESCRIPTION: 78 y.o. With PMH of CHF, CKD, ischemic cardiomyopathy, and h/o Non hodgkins lymphoma admitted by [facility] 11/11 for 1 week history of worsening SOB and productive cough.  CT scan of chest w/o contrast showed large left pleural effusion w/ possible loculation and two poorly visualized masses.    SIGNIFICANT EVENTS / STUDIES:  11/11 Admitted shortness of breath 11/11 CT chest w/o contrast >> Large left pleural effusion, possible loculations and right sided deviation of the heart, difficult to visualize masses likely pleural based 11/12 Left Thoracentesis >>> 1L pus 11/12 CT placed  11/13 [facility] consult for surgical drainage and mgmt of trapped lung. Pt refused VATS 11/14 Some improvement/ re-expansion of LLL 11/15 CXR stable resid L effus, atx, tube ok  TUBES/LINES: 11/12 L Chest tube >>   CULTURES: 11/12 pleural fluid >> abundant GPC clusters >>   ANTIBIOTICS: Zosyn 11/12>> 11/13 Vancomycin 11/12 >>  Ceftriaxone 11/13 >>   SUBJECTIVE:  No new complaints  VITAL SIGNS: Temp:  [97.6 F (36.4 C)-98.2 F (36.8 C)] 97.7 F (36.5 C) (11/15 0410) Pulse Rate:  [85-105] 100 (11/15 0410) Resp:  [17-18] 18 (11/15 0410) BP: (90-98)/(49-63) 98/63 mmHg (11/15 0410) SpO2:  [100 %] 100 % (11/15 0410) Weight:  [126 lb 5.2 oz (57.3 kg)] 126 lb 5.2 oz (57.3 kg) (11/15 0410)  PHYSICAL EXAMINATION: General:  Thin cachectic, weak Neuro: No focal deficits HEENT: WNL Cardiovascular:  RRR gr 1/6 diast m at LSB  Lungs: clear anteriorly, decr BS on left, L chest tube in position Abdomen:  +BS, soft, non-tender Ext: No edema   I have reviewed all of today's lab results. Relevant abnormalities are discussed in the A/P  section  CXR 11/15>  No change  CXR 11/14>  IMPRESSION:  1. Large bore chest tube is noted over left lower pleural space in stable position. The left base pleural fluid and air collection remain. Air in the left pleural cavity has diminished on today's exam.  2. Mild left perihilar infiltrate.    ASSESSMENT: 1) L empyema - staph organism by GS, C&S still pending... Trapped LLL  REC -  Pt refused VATS Cont Vanc and ceftriaxone Continue suction on chest tube Follow daily chest Xrays  2) Severe ischemic cardiomyopathy w/ EF=20% on recent 2DEcho (notes from drJordan indicate that he has refused cath as well); he missed f/u appts. Rec-  Cardiac & CHF management per [facility]  3) Chronic renal ionsuffic w/ Cr ~2 & stable, he has seen DrWebb in past for nephrology...   Lonzo Cloud. Kriste Basque, MD [facility] 02/02/13 @ 7:56AM

## 2013-02-02 NOTE — Progress Notes (Signed)
ANTIBIOTIC CONSULT NOTE - follow up Pharmacy Consult for Vancomycin Indication: L empyema  Allergies  Allergen Reactions  . Contrast Media [Iodinated Diagnostic Agents] Other (See Comments)    Patient does not want dye because it is hard on his kidneys  . Prednisone Rash  . Shellfish Allergy Itching and Rash    Crab meat    Patient Measurements: Height: 6' (182.9 cm) Weight: 126 lb 5.2 oz (57.3 kg) IBW/kg (Calculated) : 77.6  Vital Signs: Temp: 97.7 F (36.5 C) (11/15 1100) Temp src: Oral (11/15 1100) BP: 97/55 mmHg (11/15 1100) Pulse Rate: 70 (11/15 1100) Intake/Output from previous day: 11/14 0701 - 11/15 0700 In: 720 [P.O.:720] Out: 1320 [Urine:1050; Chest Tube:270] Intake/Output from this shift: Total I/O In: 720 [P.O.:720] Out: -   Labs:  Recent Labs  01/31/13 0518 01/31/13 1012 01/31/13 1955 02/01/13 0545  WBC 7.9  --  6.5 5.4  HGB 10.2*  --  9.4* 9.1*  PLT 316  --  267 281  CREATININE 2.12* 1.96* 2.14* 2.03*   Estimated Creatinine Clearance: 25.1 ml/min (by C-G formula based on Cr of 2.03). No results found for this basename: VANCOTROUGH, Leodis Binet, VANCORANDOM, GENTTROUGH, GENTPEAK, GENTRANDOM, TOBRATROUGH, TOBRAPEAK, TOBRARND, AMIKACINPEAK, AMIKACINTROU, AMIKACIN,  in the last 72 hours   Microbiology: Recent Results (from the past 720 hour(s))  BODY FLUID CULTURE     Status: None   Collection Time    01/30/13  3:37 PM      Result Value Range Status   Specimen Description PLEURAL FLUID LEFT   Final   Special Requests 6.OML FLUID   Final   Gram Stain     Final   Value: ABUNDANT WBC PRESENT,BOTH PMN AND MONONUCLEAR     ABUNDANT GRAM POSITIVE COCCI IN CLUSTERS     Gram Stain Report Called to,Read Back By and Verified With: Gram Stain Report Called to,Read Back By and Verified With:  JAMES A @813  ON 01/31/13 BY SMIAS     Performed at [facility]     Final   Value: Culture reincubated for better growth     Performed at [facility]   Report Status PENDING   Incomplete  SURGICAL PCR SCREEN     Status: None   Collection Time    01/31/13  8:16 PM      Result Value Range Status   MRSA, PCR NEGATIVE  NEGATIVE Final   Staphylococcus aureus NEGATIVE  NEGATIVE Final   Comment:            The Xpert SA Assay (FDA     approved for NASAL specimens     in patients over 8 years of age),     is one component of     a comprehensive surveillance     program.  Test performance has     been validated by [employer] for patients greater     than or equal to 52 year old.     It is not intended     to diagnose infection nor to     guide or monitor treatment.     Assessment: 78 yr old male admitted with 1 week history of worsening SOB and productive cough. CT showed large pleural effusion. He underwent a thoracentesis and has been started on empiric antibiotics.  He is on day # 3 of vancomycin. S/p thoracentesis 11/12 with 1 L pus drained and CT placed. Last WBC 5.4, afebrile, pleural fluid  cx with staph - sensitivities pending. Pt declined VATS procedure.  Zosyn 11/12>>11/13 Vancomycin 11/12>> CTX 11/13>>  11/12 Pleural fluid cx - GPC in clusters   Goal of Therapy:  Vanc trough 15-20  Plan:  1. Continue vancomycin 500 mg IV q24, also on ceftriaxone 2 gm IV q24 per MD 2. F/u ID and sensitivities of  Gram positive cocci in clusters in 11/12 pleural fluid cx.  If MRSA, check vancomycin trough.  If MSSA, change abx to ancef. Herby Abraham, Pharm.D. [phone] 02/02/2013 2:37 PM

## 2013-02-02 NOTE — Progress Notes (Signed)
SUBJECTIVE  Echo yesterday EF 20% (stable) moderate AI.   Back in SR. HR 80s-100 on tele. Frequent PACs.  Denies any complaints. No CP or SOB. Says he feels good. Refusing VATS.   Filed Vitals:   02/01/13 2033 02/01/13 2229 02/02/13 0410 02/02/13 0900  BP: 92/57 91/49 98/63  94/59  Pulse: 103 105 100 106  Temp: 98.2 F (36.8 C)  97.7 F (36.5 C) 97.5 F (36.4 C)  TempSrc: Oral  Oral Oral  Resp: 18 18 18 18   Height:      Weight:   57.3 kg (126 lb 5.2 oz)   SpO2: 100%  100% 99%    Intake/Output Summary (Last 24 hours) at 02/02/13 1213 Last data filed at 02/02/13 0811  Gross per 24 hour  Intake    840 ml  Output   1320 ml  Net   -480 ml     LABS: Basic Metabolic Panel:  Recent Labs  11/91/47 1955 02/01/13 0545  NA 135 139  K 5.2* 5.1  CL 105 109  CO2 21 21  GLUCOSE 127* 86  BUN 54* 51*  CREATININE 2.14* 2.03*  CALCIUM 8.3* 8.3*   CBC:  Recent Labs  01/31/13 1955 02/01/13 0545  WBC 6.5 5.4  HGB 9.4* 9.1*  HCT 29.3* 28.5*  MCV 85.4 84.6  PLT 267 281   Cardiac Enzymes: No results found for this basename: CKTOTAL, CKMB, CKMBINDEX, TROPONINI,  in the last 72 hours BNP: 11562  Anemia Panel: No results found for this basename: VITAMINB12, FOLATE, FERRITIN, TIBC, IRON, RETICCTPCT,  in the last 72 hours  Radiology/Studies:  Dg Chest 2 View  01/29/2013   CLINICAL DATA:  Shortness of breath, productive cough, weakness, and weight loss. There is history of coronary artery disease, CHF, and chronic renal failure.  EXAM: CHEST  2 VIEW  COMPARISON:  April 15, 2012.  FINDINGS: There is volume loss on the left likely secondary to a pleural effusion. On the right the lung is well-expanded and clear. The cardiac silhouette is top-normal in size. The pulmonary vascularity is not engorged. There is tortuosity of the descending thoracic aorta. There is curvature of the thoracic spine with the convexity towards the right.  IMPRESSION: There has been interval increase  in the size of the left pleural effusion. No significant effusion on the right is demonstrated today.   Electronically Signed   By: David  [place]   On: 01/29/2013 11:21   Ct Chest Wo Contrast  01/29/2013   CLINICAL DATA:  Increasing dyspnea.  Productive cough.  EXAM: CT CHEST WITHOUT CONTRAST  TECHNIQUE: Multidetector CT imaging of the chest was performed following the standard protocol without IV contrast.  COMPARISON:  Chest radiographs obtained earlier today and chest CT dated 03/27/2006.  FINDINGS: Interval moderate to large-sized loculated pleural effusion in the left mid and lower lung zones. There is also a suggestion of a faintly visible pleural based mass inferiorly and medially, measuring 2.5 x 1.9 cm on image number 59. There is also a probable 2nd pleural based mass medially and inferiorly, measuring 2.8 x 1.5 cm on image number 64. Visualization of these masses is somewhat limited due to the lack of intravenous contrast. There is compressive atelectasis and some parenchymal calcification medial to the pleural fluid as well as deviation of the heart to the right. There is also patchy airspace opacity in the adjacent superior segment of the left lower lobe.  Dense proximal coronary artery calcifications are noted. Also noted is mild right  lower lobe atelectasis. No enlarged lymph nodes are seen. Again demonstrated are bilateral renal cysts. These include extensive bilateral parapelvic cysts and possible chronic hydronephrosis. There are also small gallstones in the gallbladder, the largest measuring 3 mm in maximum diameter. Degenerative changes in the thoracic and cervical spine with changes of DISH. Mild to moderate dextro convex scoliosis.  IMPRESSION: 1. Moderate to large-sized loculated left pleural fluid with possible pleural-based masses. These findings are concerning for a loculated malignant pleural effusion. Further evaluation with thoracentesis is recommended. The loculated pleural fluid is  causing deviation of the heart to the right. 2. Probable pneumonia in the superior segment of the left lower lobe. 3. Bilateral lower lobe atelectasis, greater on the left. 4. Dense proximal coronary artery calcifications. 5. Cholelithiasis. 6. Chronic bilateral renal parapelvic cysts and possible UPJ obstructions.   Electronically Signed   By: Gordan Payment M.D.   On: 01/29/2013 23:08   Dg Chest Left Decubitus  01/29/2013   CLINICAL DATA:  Evaluate for layering effusion  EXAM: CHEST - LEFT DECUBITUS  COMPARISON:  Prior chest x-ray 01/29/2013  FINDINGS: No evidence of layering pleural of fluid. Otherwise, no significant interval change in the appearance of the chest compared to earlier today.  IMPRESSION: No layering fluid. The left basilar opacity may reflect a loculated pleural effusion, dense consolidation, or potentially an underlying mass. Recommend further evaluation with contrasted CT scan of the chest.   Electronically Signed   By: Malachy Moan M.D.   On: 01/29/2013 15:47   US Renal  01/29/2013   CLINICAL DATA:  Followup hydronephrosis  EXAM: RENAL/URINARY TRACT ULTRASOUND COMPLETE  COMPARISON:  04/16/2012  FINDINGS: Right Kidney  Length: 10.4 cm. Echogenicity is within normal limits. There is been significant improvement in right hydronephrosis with residual wall pelvocaliectasis. Cyst within the inferior pole measures 10 x 7 x 8 mm.  Left Kidney  Length: Measures 10.7 cm. Echogenicity is within normal limits. There is moderate left hydronephrosis. This is improved when compared with the previous exam. Cyst within the upper pole measures 1.4 x 1.7 x 1.5 cm.  Bladder  Appears normal for degree of bladder distention.  Other:   The prostate gland is enlarged measuring 5 x 4.8 x 5.8 cm  IMPRESSION: 1. Interval improvement in severe bilateral hydronephrosis. There is residual right pelvocaliectasis and moderate left hydronephrosis. 2. Bilateral renal cysts. 3. Prostate gland enlargement.    Electronically Signed   By: Signa Kell M.D.   On: 01/29/2013 17:06    PHYSICAL EXAM General: cachetic in no acute distress. Head: Normal Neck: Negative for carotid bruits. JVD not elevated. Lungs: Diminished BS left 1/3 up. Chest tube in place. Heart: IRRR S1 S2 without murmurs, rubs, or gallops.  Abdomen: Soft, non-tender, non-distended with normoactive bowel sounds. No hepatomegaly.  Msk:  Strength and tone appears normal for age. Extremities: No clubbing, cyanosis or edema.  Distal pedal pulses are 2+ and equal bilaterally. Neuro: Alert and oriented X 3. Moves all extremities spontaneously. Psych:  Responds to questions appropriately with a normal affect.  ASSESSMENT AND PLAN: 1. Chronic systolic CHF- appears well compensated. Agree with holding diuretics now. Not a candidate for ACEi/ ARB/aldactone due to CKD. Patient has refused more aggressive Rx with beta blocker/nitrates/hydralazine in past. Does not want cardiac cath or consideration for ICD.  2. Left pleural effusion. Thoracentesis consistent with empyema. Chest tube now in place. On IV antibiotics. CT surgery consult noted. Recommended VATS but patient refusing 3. CKD stage III 4.  Moderate MR, mild-moderate AI 5. NSVT-asymptomatic 6. Remote history of non- Hodgkins lymphoma. 7. Paroxysmal AFib/flutter. Back in NSR. Would continue amiodarone while in house but can likely d/c prior to discharge. Not good candidate for anti-coagulation.    We will see again on Monday. Please call with questions.   Migdalia Dk MD 02/02/2013 12:13 PM

## 2013-02-03 LAB — BODY FLUID CULTURE

## 2013-02-03 MED ORDER — SODIUM CHLORIDE 0.9 % IV BOLUS (SEPSIS)
250.0000 mL | Freq: Once | INTRAVENOUS | Status: AC
  Administered 2013-02-03: 250 mL via INTRAVENOUS

## 2013-02-03 MED ORDER — POLYETHYLENE GLYCOL 3350 17 G PO PACK
17.0000 g | PACK | Freq: Two times a day (BID) | ORAL | Status: DC
  Administered 2013-02-03 – 2013-02-04 (×3): 17 g via ORAL
  Filled 2013-02-03 (×12): qty 1

## 2013-02-03 MED ORDER — POLYETHYLENE GLYCOL 3350 17 G PO PACK
17.0000 g | PACK | Freq: Every day | ORAL | Status: DC | PRN
  Filled 2013-02-03: qty 1

## 2013-02-03 NOTE — Progress Notes (Signed)
PULMONARY  / CRITICAL CARE MEDICINE  Name: [name] MRN: [id number] DOB: 1934/06/25    ADMISSION DATE:  01/29/2013 CONSULTATION DATE:  01/30/13   REFERRING MD :  [facility] PRIMARY SERVICE:  [facility] Primary MD:  Zenaida Deed  CHIEF COMPLAINT:  SOB  BRIEF PATIENT DESCRIPTION: 78 y.o. With PMH of CHF, CKD, ischemic cardiomyopathy, and h/o Non hodgkins lymphoma admitted by [facility] 11/11 for 1 week history of worsening SOB and productive cough.  CT scan of chest w/o contrast showed large left pleural effusion w/ possible loculation and two poorly visualized masses.    SIGNIFICANT EVENTS / STUDIES:  11/11 Admitted shortness of breath 11/11 CT chest w/o contrast >> Large left pleural effusion, possible loculations and right sided deviation of the heart, difficult to visualize masses likely pleural based 11/12 Left Thoracentesis >>> 1L pus, cytol=neg 11/12 CT placed  11/13 TCTS consult for surgical drainage and mgmt of trapped lung. Pt refused VATS 11/14 Some improvement/ re-expansion of LLL 11/15 CXR stable resid L effus, atx, tube ok  TUBES/LINES: 11/12 L Chest tube >>   CULTURES: 11/12 pleural fluid >> abundant GPC clusters >>   ANTIBIOTICS: Zosyn 11/12>> 11/13 Vancomycin 11/12 >>  Ceftriaxone 11/13 >>   SUBJECTIVE:  No new complaints  VITAL SIGNS: Temp:  [97.5 F (36.4 C)-98.2 F (36.8 C)] 98 F (36.7 C) (11/16 0533) Pulse Rate:  [70-106] 99 (11/16 0533) Resp:  [16-20] 16 (11/16 0533) BP: (92-97)/(55-59) 95/55 mmHg (11/16 0533) SpO2:  [99 %-100 %] 100 % (11/16 0533) Weight:  [57.97 kg (127 lb 12.8 oz)] 57.97 kg (127 lb 12.8 oz) (11/16 0533)  PHYSICAL EXAMINATION: General:  Thin cachectic, weak Neuro: No focal deficits HEENT: WNL Cardiovascular:  RRR gr 1/6 diast m at LSB  Lungs: clear anteriorly, decr BS on left, L chest tube in position Abdomen:  +BS, soft, non-tender Ext: No edema  I have reviewed all of today's lab results. Relevant abnormalities are discussed in the  A/P section  CXR 11/15>  No change  CXR 11/14>  IMPRESSION:  1. Large bore chest tube is noted over left lower pleural space in stable position. The left base pleural fluid and air collection remain. Air in the left pleural cavity has diminished on today's exam.  2. Mild left perihilar infiltrate.    ASSESSMENT: 1) L empyema - gm pos cocci in clusters, C&S still pending, Cytology- neg (no malig cells, +acute inflamm)... Trapped LLL  REC -  Pt refused VATS Cont Vanc and Ceftriaxone Continue suction on chest tube Follow daily chest Xrays  2) Severe ischemic cardiomyopathy w/ EF=20% on recent 2DEcho (notes from DrJordan indicate that he has refused cath as well); he missed f/u appts. Rec-  Cardiac & CHF management per [facility]  3) Chronic renal insuffic w/ Cr ~2 & stable, he has seen DrWebb in past for nephrology...   Lonzo Cloud. Kriste Basque, MD [facility] 02/03/13 @ 8:01AM

## 2013-02-03 NOTE — Progress Notes (Signed)
[facility] PROGRESS NOTE Interim History: 78 y.o. male with PMH significant for non ischemic cardiomyopathy, systolic heart failure EF 15 % by ECHO 03-2012, CKD stage III, Cr baseline 2.4, chronic hydronephrosis, Non Hodgkin lymphoma, who presents to the ED complaining of progressive SOB on exertion for last week. He relates dyspnea on exertion, he has to stop exercising in the treadmill due to dyspnea. He usually exercise for 40 minutes daily. He denies chest pain, nausea, vomiting. He is only taking 10 mg of lasix. Came in with SOB, CXR and CT chest done showed left sided effusion. Thoracentesis done showing purulent material (PCCM).   Filed Weights   02/01/13 0515 02/02/13 0410 02/03/13 0533  Weight: 56.8 kg (125 lb 3.5 oz) 57.3 kg (126 lb 5.2 oz) 57.97 kg (127 lb 12.8 oz)        Intake/Output Summary (Last 24 hours) at 02/03/13 0750 Last data filed at 02/03/13 9604  Gross per 24 hour  Intake   1080 ml  Output   1245 ml  Net   -165 ml     Assessment/Plan: Acute respiratory failure/Pneumonia/Pleural effusion, left/  Empyema: - Pulmonary consult, thoracocentesis 11.10.2014 Started empirically on vanc and rocephin 11.12.2014.  - He has refused VATS, d/w pulmonary recommended to Increase suction. - Afebrile, CXR continue to show expansion of lung. - Since has refused several interventions, has refused aggressive intervention, with beta blocker/nitrates/hydralazine in past. Does not want cardiac cath or consideration for ICD. Met with PMT and had good conversation about goals of care. - Culture gram positive cocci in cluster.  Paroxysmal a-fib: - likely relate to procedure. Was monitor and return to SR.   Hyperkalemia: - resolved.  Chronic systolic CHF (congestive heart failure): - Euvolemic. - Diuretic was initially held. - Not a candidate for ACEi/ ARB/aldactone due to CKD. Patient has refused more aggressive Rx with beta blocker/nitrates/hydralazine in past. Does not want  cardiac cath or consideration for ICD.  - Resume lasix.  CKD (chronic kidney disease), stage III - Cr baseline 2.4. He has prior history of bilateral hydronephrosis. He never follow up with urology. Repeated renal US.Interval improvement in severe bilateral hydronephrosis - start flomax.   Severe protein caloric mal-nutritionL: -  Ensure TID.  Code Status: Full  Family Communication: none  Disposition Plan: inpatient  Consultants:  Pulmonology  Cardiology Procedures:  none Antibiotics Vancomycin 11/12 >>  rocephin 11/12 >>      HPI/Subjective: No complains  Objective: Filed Vitals:   02/02/13 0900 02/02/13 1100 02/02/13 2024 02/03/13 0533  BP: 94/59 97/55 92/58  95/55  Pulse: 106 70 98 99  Temp: 97.5 F (36.4 C) 97.7 F (36.5 C) 98.2 F (36.8 C) 98 F (36.7 C)  TempSrc: Oral Oral Oral Oral  Resp: 18 20 20 16   Height:      Weight:    57.97 kg (127 lb 12.8 oz)  SpO2: 99% 100% 100% 100%     Exam:  General: Alert, awake, oriented x3, in no acute distress.  HEENT: No bruits, no goiter.  Heart: Regular rate and rhythm,  Lungs: moderate air movement, decrease sounds on left. Abdomen: Soft, nontender, nondistended, positive bowel sounds.  Neuro: Grossly intact, nonfocal.   Data Reviewed: Basic Metabolic Panel:  Recent Labs Lab 01/30/13 0200 01/31/13 0518 01/31/13 1012 01/31/13 1955 02/01/13 0545  NA 137 136 135 135 139  K 4.9 6.2* 5.8* 5.2* 5.1  CL 106 108 107 105 109  CO2 17* 16* 19 21 21   GLUCOSE 200* 98 138*  127* 86  BUN 67* 60* 57* 54* 51*  CREATININE 2.33* 2.12* 1.96* 2.14* 2.03*  CALCIUM 8.4 8.8 8.5 8.3* 8.3*   Liver Function Tests:  Recent Labs Lab 01/30/13 1715 01/31/13 1955 02/01/13 0545  AST  --  30 18  ALT  --  17 15  ALKPHOS  --  70 68  BILITOT  --  0.2* 0.3  PROT 7.1 6.7 6.6  ALBUMIN  --  1.7* 1.7*   No results found for this basename: LIPASE, AMYLASE,  in the last 168 hours No results found for this basename: AMMONIA,  in  the last 168 hours CBC:  Recent Labs Lab 01/29/13 1050 01/29/13 1105 01/30/13 0200 01/31/13 0518 01/31/13 1955 02/01/13 0545  WBC 7.1  --  6.9 7.9 6.5 5.4  NEUTROABS 6.0  --   --   --   --   --   HGB 9.0* 9.9* 8.8* 10.2* 9.4* 9.1*  HCT 26.5* 29.0* 26.1* 31.6* 29.3* 28.5*  MCV 83.6  --  83.1 85.4 85.4 84.6  PLT 290  --  288 316 267 281   Cardiac Enzymes:  Recent Labs Lab 01/29/13 1910 01/30/13 0200  TROPONINI <0.30 <0.30   BNP (last 3 results)  Recent Labs  04/12/12 1224 01/29/13 1050  PROBNP 18809.0* 11562.0*   CBG: No results found for this basename: GLUCAP,  in the last 168 hours  Recent Results (from the past 240 hour(s))  BODY FLUID CULTURE     Status: None   Collection Time    01/30/13  3:37 PM      Result Value Range Status   Specimen Description PLEURAL FLUID LEFT   Final   Special Requests 6.OML FLUID   Final   Gram Stain     Final   Value: ABUNDANT WBC PRESENT,BOTH PMN AND MONONUCLEAR     ABUNDANT GRAM POSITIVE COCCI IN CLUSTERS     Gram Stain Report Called to,Read Back By and Verified With: Gram Stain Report Called to,Read Back By and Verified With:  JAMES A @813  ON 01/31/13 BY SMIAS     Performed at [facility]     Final   Value: Culture reincubated for better growth     Performed at [facility]   Report Status PENDING   Incomplete  SURGICAL PCR SCREEN     Status: None   Collection Time    01/31/13  8:16 PM      Result Value Range Status   MRSA, PCR NEGATIVE  NEGATIVE Final   Staphylococcus aureus NEGATIVE  NEGATIVE Final   Comment:            The Xpert SA Assay (FDA     approved for NASAL specimens     in patients over 71 years of age),     is one component of     a comprehensive surveillance     program.  Test performance has     been validated by [employer] for patients greater     than or equal to 41 year old.     It is not intended     to diagnose infection nor to     guide or monitor treatment.      Studies: Dg Chest Port 1 View  02/02/2013   CLINICAL DATA:  Left-sided hydro pneumothorax.  EXAM: PORTABLE CHEST - 1 VIEW  COMPARISON:  One-view chest 02/01/2013.  CT chest 01/29/2013.  FINDINGS: The left-sided  chest tube is in place. The loculated pleural fluid collection continues to decrease in size. The amount of air in the collection is decreased as well. Left lower lobe airspace versus mass persists. The heart remains enlarged. The right lung is clear.  IMPRESSION: 1. Continued decrease and left-sided hydro pneumothorax. 2. Continued soft tissue density in the left lower lobe compatible with airspace disease or mass lesion. 3. Persistent cardiomegaly without failure.   Electronically Signed   By: Gennette Pac M.D.   On: 02/02/2013 07:47    Scheduled Meds: . amiodarone  400 mg Oral BID  . cefTRIAXone (ROCEPHIN)  IV  2 g Intravenous Q24H  . feeding supplement (ENSURE COMPLETE)  237 mL Oral QPC supper  . metoprolol tartrate  12.5 mg Oral BID  . polyethylene glycol  17 g Oral BID  . tamsulosin  0.4 mg Oral QPC supper  . vancomycin  500 mg Intravenous Q24H   Continuous Infusions:    Marinda Elk  [facility] Pager [phone]. If 8PM-8AM, please contact night-coverage at [email or web address], password [facility] 02/03/2013, 7:50 AM  LOS: 5 days

## 2013-02-03 NOTE — Progress Notes (Signed)
Md paged and aware, Pt's BP 84/50 manually. Pt c/o feeling "exhuasted, cant' keep my eyes open", ":Lightheaded and dizzy". Orders given and activated. Pt resting in bed with call bell within reach. Will continue to monitor.

## 2013-02-03 NOTE — Progress Notes (Signed)
Pt's blood pressure taken with dinamap was 87/50 with heart rate of 99. Pt asymptomatic.  BP taken manually was 82/48. MD paged and aware. Lopressor am dose held. Orders given by MD to administer am dose of Amiodarone. Medication given as ordered. Pt resting in bed comfortably with call bell within reach. Pt denies any pain at this time. Will continue to monitor.

## 2013-02-04 ENCOUNTER — Inpatient Hospital Stay (HOSPITAL_COMMUNITY): Payer: Medicare Other

## 2013-02-04 DIAGNOSIS — J869 Pyothorax without fistula: Secondary | ICD-10-CM

## 2013-02-04 MED ORDER — CEFAZOLIN SODIUM 1-5 GM-% IV SOLN
1.0000 g | Freq: Two times a day (BID) | INTRAVENOUS | Status: DC
  Administered 2013-02-04 – 2013-02-08 (×8): 1 g via INTRAVENOUS
  Filled 2013-02-04 (×10): qty 50

## 2013-02-04 NOTE — Progress Notes (Signed)
PULMONARY  / CRITICAL CARE MEDICINE  Name: [name] MRN: [id number] DOB: 03/07/1935    ADMISSION DATE:  01/29/2013 CONSULTATION DATE:  01/30/13   REFERRING MD :  [facility] PRIMARY SERVICE:  [facility] Primary MD:  Zenaida Deed  CHIEF COMPLAINT:  SOB  BRIEF PATIENT DESCRIPTION: 78 y/o with PMH of CHF, CKD, ischemic cardiomyopathy, and h/o Non hodgkins lymphoma admitted by [facility] 11/11 for 1 week history of worsening SOB and productive cough.  CT scan of chest w/o contrast showed large left pleural effusion w/ possible loculation and two poorly visualized masses.  L CT placed with strep in fluid.    SIGNIFICANT EVENTS / STUDIES:  11/11 - Admitted shortness of breath 11/11 - CT chest w/o contrast >> Large left pleural effusion, possible loculations and right sided deviation of the heart, difficult to visualize masses likely pleural based 11/12 - Left Thoracentesis >>> 1L pus, cytol=neg 11/12 - CT placed  11/13 - TCTS consult for surgical drainage and mgmt of trapped lung. Pt refused VATS 11/14 - Some improvement/ re-expansion of LLL 11/15 - CXR stable resid L effus, atx, tube ok  TUBES/LINES: 11/12 L Chest tube>>   CULTURES: 11/12 pleural fluid >> abundant strep>>PCN sens  ANTIBIOTICS: Zosyn 11/12>> 11/13 Vancomycin 11/12 >>  Ceftriaxone 11/13 >>   SUBJECTIVE: Pt denies acute complaints.  "feel pretty good"   VITAL SIGNS: Temp:  [97.6 F (36.4 C)-98.2 F (36.8 C)] 98.2 F (36.8 C) (11/17 0408) Pulse Rate:  [70-110] 106 (11/17 0926) Resp:  [18-20] 20 (11/17 0408) BP: (82-116)/(48-66) 98/55 mmHg (11/17 0926) SpO2:  [99 %-100 %] 99 % (11/17 0408)  PHYSICAL EXAMINATION: General:  Thin cachectic, weak Neuro: No focal deficits HEENT: WNL Cardiovascular:  RRR gr 1/6 diast m at LSB  Lungs: clear anteriorly, decr BS on left, L chest tube in position Abdomen:  +BS, soft, non-tender Ext: No edema  LABS   Recent Labs Lab 01/31/13 0518 01/31/13 1955 02/01/13 0545  HGB 10.2* 9.4*  9.1*  HCT 31.6* 29.3* 28.5*  WBC 7.9 6.5 5.4  PLT 316 267 281    Recent Labs Lab 01/30/13 0200 01/31/13 0518 01/31/13 1012 01/31/13 1955 02/01/13 0545  NA 137 136 135 135 139  K 4.9 6.2* 5.8* 5.2* 5.1  CL 106 108 107 105 109  CO2 17* 16* 19 21 21   GLUCOSE 200* 98 138* 127* 86  BUN 67* 60* 57* 54* 51*  CREATININE 2.33* 2.12* 1.96* 2.14* 2.03*  CALCIUM 8.4 8.8 8.5 8.3* 8.3*   CXR - 11/17 stable LLL loculated effusion, CT in good position  ASSESSMENT:  Left Empyema - Cytology- neg (no malig cells, + acute inflamm).  Culture positive for PCN sens Strep Trapped LLL  PLAN:  -Pt refused VATS -Cont Vanc and Ceftriaxone -Continue suction on chest tube ~ 50 ml out am of 11/17 -Follow daily chest Xrays -D6 / x abx -Pulmonary hygiene  Severe ischemic cardiomyopathy - w/ EF=20% on recent 2DEcho (notes from DrJordan indicate that he has refused cath as well); he missed f/u appts.  PLAN: -Cardiac & CHF management per [facility]  Chronic renal insuffic w/ Cr ~2 & stable, he has seen Dr. Hyman Hopes in past for Nephrology  Canary Brim, NP-C [facility] Pgr: [phone] or [phone]   Attending:  I have seen and examined the patient with nurse practitioner/resident and agree with the note above.   He needs a VATS. I spent quite some time today encouraging him to reconsider this.  MCQUAID, DOUGLAS  [facility] Pager: [phone] Cell: [phone] If no response, call [phone]

## 2013-02-04 NOTE — Progress Notes (Signed)
Patient [id number] [name]      DOB: [birth year]      JYN:[id number]   Palliative Medicine Team at [facility] Progress Note    Subjective:  Patient remains focused on God healing him.  Not open to me talking with his family.  He denies pain at this time. States he was able to get to the bathroom today. Chest tube remains in place.   Filed Vitals:   02/04/13 0926  BP: 98/55  Pulse: 106  Temp:   Resp:    Physical exam:  General Cachectic, no acute distress Chest : improved air entry anteriorly on the left but still decreased CVs; tachy, S1, S2 Abd: scaphoid, not tender, not distended Ext: thin frail Neuro: appears to have capacity, but judgement and insight impaired by religious belief in healing.   No new labs   Assessment and plan: 78 yr old with Strep infected complex effusion. Currently with chest tube and antibiotics.  Patient has refused VATS, and has not permitted the doctors to take care of his severe cardiomyopathy.  1.  DNR per patient  2.  Patient desires conservative treatment for his effusion  3.  Cardiomyopathy: patient limited physician medications.  He appears to have capacity for that choice.  Would love to encourage him to involve his daughter in his current and future care but he is refusing to let me talk with her.   Will continue to support as best we can.   Total time 15 min  Daron Breeding L. Ladona Ridgel, MD MBA The Palliative Medicine Team at [facility] Phone: [phone] Pager: [phone]

## 2013-02-04 NOTE — Progress Notes (Signed)
Patient ID: [name], male   DOB: 1934/08/29, 78 y.o.   MRN: [id number]      [address]       [name] [address]             [phone]                   Procedure(s) (LRB): VIDEO BRONCHOSCOPY (N/A) VIDEO ASSISTED THORACOSCOPY (VATS)/DECORTICATION (Left) PLEURAL BIOPSY (N/A)  LOS: 6 days   Subjective: Patient sys will let me know in a week if he will consider surgery  Objective: Vital signs in last 24 hours: Patient Vitals for the past 24 hrs:  BP Temp Temp src Pulse Resp SpO2 Weight  02/04/13 0926 98/55 mmHg - - 106 - - -  02/04/13 0408 116/65 mmHg 98.2 F (36.8 C) Oral 110 20 99 % -  02/03/13 2106 103/66 mmHg 98.2 F (36.8 C) Oral 104 18 100 % -  02/03/13 1810 96/61 mmHg - - 101 18 100 % -  02/03/13 1538 84/48 mmHg - - 98 - - -  02/03/13 1411 84/50 mmHg - - - - - -  02/03/13 1359 89/52 mmHg 97.6 F (36.4 C) Oral 96 18 100 % -    Filed Weights   02/01/13 0515 02/02/13 0410 02/03/13 0533  Weight: 125 lb 3.5 oz (56.8 kg) 126 lb 5.2 oz (57.3 kg) 127 lb 12.8 oz (57.97 kg)    Hemodynamic parameters for last 24 hours:    Intake/Output from previous day: 11/16 0701 - 11/17 0700 In: 1200 [P.O.:1200] Out: 1761 [Urine:1550; Stool:1; Chest Tube:210] Intake/Output this shift: Total I/O In: 240 [P.O.:240] Out: 1 [Stool:1]  Scheduled Meds: . amiodarone  400 mg Oral BID  . feeding supplement (ENSURE COMPLETE)  237 mL Oral QPC supper  . polyethylene glycol  17 g Oral BID  . tamsulosin  0.4 mg Oral QPC supper   Continuous Infusions:  PRN Meds:.acetaminophen, acetaminophen, HYDROcodone-acetaminophen, nitroGLYCERIN, polyethylene glycol  General appearance: appears older than stated age, cachectic and no distress Neurologic: intact Heart: irregularly irregular rhythm Lungs: diminished breath sounds bibasilar Abdomen: soft, non-tender; bowel sounds normal; no masses,  no organomegaly Extremities: extremities normal, atraumatic, no cyanosis or  edema, Homans sign is negative, no sign of DVT and no edema, redness or tenderness in the calves or thighs Wound: no air leak from ct  Lab Results: CBC:No results found for this basename: WBC, HGB, HCT, PLT,  in the last 72 hours BMET: No results found for this basename: NA, K, CL, CO2, GLUCOSE, BUN, CREATININE, CALCIUM,  in the last 72 hours  PT/INR: No results found for this basename: LABPROT, INR,  in the last 72 hours   Radiology Dg Chest Port 1 View  02/04/2013   CLINICAL DATA:  Followup effusion and chest tube  EXAM: PORTABLE CHEST - 1 VIEW  COMPARISON:  02/02/2013  FINDINGS: Loculated pleural fluid on the left with focal loculated left lower lateral pleural air is stable. The position of the left chest tube is also stable. No apical pneumothorax.  Right lung is clear.  IMPRESSION: Stable appearance from the prior exam. Persistent loculated left inferolateral hydro pneumothorax. Left chest tube is stable.   Electronically Signed   By: Amie Portland M.D.   On: 02/04/2013 07:33   Ct appears improved from first chest xray that looked like entrapped lung.  Assessment/Plan: Continue rx with ct and antibiotics Will sign off as patient has declined our intervention.    Gwenith Daily  Tyrone Sage MD 02/04/2013 1:41 PM

## 2013-02-04 NOTE — Progress Notes (Signed)
ANTIBIOTIC CONSULT NOTE - follow up Pharmacy Consult for ancef  Indication: L empyema  Allergies  Allergen Reactions  . Contrast Media [Iodinated Diagnostic Agents] Other (See Comments)    Patient does not want dye because it is hard on his kidneys  . Prednisone Rash  . Shellfish Allergy Itching and Rash    Crab meat    Patient Measurements: Height: 6' (182.9 cm) Weight:  (patient feeling too week to stand Bed wt 71.8  is not accura) IBW/kg (Calculated) : 77.6  Vital Signs: Temp: 98.2 F (36.8 C) (11/17 0408) Temp src: Oral (11/17 0408) BP: 98/55 mmHg (11/17 0926) Pulse Rate: 106 (11/17 0926) Intake/Output from previous day: 11/16 0701 - 11/17 0700 In: 1200 [P.O.:1200] Out: 1761 [Urine:1550; Stool:1; Chest Tube:210] Intake/Output from this shift: Total I/O In: 240 [P.O.:240] Out: 1 [Stool:1]  Labs: No results found for this basename: WBC, HGB, PLT, LABCREA, CREATININE,  in the last 72 hours Estimated Creatinine Clearance: 25.4 ml/min (by C-G formula based on Cr of 2.03). No results found for this basename: VANCOTROUGH, VANCOPEAK, VANCORANDOM, GENTTROUGH, GENTPEAK, GENTRANDOM, TOBRATROUGH, TOBRAPEAK, TOBRARND, AMIKACINPEAK, AMIKACINTROU, AMIKACIN,  in the last 72 hours    Assessment: 78 yr old male admitted with 1 week history of worsening SOB and productive cough. CT showed large pleural effusion. He underwent a thoracentesis and has been started on empiric antibiotics.  Cultures now show PCN sensitive streptococcus and patient to change to ancef. Last SCr= 2.03 and CrCl ~ 25.   Zosyn 11/12>>11/13 Vancomycin 11/12>> 11/17 CTX 11/13>> 11/17  11/12 Pleural fluid cx - streptococcus (sens to PCN)   Plan:  -Begin ancef 1gm IV q12h -Will follow renal function and plans for length of therapy  Harland German, Pharm D 02/04/2013 1:30 PM

## 2013-02-04 NOTE — Progress Notes (Signed)
[facility] PROGRESS NOTE Interim History: 78 y.o. male with PMH significant for non ischemic cardiomyopathy, systolic heart failure EF 15 % by ECHO 03-2012, CKD stage III, Cr baseline 2.4, chronic hydronephrosis, Non Hodgkin lymphoma, who presents to the ED complaining of progressive SOB on exertion for last week. He relates dyspnea on exertion, he has to stop exercising in the treadmill due to dyspnea. He usually exercise for 40 minutes daily. He denies chest pain, nausea, vomiting. He is only taking 10 mg of lasix. Came in with SOB, CXR and CT chest done showed left sided effusion. Thoracentesis done showing purulent material (PCCM).   Filed Weights   02/01/13 0515 02/02/13 0410 02/03/13 0533  Weight: 56.8 kg (125 lb 3.5 oz) 57.3 kg (126 lb 5.2 oz) 57.97 kg (127 lb 12.8 oz)        Intake/Output Summary (Last 24 hours) at 02/04/13 1152 Last data filed at 02/04/13 0730  Gross per 24 hour  Intake   1200 ml  Output   1261 ml  Net    -61 ml     Assessment/Plan: Acute respiratory failure/Pneumonia/Pleural effusion, left/  Empyema: - CXR continue to show expansion of lung. - Pulmonary consult, thoracocentesis 11.10.2014 Started empirically on vanc and rocephin 11.12.2014.  - He has refused VATS, d/w pulmonary recommended to Increase suction. - Since has refused several interventions, has refused aggressive intervention, with beta blocker/nitrates/hydralazine in past. Does not want cardiac cath or consideration for ICD. Met with PMT and had good conversation about goals of care. - Culture 11.12.2014: STREPTOCOCCUS SPECIES  Paroxysmal a-fib: - likely relate to procedure. Was monitor and return to SR.   Hyperkalemia: - resolved.  Chronic systolic CHF (congestive heart failure): - Euvolemic. - Diuretic was initially held. - Not a candidate for ACEi/ ARB/aldactone due to CKD. Patient has refused more aggressive Rx with beta blocker/nitrates/hydralazine in past. Does not want cardiac  cath or consideration for ICD.  - Resume lasix.  CKD (chronic kidney disease), stage III - Cr baseline 2.4. He has prior history of bilateral hydronephrosis. He never follow up with urology. Repeated renal US.Interval improvement in severe bilateral hydronephrosis - start flomax.   Severe protein caloric mal-nutritionL: -  Ensure TID.  Code Status: Full  Family Communication: none  Disposition Plan: inpatient  Consultants:  Pulmonology  Cardiology Procedures:  none Antibiotics Vancomycin 11/12 >>  rocephin 11/12 >>    HPI/Subjective: No complains  Objective: Filed Vitals:   02/03/13 1810 02/03/13 2106 02/04/13 0408 02/04/13 0926  BP: 96/61 103/66 116/65 98/55  Pulse: 101 104 110 106  Temp:  98.2 F (36.8 C) 98.2 F (36.8 C)   TempSrc:  Oral Oral   Resp: 18 18 20    Height:      Weight:      SpO2: 100% 100% 99%      Exam:  General: Alert, awake, oriented x3, in no acute distress.  HEENT: No bruits, no goiter.  Heart: Regular rate and rhythm,  Lungs: moderate air movement, decrease sounds on left. Abdomen: Soft, nontender, nondistended, positive bowel sounds.  Neuro: Grossly intact, nonfocal.   Data Reviewed: Basic Metabolic Panel:  Recent Labs Lab 01/30/13 0200 01/31/13 0518 01/31/13 1012 01/31/13 1955 02/01/13 0545  NA 137 136 135 135 139  K 4.9 6.2* 5.8* 5.2* 5.1  CL 106 108 107 105 109  CO2 17* 16* 19 21 21   GLUCOSE 200* 98 138* 127* 86  BUN 67* 60* 57* 54* 51*  CREATININE 2.33* 2.12* 1.96* 2.14*  2.03*  CALCIUM 8.4 8.8 8.5 8.3* 8.3*   Liver Function Tests:  Recent Labs Lab 01/30/13 1715 01/31/13 1955 02/01/13 0545  AST  --  30 18  ALT  --  17 15  ALKPHOS  --  70 68  BILITOT  --  0.2* 0.3  PROT 7.1 6.7 6.6  ALBUMIN  --  1.7* 1.7*   No results found for this basename: LIPASE, AMYLASE,  in the last 168 hours No results found for this basename: AMMONIA,  in the last 168 hours CBC:  Recent Labs Lab 01/29/13 1050 01/29/13 1105  01/30/13 0200 01/31/13 0518 01/31/13 1955 02/01/13 0545  WBC 7.1  --  6.9 7.9 6.5 5.4  NEUTROABS 6.0  --   --   --   --   --   HGB 9.0* 9.9* 8.8* 10.2* 9.4* 9.1*  HCT 26.5* 29.0* 26.1* 31.6* 29.3* 28.5*  MCV 83.6  --  83.1 85.4 85.4 84.6  PLT 290  --  288 316 267 281   Cardiac Enzymes:  Recent Labs Lab 01/29/13 1910 01/30/13 0200  TROPONINI <0.30 <0.30   BNP (last 3 results)  Recent Labs  04/12/12 1224 01/29/13 1050  PROBNP 18809.0* 11562.0*   CBG: No results found for this basename: GLUCAP,  in the last 168 hours  Recent Results (from the past 240 hour(s))  BODY FLUID CULTURE     Status: None   Collection Time    01/30/13  3:37 PM      Result Value Range Status   Specimen Description PLEURAL FLUID LEFT   Final   Special Requests 6.OML FLUID   Final   Gram Stain     Final   Value: ABUNDANT WBC PRESENT,BOTH PMN AND MONONUCLEAR     ABUNDANT GRAM POSITIVE COCCI IN CLUSTERS     Gram Stain Report Called to,Read Back By and Verified With: Gram Stain Report Called to,Read Back By and Verified With:  JAMES A @813  ON 01/31/13 BY SMIAS     Performed at [facility]   Culture     Final   Value: ABUNDANT STREPTOCOCCUS SPECIES     Note: IDENTIFIED AS STREPTOCOCCUS INTERMEDIUS     Performed at [facility]   Report Status 02/03/2013 FINAL   Final   Organism ID, Bacteria STREPTOCOCCUS SPECIES   Final  SURGICAL PCR SCREEN     Status: None   Collection Time    01/31/13  8:16 PM      Result Value Range Status   MRSA, PCR NEGATIVE  NEGATIVE Final   Staphylococcus aureus NEGATIVE  NEGATIVE Final   Comment:            The Xpert SA Assay (FDA     approved for NASAL specimens     in patients over 37 years of age),     is one component of     a comprehensive surveillance     program.  Test performance has     been validated by [employer] for patients greater     than or equal to 38 year old.     It is not intended     to diagnose infection nor to      guide or monitor treatment.     Studies: Dg Chest Port 1 View  02/04/2013   CLINICAL DATA:  Followup effusion and chest tube  EXAM: PORTABLE CHEST - 1 VIEW  COMPARISON:  02/02/2013  FINDINGS: Loculated pleural fluid  on the left with focal loculated left lower lateral pleural air is stable. The position of the left chest tube is also stable. No apical pneumothorax.  Right lung is clear.  IMPRESSION: Stable appearance from the prior exam. Persistent loculated left inferolateral hydro pneumothorax. Left chest tube is stable.   Electronically Signed   By: Amie Portland M.D.   On: 02/04/2013 07:33    Scheduled Meds: . amiodarone  400 mg Oral BID  . cefTRIAXone (ROCEPHIN)  IV  2 g Intravenous Q24H  . feeding supplement (ENSURE COMPLETE)  237 mL Oral QPC supper  . polyethylene glycol  17 g Oral BID  . tamsulosin  0.4 mg Oral QPC supper  . vancomycin  500 mg Intravenous Q24H   Continuous Infusions:    Marinda Elk  [facility] Pager [phone]. If 8PM-8AM, please contact night-coverage at [email or web address], password [facility] 02/04/2013, 11:52 AM  LOS: 6 days

## 2013-02-04 NOTE — Progress Notes (Signed)
TELEMETRY: Reviewed telemetry pt in NSR with rate 105 bpm, Occ. PACs,PVCs: Filed Vitals:   02/03/13 1538 02/03/13 1810 02/03/13 2106 02/04/13 0408  BP: 84/48 96/61 103/66 116/65  Pulse: 98 101 104 110  Temp:   98.2 F (36.8 C) 98.2 F (36.8 C)  TempSrc:   Oral Oral  Resp:  18 18 20   Height:      Weight:      SpO2:  100% 100% 99%    Intake/Output Summary (Last 24 hours) at 02/04/13 0740 Last data filed at 02/04/13 0611  Gross per 24 hour  Intake   1200 ml  Output   1761 ml  Net   -561 ml    SUBJECTIVE Breathing is  better. Some cough. No chest pain or orthopnea. Denies any palpitations.  LABS: Basic Metabolic Panel: No results found for this basename: NA, K, CL, CO2, GLUCOSE, BUN, CREATININE, CALCIUM, MG, PHOS,  in the last 72 hours CBC: No results found for this basename: WBC, NEUTROABS, HGB, HCT, MCV, PLT,  in the last 72 hours Cardiac Enzymes: No results found for this basename: CKTOTAL, CKMB, CKMBINDEX, TROPONINI,  in the last 72 hours BNP: 11562   Radiology/Studies:  Dg Chest 2 View  01/29/2013   CLINICAL DATA:  Shortness of breath, productive cough, weakness, and weight loss. There is history of coronary artery disease, CHF, and chronic renal failure.  EXAM: CHEST  2 VIEW  COMPARISON:  April 15, 2012.  FINDINGS: There is volume loss on the left likely secondary to a pleural effusion. On the right the lung is well-expanded and clear. The cardiac silhouette is top-normal in size. The pulmonary vascularity is not engorged. There is tortuosity of the descending thoracic aorta. There is curvature of the thoracic spine with the convexity towards the right.  IMPRESSION: There has been interval increase in the size of the left pleural effusion. No significant effusion on the right is demonstrated today.   Electronically Signed   By: David  [place]   On: 01/29/2013 11:21   Ct Chest Wo Contrast  01/29/2013   CLINICAL DATA:  Increasing dyspnea.  Productive cough.  EXAM: CT  CHEST WITHOUT CONTRAST  TECHNIQUE: Multidetector CT imaging of the chest was performed following the standard protocol without IV contrast.  COMPARISON:  Chest radiographs obtained earlier today and chest CT dated 03/27/2006.  FINDINGS: Interval moderate to large-sized loculated pleural effusion in the left mid and lower lung zones. There is also a suggestion of a faintly visible pleural based mass inferiorly and medially, measuring 2.5 x 1.9 cm on image number 59. There is also a probable 2nd pleural based mass medially and inferiorly, measuring 2.8 x 1.5 cm on image number 64. Visualization of these masses is somewhat limited due to the lack of intravenous contrast. There is compressive atelectasis and some parenchymal calcification medial to the pleural fluid as well as deviation of the heart to the right. There is also patchy airspace opacity in the adjacent superior segment of the left lower lobe.  Dense proximal coronary artery calcifications are noted. Also noted is mild right lower lobe atelectasis. No enlarged lymph nodes are seen. Again demonstrated are bilateral renal cysts. These include extensive bilateral parapelvic cysts and possible chronic hydronephrosis. There are also small gallstones in the gallbladder, the largest measuring 3 mm in maximum diameter. Degenerative changes in the thoracic and cervical spine with changes of DISH. Mild to moderate dextro convex scoliosis.  IMPRESSION: 1. Moderate to large-sized loculated left pleural fluid  with possible pleural-based masses. These findings are concerning for a loculated malignant pleural effusion. Further evaluation with thoracentesis is recommended. The loculated pleural fluid is causing deviation of the heart to the right. 2. Probable pneumonia in the superior segment of the left lower lobe. 3. Bilateral lower lobe atelectasis, greater on the left. 4. Dense proximal coronary artery calcifications. 5. Cholelithiasis. 6. Chronic bilateral renal  parapelvic cysts and possible UPJ obstructions.   Electronically Signed   By: Gordan Payment M.D.   On: 01/29/2013 23:08   Dg Chest Left Decubitus  01/29/2013   CLINICAL DATA:  Evaluate for layering effusion  EXAM: CHEST - LEFT DECUBITUS  COMPARISON:  Prior chest x-ray 01/29/2013  FINDINGS: No evidence of layering pleural of fluid. Otherwise, no significant interval change in the appearance of the chest compared to earlier today.  IMPRESSION: No layering fluid. The left basilar opacity may reflect a loculated pleural effusion, dense consolidation, or potentially an underlying mass. Recommend further evaluation with contrasted CT scan of the chest.   Electronically Signed   By: Malachy Moan M.D.   On: 01/29/2013 15:47   US Renal  01/29/2013   CLINICAL DATA:  Followup hydronephrosis  EXAM: RENAL/URINARY TRACT ULTRASOUND COMPLETE  COMPARISON:  04/16/2012  FINDINGS: Right Kidney  Length: 10.4 cm. Echogenicity is within normal limits. There is been significant improvement in right hydronephrosis with residual wall pelvocaliectasis. Cyst within the inferior pole measures 10 x 7 x 8 mm.  Left Kidney  Length: Measures 10.7 cm. Echogenicity is within normal limits. There is moderate left hydronephrosis. This is improved when compared with the previous exam. Cyst within the upper pole measures 1.4 x 1.7 x 1.5 cm.  Bladder  Appears normal for degree of bladder distention.  Other:   The prostate gland is enlarged measuring 5 x 4.8 x 5.8 cm  IMPRESSION: 1. Interval improvement in severe bilateral hydronephrosis. There is residual right pelvocaliectasis and moderate left hydronephrosis. 2. Bilateral renal cysts. 3. Prostate gland enlargement.   Electronically Signed   By: Signa Kell M.D.   On: 01/29/2013 17:06   Pleural culture: Streptococcus intermedius    PHYSICAL EXAM General: Well developed, thin, in no acute distress. Head: Normal Neck: Negative for carotid bruits. JVD not elevated. Lungs: Diminished  BS left base. Chest tube in place. Heart: IRRR S1 S2 without murmurs, rubs, or gallops.  Abdomen: Soft, non-tender, non-distended with normoactive bowel sounds. No hepatomegaly.  Msk:  Strength and tone appears normal for age. Extremities: No clubbing, cyanosis or edema.  Distal pedal pulses are 2+ and equal bilaterally. Neuro: Alert and oriented X 3. Moves all extremities spontaneously. Psych:  Responds to questions appropriately with a normal affect.  ASSESSMENT AND PLAN: 1. Chronic systolic CHF-EF 20%- appears well compensated. Agree with holding diuretics now. Not a candidate for ACEi/ ARB/aldactone due to CKD. Patient has refused more aggressive Rx with beta blocker/nitrates/hydralazine in past. Does not want cardiac cath or consideration for ICD.  2. Left pleural effusion. Thoracentesis consistent with empyema. Chest tube now in place. On IV antibiotics. Growing streptococcus sp. Refused VATS 3. CKD stage III 4. Moderate MR, mild-moderate AI 5. NSVT-asymptomatic 6. Remote history of non- Hodgkins lymphoma. 7. Paroxysmal AFib/flutter. Likely related to instrumentation with chest tube and empyema. Now converted to NSR. Will DC metoprolol due to low BP. Continue amiodarone for now.   Principal Problem:   Pleural effusion, left Active Problems:   Aortic insufficiency   CKD (chronic kidney disease), stage III  Acute systolic CHF (congestive heart failure)   Bilateral hydronephrosis   Cardiomyopathy, ischemic   CAD (coronary artery disease)   CAP (community acquired pneumonia)   Empyema   Paroxysmal a-fib   Acute respiratory failure   Trapped lung   Protein-calorie malnutrition, severe   Atrial fibrillation    Signed, Peter [place] MD,FACC 02/04/2013 7:40 AM

## 2013-02-05 ENCOUNTER — Inpatient Hospital Stay (HOSPITAL_COMMUNITY): Payer: Medicare Other

## 2013-02-05 DIAGNOSIS — E875 Hyperkalemia: Secondary | ICD-10-CM

## 2013-02-05 LAB — CBC
HCT: 28.2 % — ABNORMAL LOW (ref 39.0–52.0)
Hemoglobin: 8.8 g/dL — ABNORMAL LOW (ref 13.0–17.0)
MCH: 27.2 pg (ref 26.0–34.0)
MCHC: 31.2 g/dL (ref 30.0–36.0)
RBC: 3.23 MIL/uL — ABNORMAL LOW (ref 4.22–5.81)
RDW: 16.3 % — ABNORMAL HIGH (ref 11.5–15.5)

## 2013-02-05 LAB — BASIC METABOLIC PANEL
BUN: 48 mg/dL — ABNORMAL HIGH (ref 6–23)
BUN: 46 mg/dL — ABNORMAL HIGH (ref 6–23)
Calcium: 8.8 mg/dL (ref 8.4–10.5)
Chloride: 106 mEq/L (ref 96–112)
Chloride: 103 mEq/L (ref 96–112)
Creatinine, Ser: 1.98 mg/dL — ABNORMAL HIGH (ref 0.50–1.35)
Creatinine, Ser: 1.93 mg/dL — ABNORMAL HIGH (ref 0.50–1.35)
GFR calc Af Amer: 36 mL/min — ABNORMAL LOW (ref >90)
GFR calc Af Amer: 37 mL/min — ABNORMAL LOW (ref >90)
GFR calc non Af Amer: 32 mL/min — ABNORMAL LOW (ref >90)
Glucose, Bld: 109 mg/dL — ABNORMAL HIGH (ref 70–99)
Glucose, Bld: 130 mg/dL — ABNORMAL HIGH (ref 70–99)
Potassium: 6.2 mEq/L — ABNORMAL HIGH (ref 3.5–5.1)
Sodium: 137 mEq/L (ref 135–145)

## 2013-02-05 MED ORDER — AMIODARONE HCL 200 MG PO TABS
200.0000 mg | ORAL_TABLET | Freq: Two times a day (BID) | ORAL | Status: DC
  Administered 2013-02-05 (×2): 200 mg via ORAL
  Filled 2013-02-05 (×4): qty 1

## 2013-02-05 MED ORDER — SODIUM POLYSTYRENE SULFONATE 15 GM/60ML PO SUSP
30.0000 g | Freq: Once | ORAL | Status: AC
  Administered 2013-02-05: 30 g via ORAL
  Filled 2013-02-05: qty 120

## 2013-02-05 NOTE — Progress Notes (Signed)
   TELEMETRY: Reviewed telemetry pt in NSR with rate 105 bpm, Occ. PACs,PVCs: Filed Vitals:   02/04/13 0926 02/04/13 1300 02/04/13 2023 02/05/13 0614  BP: 98/55 94/58 99/58    Pulse: 106 103 104   Temp:  98.2 F (36.8 C) 98.7 F (37.1 C)   TempSrc:  Oral Oral   Resp:  16 18   Height:      Weight:    159 lb 9.8 oz (72.4 kg)  SpO2:  100% 100%     Intake/Output Summary (Last 24 hours) at 02/05/13 0755 Last data filed at 02/05/13 0604  Gross per 24 hour  Intake    480 ml  Output   1082 ml  Net   -602 ml    SUBJECTIVE Breathing is  better. No chest pain or orthopnea. Denies any palpitations.  LABS: Basic Metabolic Panel:  Recent Labs  16/10/96 0527  NA 137  K 6.2*  CL 106  CO2 26  GLUCOSE 109*  BUN 48*  CREATININE 1.98*  CALCIUM 8.8   CBC:  Recent Labs  02/05/13 0527  WBC 5.8  HGB 8.8*  HCT 28.2*  MCV 87.3  PLT 233   BNP: 11562   Radiology/Studies:  CLINICAL DATA: Followup effusion and chest tube  EXAM: PORTABLE CHEST - 1 VIEW  COMPARISON: 02/02/2013  FINDINGS: Loculated pleural fluid on the left with focal loculated left lower lateral pleural air is stable. The position of the left chest tube is also stable. No apical pneumothorax.  Right lung is clear.  IMPRESSION: Stable appearance from the prior exam. Persistent loculated left inferolateral hydro pneumothorax. Left chest tube is stable.   Electronically Signed By: Amie Portland M.D. On: 02/04/2013 07:33   Pleural culture: Streptococcus intermedius    PHYSICAL EXAM General: Well developed, thin, in no acute distress. Head: Normal Neck: Negative for carotid bruits. JVD not elevated. Lungs: Diminished BS left base. Chest tube in place. Heart: IRRR S1 S2 without murmurs, rubs, or gallops.  Abdomen: Soft, non-tender, non-distended with normoactive bowel sounds. No hepatomegaly.  Msk:  Strength and tone appears normal for age. Extremities: No clubbing, cyanosis or edema.  Distal  pedal pulses are 2+ and equal bilaterally. Neuro: Alert and oriented X 3. Moves all extremities spontaneously. Psych:  Responds to questions appropriately with a normal affect.  ASSESSMENT AND PLAN: 1. Chronic systolic CHF-EF 20%- appears well compensated. Agree with holding diuretics now. Not a candidate for ACEi/ ARB/aldactone due to CKD. Patient has refused more aggressive Rx with beta blocker/nitrates/hydralazine in past. Does not want cardiac cath or consideration for ICD.  2. Left pleural effusion. Thoracentesis consistent with empyema. Chest tube now in place. On IV antibiotics. Growing streptococcus sp. Refused VATS 3. CKD stage III- stqable 4. Moderate MR, mild-moderate AI 5. NSVT-asymptomatic 6. Remote history of non- Hodgkins lymphoma. 7. Paroxysmal AFib/flutter. Likely related to instrumentation with chest tube and empyema. Now converted to NSR. Metoprolol stopped due to hypotension. Will reduce amiodarone dose and observe.   Principal Problem:   Pleural effusion, left Active Problems:   Aortic insufficiency   CKD (chronic kidney disease), stage III   Acute systolic CHF (congestive heart failure)   Bilateral hydronephrosis   Cardiomyopathy, ischemic   CAD (coronary artery disease)   CAP (community acquired pneumonia)   Empyema   Paroxysmal a-fib   Acute respiratory failure   Trapped lung   Protein-calorie malnutrition, severe   Atrial fibrillation    Signed, Cherri Yera [place] MD,FACC 02/05/2013 7:55 AM

## 2013-02-05 NOTE — Progress Notes (Signed)
SATURATION QUALIFICATIONS: (This note is used to comply with regulatory documentation for home oxygen)  Patient Saturations on Room Air at Rest = 94%  Patient Saturations on Room Air while Ambulating = 76%  Patient Saturations on 2 Liters of oxygen while Ambulating = 90%  Please briefly explain why patient needs home oxygen:Pt desats without O2 with ambulation. Thanks. [facility] [phone] (pager)

## 2013-02-05 NOTE — Evaluation (Signed)
Physical Therapy Evaluation Patient Details Name: [name] MRN: [id number] DOB: Jan 10, 1935 Today's Date: 02/05/2013 Time: 4098-1191 PT Time Calculation (min): 30 min  PT Assessment / Plan / Recommendation History of Present Illness    78 y.o. male with PMH significant for non ischemic cardiomyopathy, systolic heart failure EF 15 % by ECHO 03-2012, CKD stage III, Cr baseline 2.4, chronic hydronephrosis, Non Hodgkin lymphoma, who presents to the ED complaining of progressive SOB on exertion for last week. He relates dyspnea on exertion, he has to stop exercising in the treadmill due to dyspnea. He usually exercise for 40 minutes daily. He denies chest pain, nausea, vomiting. He is only taking 10 mg of lasix. Came in with SOB, CXR and CT chest done showed left sided effusion. Thoracentesis done showing purulent material (PCCM).   Clinical Impression  Pt admitted with abpve. Pt currently with functional limitations due to the deficits listed below (see PT Problem List). Pt will need 24 hour assist and states his daughter can provide this assist.  Poor safety awareness and pt very weak.  Will need HHPT  F/u and RW and 3N1 as well.   Pt will benefit from skilled PT to increase their independence and safety with mobility to allow discharge to the venue listed below.     PT Assessment  Patient needs continued PT services    Follow Up Recommendations  Home health PT;Supervision/Assistance - 24 hour                Equipment Recommendations  Rolling walker with 5" wheels;3in1 (PT)         Frequency Min 3X/week    Precautions / Restrictions Precautions Precautions: Fall Precaution Comments: chest tube on left Restrictions Weight Bearing Restrictions: No   Pertinent Vitals/Pain O2 sats down to 77% on RA therefore replaced O2 at 2 L.  No pain      Mobility  Bed Mobility Bed Mobility: Rolling Left;Left Sidelying to Sit;Sitting - Scoot to Edge of Bed Rolling Left: 3: Mod assist;With  rail Left Sidelying to Sit: 3: Mod assist;With rails;HOB elevated Sitting - Scoot to Edge of Bed: 3: Mod assist Details for Bed Mobility Assistance: Assist to move LEs off bed and for elevation of trunk. Transfers Transfers: Sit to Stand;Stand to Sit Sit to Stand: With upper extremity assist;From bed;3: Mod assist Stand to Sit: 3: Mod assist;With upper extremity assist;With armrests;To chair/3-in-1 Details for Transfer Assistance: cues for hand placement and assist for power up.  Difficult for pt to obtain full upright posture - stays somewhat flexed.  Assist needed to control descent to toilet and to chair.  Needed total assist to clean self after toileting.   Ambulation/Gait Ambulation/Gait Assistance: 3: Mod assist Ambulation Distance (Feet): 25 Feet Assistive device: Rolling walker Ambulation/Gait Assistance Details: Pt ambulated to bathroom and had BM.  Then ambulated back to chair.  Pt with very flexed posture needing constant cues to stand upright.  Pt needed cues and assist not to push RW too far in front of him as well as to sequence steps and steer RW.  Pt overall with poor safety awareness using RW.   Gait Pattern: Step-to pattern;Decreased step length - right;Decreased step length - left;Decreased stride length;Shuffle;Wide base of support;Trunk flexed Gait velocity: decreased Stairs: No Wheelchair Mobility Wheelchair Mobility: No         PT Diagnosis: Generalized weakness  PT Problem List: Decreased activity tolerance;Decreased balance;Decreased mobility;Decreased knowledge of use of DME;Decreased safety awareness;Decreased knowledge of precautions PT Treatment Interventions:  DME instruction;Gait training;Functional mobility training;Therapeutic activities;Therapeutic exercise;Patient/family education     PT Goals(Current goals can be found in the care plan section) Acute Rehab PT Goals Patient Stated Goal: to gohome PT Goal Formulation: With patient Time For Goal  Achievement: 02/12/13 Potential to Achieve Goals: Good  Visit Information  Last PT Received On: 02/05/13 Assistance Needed: +1       Prior Functioning  Home Living Family/patient expects to be discharged to:: Private residence Living Arrangements: Alone Available Help at Discharge: Family;Available 24 hours/day (daughter) Type of Home: Apartment Home Access: Elevator Home Layout: Two level Home Equipment: None Prior Function Level of Independence: Independent with assistive device(s) Communication Communication: No difficulties Dominant Hand: Right    Cognition  Cognition Arousal/Alertness: Awake/alert Behavior During Therapy: WFL for tasks assessed/performed Overall Cognitive Status: Within Functional Limits for tasks assessed    Extremity/Trunk Assessment Upper Extremity Assessment Upper Extremity Assessment: Defer to OT evaluation Lower Extremity Assessment Lower Extremity Assessment: Generalized weakness Cervical / Trunk Assessment Cervical / Trunk Assessment: Kyphotic   Balance Balance Balance Assessed: Yes Static Standing Balance Static Standing - Balance Support: Bilateral upper extremity supported;During functional activity Static Standing - Level of Assistance: 3: Mod assist Static Standing - Comment/# of Minutes: 2  End of Session PT - End of Session Equipment Utilized During Treatment: Gait belt;Oxygen Activity Tolerance: Patient limited by fatigue Patient left: in chair;with call bell/phone within reach Nurse Communication: Mobility status       INGOLD,Amillion Scobee 02/05/2013, 4:34 PM  [facility] [phone] (pager)

## 2013-02-05 NOTE — Progress Notes (Signed)
I visited with [name] and my visit with him went extremely well! He was polite and generous towards me and right away he explained to me that he not only believes in God but that he is a born again [name] that has a relationship with [employer]. He was very upbeat in his spirit about his current physical condition and he informed me that his present circumstance will not hinder him from trusting and believing in the God who has blessed him throughout the span of his life. Before I left the room, I asked him if I could pray for him and he gladly accepted my offer.  Chaplain Bryson Ha Briar Witherspoon

## 2013-02-05 NOTE — Progress Notes (Signed)
PULMONARY  / CRITICAL CARE MEDICINE  Name: [name] MRN: [id number] DOB: [birth year]    ADMISSION DATE:  01/29/2013 CONSULTATION DATE:  01/30/13   REFERRING MD :  [facility] PRIMARY SERVICE:  [facility] Primary MD:  Zenaida Deed  CHIEF COMPLAINT:  SOB  BRIEF PATIENT DESCRIPTION: 78 y/o with PMH of CHF, CKD, ischemic cardiomyopathy, and h/o Non hodgkins lymphoma admitted by [facility] 11/11 for 1 week history of worsening SOB and productive cough.  CT scan of chest w/o contrast showed large left pleural effusion w/ possible loculation and two poorly visualized masses.  L CT placed with strep in fluid.    SIGNIFICANT EVENTS / STUDIES:  11/11 - Admitted shortness of breath 11/11 - CT chest w/o contrast >> Large left pleural effusion, possible loculations and right sided deviation of the heart, difficult to visualize masses likely pleural based 11/12 - Left Thoracentesis >>> 1L pus, cytol=neg 11/12 - CT placed  11/13 - TCTS consult for surgical drainage and mgmt of trapped lung. Pt refused VATS 11/14 - Some improvement/ re-expansion of LLL 11/15 - CXR stable resid L effus, atx, tube ok 11/18 - minimal CT drainage TUBES/LINES: 11/12 L Chest tube>>   CULTURES: 11/12 pleural fluid >> abundant strep>>PCN sens  ANTIBIOTICS: Zosyn 11/12>> 11/13 Vancomycin 11/12 >> 11/17 Ceftriaxone 11/13 >> 11/17 11/17 cefazolin >>   SUBJECTIVE: Denies known fevers but had sweating overnight.   No other acute events.    VITAL SIGNS: Temp:  [98.2 F (36.8 C)-98.7 F (37.1 C)] 98.7 F (37.1 C) (11/17 2023) Pulse Rate:  [103-104] 103 (11/18 0901) Resp:  [16-18] 18 (11/17 2023) BP: (94-117)/(58-68) 117/68 mmHg (11/18 0901) SpO2:  [100 %] 100 % (11/17 2023) Weight:  [159 lb 9.8 oz (72.4 kg)] 159 lb 9.8 oz (72.4 kg) (11/18 1914)  PHYSICAL EXAMINATION: General:  Thin cachectic, weak Neuro: No focal deficits HEENT: WNL Cardiovascular:  RRR gr 1/6 m at LSB  Lungs: clear anteriorly, decr BS on left, L chest tube in  position Abdomen:  +BS, soft, non-tender Ext: No edema  LABS   Recent Labs Lab 01/31/13 1955 02/01/13 0545 02/05/13 0527  HGB 9.4* 9.1* 8.8*  HCT 29.3* 28.5* 28.2*  WBC 6.5 5.4 5.8  PLT 267 281 233    Recent Labs Lab 01/31/13 0518 01/31/13 1012 01/31/13 1955 02/01/13 0545 02/05/13 0527  NA 136 135 135 139 137  K 6.2* 5.8* 5.2* 5.1 6.2*  CL 108 107 105 109 106  CO2 16* 19 21 21 26   GLUCOSE 98 138* 127* 86 109*  BUN 60* 57* 54* 51* 48*  CREATININE 2.12* 1.96* 2.14* 2.03* 1.98*  CALCIUM 8.8 8.5 8.3* 8.3* 8.8   CXR - 11/18 stable LLL loculated effusion, CT in good position  ASSESSMENT:  Left Empyema - Cytology- neg (no malig cells, + acute inflamm).  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  He continues to desire conservative Rx but will think about it.  -Cont Vanc and Ceftriaxone -Continue suction on chest tube, minimal drainage.    -Follow daily chest Xrays -D7 / x abx -Pulmonary hygiene  Severe ischemic cardiomyopathy - w/ EF=20% on recent 2DEcho (notes from DrJordan indicate that he has refused cath as well); he missed f/u appts.  PLAN: -Cardiac & CHF management per [facility]  Chronic renal insuffic w/ Cr ~2 & stable, he has seen Dr. Hyman Hopes in past for Nephrology Hyperkalemia  PLAN: -kayexalate x 1 -repeat BMP 11/18  Canary Brim, NP-C [facility] Pgr: [phone] or [phone]  Attending: I have seen and examined the patient with nurse practitioner/resident and agree with the note above.   Mr. Marschall remains resolutely undecided.  Looking at his CXR, I see no evidence that we are adequately treating his empyema with the chest tube.  A chest ct would likely reveal further pockets of undrained fluid, but given that he does not want VATS there is no point in ordering this.    I asked him to let us know one way or another on the VATS.  He still wants to "think about  it".  Yolonda Kida PCCM Pager: [phone] Cell: [phone] If no response, call [phone]

## 2013-02-05 NOTE — Progress Notes (Addendum)
[facility] PROGRESS NOTE Interim History: 78 y.o. male with PMH significant for non ischemic cardiomyopathy, systolic heart failure EF 15 % by ECHO 03-2012, CKD stage III, Cr baseline 2.4, chronic hydronephrosis, Non Hodgkin lymphoma, who presents to the ED complaining of progressive SOB on exertion for last week. He relates dyspnea on exertion, he has to stop exercising in the treadmill due to dyspnea. He usually exercise for 40 minutes daily. He denies chest pain, nausea, vomiting. He is only taking 10 mg of lasix. Came in with SOB, CXR and CT chest done showed left sided effusion. Thoracentesis done showing purulent material (PCCM).   Filed Weights   02/05/13 1610 02/05/13 0901 02/05/13 1100  Weight: 72.4 kg (159 lb 9.8 oz) 58.559 kg (129 lb 1.6 oz) 56.382 kg (124 lb 4.8 oz)        Intake/Output Summary (Last 24 hours) at 02/05/13 1432 Last data filed at 02/05/13 1100  Gross per 24 hour  Intake    240 ml  Output   1082 ml  Net   -842 ml     Assessment/Plan: Acute respiratory failure/Pneumonia/Pleural effusion, left/  Empyema: - CXR continue to show expansion of lung. - Pulmonary consult, thoracocentesis 11.10.2014 Started empirically on vanc and rocephin 11.12.2014.  - He has refused VATS again.  - Since has refused several interventions, has refused aggressive intervention, with beta blocker/nitrates/hydralazine in past. Does not want cardiac cath or consideration for ICD. Met with PMT and had good conversation about goals of care. - Culture 11.12.2014: STREPTOCOCCUS SPECIES sensitive to penicillin, de-escalate treament to PCN  Paroxysmal a-fib: - likely relate to procedure. Was monitor and return to SR.   Hyperkalemia: - recur. - repeated kayexalate. - b-met in am  Chronic systolic CHF (congestive heart failure): - Euvolemic. - Diuretic was initially held. - Not a candidate for ACEi/ ARB/aldactone due to CKD. Patient has refused more aggressive Rx with beta  blocker/nitrates/hydralazine in past. Does not want cardiac cath or consideration for ICD.  - Resume lasix.  CKD (chronic kidney disease), stage III - Cr baseline 2.4. He has prior history of bilateral hydronephrosis. He never follow up with urology. Repeated renal US.Interval improvement in severe bilateral hydronephrosis - start flomax.   Severe protein caloric mal-nutritionL: -  Ensure TID.  Code Status: Full  Family Communication: none  Disposition Plan: inpatient  Consultants:  Pulmonology  Cardiology Procedures:  none Antibiotics Vancomycin 11/12 >>  rocephin 11/12 >>    HPI/Subjective: No complains  Objective: Filed Vitals:   02/04/13 2023 02/05/13 0614 02/05/13 0901 02/05/13 1100  BP: 99/58  117/68   Pulse: 104  103   Temp: 98.7 F (37.1 C)     TempSrc: Oral     Resp: 18     Height:      Weight:  72.4 kg (159 lb 9.8 oz) 58.559 kg (129 lb 1.6 oz) 56.382 kg (124 lb 4.8 oz)  SpO2: 100%        Exam:  General: Alert, awake, oriented x3, in no acute distress.  HEENT: No bruits, no goiter.  Heart: Regular rate and rhythm,  Lungs: moderate air movement, decrease sounds on left. Abdomen: Soft, nontender, nondistended, positive bowel sounds.  Neuro: Grossly intact, nonfocal.   Data Reviewed: Basic Metabolic Panel:  Recent Labs Lab 01/31/13 0518 01/31/13 1012 01/31/13 1955 02/01/13 0545 02/05/13 0527  NA 136 135 135 139 137  K 6.2* 5.8* 5.2* 5.1 6.2*  CL 108 107 105 109 106  CO2 16* 19 21  21 26  GLUCOSE 98 138* 127* 86 109*  BUN 60* 57* 54* 51* 48*  CREATININE 2.12* 1.96* 2.14* 2.03* 1.98*  CALCIUM 8.8 8.5 8.3* 8.3* 8.8   Liver Function Tests:  Recent Labs Lab 01/30/13 1715 01/31/13 1955 02/01/13 0545  AST  --  30 18  ALT  --  17 15  ALKPHOS  --  70 68  BILITOT  --  0.2* 0.3  PROT 7.1 6.7 6.6  ALBUMIN  --  1.7* 1.7*   No results found for this basename: LIPASE, AMYLASE,  in the last 168 hours No results found for this basename:  AMMONIA,  in the last 168 hours CBC:  Recent Labs Lab 01/30/13 0200 01/31/13 0518 01/31/13 1955 02/01/13 0545 02/05/13 0527  WBC 6.9 7.9 6.5 5.4 5.8  HGB 8.8* 10.2* 9.4* 9.1* 8.8*  HCT 26.1* 31.6* 29.3* 28.5* 28.2*  MCV 83.1 85.4 85.4 84.6 87.3  PLT 288 316 267 281 233   Cardiac Enzymes:  Recent Labs Lab 01/29/13 1910 01/30/13 0200  TROPONINI <0.30 <0.30   BNP (last 3 results)  Recent Labs  04/12/12 1224 01/29/13 1050  PROBNP 18809.0* 11562.0*   CBG: No results found for this basename: GLUCAP,  in the last 168 hours  Recent Results (from the past 240 hour(s))  BODY FLUID CULTURE     Status: None   Collection Time    01/30/13  3:37 PM      Result Value Range Status   Specimen Description PLEURAL FLUID LEFT   Final   Special Requests 6.OML FLUID   Final   Gram Stain     Final   Value: ABUNDANT WBC PRESENT,BOTH PMN AND MONONUCLEAR     ABUNDANT GRAM POSITIVE COCCI IN CLUSTERS     Gram Stain Report Called to,Read Back By and Verified With: Gram Stain Report Called to,Read Back By and Verified With:  JAMES A @813  ON 01/31/13 BY SMIAS     Performed at [facility]   Culture     Final   Value: ABUNDANT STREPTOCOCCUS SPECIES     Note: IDENTIFIED AS STREPTOCOCCUS INTERMEDIUS     Performed at [facility]   Report Status 02/03/2013 FINAL   Final   Organism ID, Bacteria STREPTOCOCCUS SPECIES   Final  SURGICAL PCR SCREEN     Status: None   Collection Time    01/31/13  8:16 PM      Result Value Range Status   MRSA, PCR NEGATIVE  NEGATIVE Final   Staphylococcus aureus NEGATIVE  NEGATIVE Final   Comment:            The Xpert SA Assay (FDA     approved for NASAL specimens     in patients over 29 years of age),     is one component of     a comprehensive surveillance     program.  Test performance has     been validated by [employer] for patients greater     than or equal to 65 year old.     It is not intended     to diagnose infection nor to      guide or monitor treatment.     Studies: Dg Chest Port 1 View  02/05/2013   CLINICAL DATA:  78 year old male with left diffusion, pleural nodularity. Initial encounter.  EXAM: PORTABLE CHEST - 1 VIEW  COMPARISON:  02/04/2013 and earlier.  FINDINGS: Portable AP semi upright view at  0657 hrs. Stable left chest tube, projecting in the mid left lung. Suspect a residual component of more caudal loculated fluid. Overall stable ventilation. No pneumothorax identified. Stable cardiac size and mediastinal contours. Of the right lung remains grossly clear.  IMPRESSION: Stable ventilation. Left chest tube in place just below the level of the left hilum. Residual lung base effusion.   Electronically Signed   By: Augusto Gamble M.D.   On: 02/05/2013 08:02   Dg Chest Port 1 View  02/04/2013   CLINICAL DATA:  Followup effusion and chest tube  EXAM: PORTABLE CHEST - 1 VIEW  COMPARISON:  02/02/2013  FINDINGS: Loculated pleural fluid on the left with focal loculated left lower lateral pleural air is stable. The position of the left chest tube is also stable. No apical pneumothorax.  Right lung is clear.  IMPRESSION: Stable appearance from the prior exam. Persistent loculated left inferolateral hydro pneumothorax. Left chest tube is stable.   Electronically Signed   By: Amie Portland M.D.   On: 02/04/2013 07:33    Scheduled Meds: . amiodarone  200 mg Oral BID  .  ceFAZolin (ANCEF) IV  1 g Intravenous Q12H  . feeding supplement (ENSURE COMPLETE)  237 mL Oral QPC supper  . polyethylene glycol  17 g Oral BID  . tamsulosin  0.4 mg Oral QPC supper   Continuous Infusions:    Marinda Elk  [facility] Pager [phone]. If 8PM-8AM, please contact night-coverage at [email or web address], password [facility] 02/05/2013, 2:32 PM  LOS: 7 days

## 2013-02-06 ENCOUNTER — Inpatient Hospital Stay (HOSPITAL_COMMUNITY): Payer: Medicare Other

## 2013-02-06 LAB — BASIC METABOLIC PANEL
Calcium: 8.4 mg/dL (ref 8.4–10.5)
Chloride: 104 mEq/L (ref 96–112)
Creatinine, Ser: 2.14 mg/dL — ABNORMAL HIGH (ref 0.50–1.35)
GFR calc Af Amer: 33 mL/min — ABNORMAL LOW (ref >90)
GFR calc non Af Amer: 28 mL/min — ABNORMAL LOW (ref >90)
Glucose, Bld: 149 mg/dL — ABNORMAL HIGH (ref 70–99)

## 2013-02-06 MED ORDER — SODIUM CHLORIDE 0.9 % IV SOLN
INTRAVENOUS | Status: AC
  Administered 2013-02-06: 75 mL/h via INTRAVENOUS
  Administered 2013-02-07: 05:00:00 via INTRAVENOUS

## 2013-02-06 MED ORDER — HEPARIN SODIUM (PORCINE) 5000 UNIT/ML IJ SOLN
5000.0000 [IU] | Freq: Three times a day (TID) | INTRAMUSCULAR | Status: DC
  Administered 2013-02-06 – 2013-02-08 (×6): 5000 [IU] via SUBCUTANEOUS
  Filled 2013-02-06 (×10): qty 1

## 2013-02-06 MED ORDER — SODIUM POLYSTYRENE SULFONATE 15 GM/60ML PO SUSP
30.0000 g | Freq: Once | ORAL | Status: AC
  Administered 2013-02-06: 30 g via RECTAL
  Filled 2013-02-06: qty 120

## 2013-02-06 NOTE — Progress Notes (Signed)
Clinical Social Work Department CLINICAL SOCIAL WORK PLACEMENT NOTE 02/06/2013  Patient:  [name], [name]  Account Number:  [id number] Admit date:  01/29/2013  Clinical Social Worker:  Carren Rang  Date/time:  02/06/2013 01:39 PM  Clinical Social Work is seeking post-discharge placement for this patient at the following level of care:   SKILLED NURSING   (*CSW will update this form in Epic as items are completed)   02/06/2013  Patient/family provided with [facility] Department of Clinical Social Work's list of facilities offering this level of care within the geographic area requested by the patient (or if unable, by the patient's family).  02/06/2013  Patient/family informed of their freedom to choose among providers that offer the needed level of care, that participate in Medicare, Medicaid or managed care program needed by the patient, have an available bed and are willing to accept the patient.  02/06/2013  Patient/family informed of [facility]' ownership interest in [facility], as well as of the fact that they are under no obligation to receive care at this facility.  PASARR submitted to EDS on 02/06/2013 PASARR number received from EDS on 02/06/2013  FL2 transmitted to all facilities in geographic area requested by pt/family on  02/06/2013 FL2 transmitted to all facilities within larger geographic area on   Patient informed that his/her managed care company has contracts with or will negotiate with  certain facilities, including the following:     Patient/family informed of bed offers received:   Patient chooses bed at  Physician recommends and patient chooses bed at    Patient to be transferred to  on   Patient to be transferred to facility by   The following physician request were entered in Epic:   Additional Comments:  Maree Krabbe, MSW, [employer] [phone]

## 2013-02-06 NOTE — Progress Notes (Signed)
Physical Therapy Treatment Patient Details Name: [name] MRN: [id number] DOB: December 21, 1934 Today's Date: 02/06/2013 Time: 4098-1191 PT Time Calculation (min): 39 min  PT Assessment / Plan / Recommendation  History of Present Illness [name] is a 78 y.o. male with PMH significant for non ischemic cardiomyopathy, systolic heart failure EF 15 % by ECHO 03-2012, CKD stage III, Cr baseline 2.4, chronic hydronephrosis, Non Hodgkin lymphoma, who presents to the ED complaining of progressive SOB on exertion for last week. He relates dyspnea on exertion, he has to stop exercising in the treadmill due to dyspnea. He usually exercise for 40 minutes daily. He denies chest pain, nausea, vomiting. He is only taking 10 mg of lasix.  He is suppose to be taking 40 mg.  Pt currently has a chest tube.   PT Comments   Pt is making good progress with mobility. Pt does demonstrate LE weakness L>R affecting mobility. Pt demonstrates decreased safety, balance, and sequencing with RW. Recommend min assist with mobility at home and follow-up HHPT. O2 sats stayed at 95% on RA after gait and exercises.  Follow Up Recommendations  Home health PT;Supervision for mobility/OOB     Does the patient have the potential to tolerate intense rehabilitation     Barriers to Discharge        Equipment Recommendations  Rolling walker with 5" wheels;3in1 (PT)    Recommendations for Other Services    Frequency Min 3X/week   Progress towards PT Goals Progress towards PT goals: Progressing toward goals  Plan Current plan remains appropriate    Precautions / Restrictions Precautions Precautions: Fall;Other (comment) (chest tube) Restrictions Weight Bearing Restrictions: No   Pertinent Vitals/Pain     Mobility  Bed Mobility Bed Mobility: Rolling Right;Right Sidelying to Sit Rolling Left: 6: Modified independent (Device/Increase time) Right Sidelying to Sit: 4: Min assist Sitting - Scoot to Edge of Bed: 5:  Supervision Details for Bed Mobility Assistance: assistance with trunk to sit up and assistance to lift legs into bed Transfers Transfers: Sit to Stand;Stand to Sit Sit to Stand: 4: Min assist Stand to Sit: 4: Min assist Details for Transfer Assistance: cues for hand placement, pt wants to put both hands on RW to stand up, cues for upright posture and to fully extend knees. Ambulation/Gait Ambulation/Gait Assistance: 4: Min assist Ambulation Distance (Feet): 90 Feet (2 trials) Assistive device: Rolling walker Ambulation/Gait Assistance Details: Pt fatigues quickly, constant cues for upright posture and to stay inside the rw, pt has some difficulty advancing Left LE due to weakness during swing Gait Pattern: Step-to pattern;Decreased stride length;Decreased hip/knee flexion - left;Trunk flexed Gait velocity: decreased Stairs: No    Exercises General Exercises - Lower Extremity Quad Sets: AROM;Strengthening;Both;10 reps;Supine Heel Slides: AROM;AAROM;Strengthening;Both;10 reps (aarom on LLE) Hip ABduction/ADduction: AROM;AAROM;Strengthening;Both;Supine (aarom LLE) Straight Leg Raises: AROM;AAROM;Strengthening;Both;10 reps (AAROM LLE)   PT Diagnosis:    PT Problem List:   PT Treatment Interventions:     PT Goals (current goals can now be found in the care plan section)    Visit Information  Last PT Received On: 02/06/13 Assistance Needed: +1 History of Present Illness: [name] is a 78 y.o. male with PMH significant for non ischemic cardiomyopathy, systolic heart failure EF 15 % by ECHO 03-2012, CKD stage III, Cr baseline 2.4, chronic hydronephrosis, Non Hodgkin lymphoma, who presents to the ED complaining of progressive SOB on exertion for last week. He relates dyspnea on exertion, he has to stop exercising in the treadmill due to dyspnea. He  usually exercise for 40 minutes daily. He denies chest pain, nausea, vomiting. He is only taking 10 mg of lasix.  He is suppose to be  taking 40 mg.  Pt currently has a chest tube.    Subjective Data      Cognition  Cognition Arousal/Alertness: Awake/alert Behavior During Therapy: WFL for tasks assessed/performed Overall Cognitive Status: Within Functional Limits for tasks assessed    Balance  Static Standing Balance Static Standing - Balance Support: Bilateral upper extremity supported Static Standing - Level of Assistance: 5: Stand by assistance  End of Session PT - End of Session Equipment Utilized During Treatment: Gait belt Activity Tolerance: Patient limited by fatigue Patient left: in bed;with call bell/phone within reach Nurse Communication: Mobility status   GP     [name] 02/06/2013, 10:39 AM

## 2013-02-06 NOTE — Evaluation (Signed)
Occupational Therapy Evaluation Patient Details Name: [name] MRN: [id number] DOB: 1934/10/11 Today's Date: 02/06/2013 Time: 7829-5621 OT Time Calculation (min): 19 min  OT Assessment / Plan / Recommendation History of present illness [name] is a 78 y.o. male with PMH significant for non ischemic cardiomyopathy, systolic heart failure EF 15 % by ECHO 03-2012, CKD stage III, Cr baseline 2.4, chronic hydronephrosis, Non Hodgkin lymphoma, who presents to the ED complaining of progressive SOB on exertion for last week. He relates dyspnea on exertion, he has to stop exercising in the treadmill due to dyspnea. He usually exercise for 40 minutes daily. He denies chest pain, nausea, vomiting. He is only taking 10 mg of lasix.  He is suppose to be taking 40 mg.  [name] currently has a chest tube.   Clinical Impression   [name] demos decline in function with ADLs and ADL mobility safety with decreased strength and endurance and would benefit from acute OT services to address impairment to help restore PLOF to return home safely    OT Assessment  Patient needs continued OT Services    Follow Up Recommendations  Home health OT;Supervision/Assistance - 24 hour    Barriers to Discharge  None    Equipment Recommendations   TBD   Recommendations for Other Services    Frequency  Min 2X/week    Precautions / Restrictions Precautions Precautions: Fall Precaution Comments: chest tube on left Restrictions Weight Bearing Restrictions: No   Pertinent Vitals/Pain 6/10 L chest area with mobility    ADL  Grooming: Performed;Wash/dry hands;Wash/dry face;Minimal assistance Where Assessed - Grooming: Supported standing Upper Body Bathing: Simulated;Supervision/safety;Set up Lower Body Bathing: Simulated;Moderate assistance Upper Body Dressing: Performed;Supervision/safety;Set up Lower Body Dressing: Performed;Moderate assistance Toilet Transfer: Performed;Minimal assistance Toilet Transfer  Method: Sit to stand Toilet Transfer Equipment: Bedside commode Toileting - Clothing Manipulation and Hygiene: Performed;Minimal assistance Where Assessed - Glass blower/designer Manipulation and Hygiene: Standing Tub/Shower Transfer Method: Not assessed Equipment Used: Gait belt;Rolling walker;Other (comment) (BSC) Transfers/Ambulation Related to ADLs: cues for hand placement, [name] wants to put both hands on RW to stand up, cues for upright posture and to fully extend knees.    OT Diagnosis: Generalized weakness;Acute pain  OT Problem List: Decreased strength;Decreased knowledge of use of DME or AE;Decreased activity tolerance;Impaired balance (sitting and/or standing);Pain OT Treatment Interventions: Self-care/ADL training;Therapeutic exercise;Patient/family education;Neuromuscular education;Balance training;Therapeutic activities;DME and/or AE instruction   OT Goals(Current goals can be found in the care plan section) Acute Rehab OT Goals Patient Stated Goal: to gohome OT Goal Formulation: With patient Time For Goal Achievement: 02/13/13 Potential to Achieve Goals: Good ADL Goals [name] Will Perform Grooming: with min guard assist;standing [name] Will Perform Lower Body Bathing: with min assist [name] Will Perform Lower Body Dressing: with min assist [name] Will Transfer to Toilet: with min guard assist;ambulating;regular height toilet;bedside commode;grab bars [name] Will Perform Toileting - Clothing Manipulation and hygiene: with min guard assist;sit to/from stand Additional ADL Goal #1: [name] will participate in UE strengthening exercises with med theraband seated at EOB to increase strength and endurance for ADLs and functional tasks  Visit Information  Last OT Received On: 02/06/13 Assistance Needed: +1 History of Present Illness: [name] is a 78 y.o. male with PMH significant for non ischemic cardiomyopathy, systolic heart failure EF 15 % by ECHO 03-2012, CKD stage III, Cr baseline 2.4, chronic  hydronephrosis, Non Hodgkin lymphoma, who presents to the ED complaining of progressive SOB on exertion for last week. He relates dyspnea on exertion, he has to  stop exercising in the treadmill due to dyspnea. He usually exercise for 40 minutes daily. He denies chest pain, nausea, vomiting. He is only taking 10 mg of lasix.  He is suppose to be taking 40 mg.  [name] currently has a chest tube.       Prior Functioning     Home Living Family/patient expects to be discharged to:: Private residence Living Arrangements: Alone Available Help at Discharge: Family;Available 24 hours/day Type of Home: Apartment Home Access: Elevator Home Layout: Two level Home Equipment: Cane - single point Additional Comments: states that he uses cane at times Communication Communication: No difficulties Dominant Hand: Right         Vision/Perception Vision - History Baseline Vision: Wears glasses only for reading Patient Visual Report: No change from baseline Perception Perception: Within Functional Limits   Cognition  Cognition Arousal/Alertness: Awake/alert Behavior During Therapy: WFL for tasks assessed/performed Overall Cognitive Status: Within Functional Limits for tasks assessed    Extremity/Trunk Assessment Upper Extremity Assessment Upper Extremity Assessment: Generalized weakness;Overall [facility] for tasks assessed Cervical / Trunk Assessment Cervical / Trunk Assessment: Kyphotic     Mobility Bed Mobility Bed Mobility: Rolling Right;Right Sidelying to Sit Rolling Left: 6: Modified independent (Device/Increase time) Right Sidelying to Sit: 4: Min assist Sitting - Scoot to Edge of Bed: 5: Supervision Details for Bed Mobility Assistance: assistance with trunk to sit up and assistance to lift legs into bed Transfers Transfers: Sit to Stand;Stand to Sit Sit to Stand: 4: Min assist;From bed;From chair/3-in-1 Stand to Sit: To bed;To chair/3-in-1 Details for Transfer Assistance: cues for hand  placement, [name] wants to put both hands on RW to stand up, cues for upright posture and to fully extend knees.     Exercise     Balance Balance Balance Assessed: Yes Dynamic Sitting Balance Dynamic Sitting - Balance Support: No upper extremity supported;Feet unsupported;During functional activity Dynamic Sitting - Level of Assistance: 5: Stand by assistance Static Standing Balance Static Standing - Balance Support: Bilateral upper extremity supported Static Standing - Level of Assistance: 5: Stand by assistance Dynamic Standing Balance Dynamic Standing - Balance Support: During functional activity;Left upper extremity supported Dynamic Standing - Level of Assistance: 4: Min assist   End of Session OT - End of Session Equipment Utilized During Treatment: Gait belt;Rolling walker;Other (comment) (BSC) Activity Tolerance: Patient tolerated treatment well Patient left: in bed;with call bell/phone within reach  GO     Galen Manila 02/06/2013, 2:43 PM

## 2013-02-06 NOTE — Progress Notes (Addendum)
Clinical Social Work Department BRIEF PSYCHOSOCIAL ASSESSMENT 02/06/2013  Patient:  [name], [name]     Account Number:  [id number]     Admit date:  01/29/2013  Clinical Social Worker:  Carren Rang  Date/Time:  02/06/2013 01:37 PM  Referred by:  Physician  Date Referred:  02/06/2013 Referred for  SNF Placement   Other Referral:   Interview type:  Patient Other interview type:    PSYCHOSOCIAL DATA Living Status:  ALONE Admitted from facility:   Level of care:   Primary support name:  [name] Primary support relationship to patient:  SIBLING Degree of support available:   Patient states he has supportive daughter, [name] [phone]    CURRENT CONCERNS Current Concerns  Post-Acute Placement   Other Concerns:    SOCIAL WORK ASSESSMENT / PLAN Clinical Social Worker received referral for SNF placement at d/c. CSW introduced self and explained reason for visit. CSW explained SNF process to patient. Patient reported he is agreeable to SNF placement because he does not have supervision and lives alone. CSW will complete FL2 for MD's signature and will update family when bed offers are received.   Assessment/plan status:  Psychosocial Support/Ongoing Assessment of Needs Other assessment/ plan:   Information/referral to community resources:   SNF information    PATIENT'S/FAMILY'S RESPONSE TO PLAN OF CARE: Patient states he is agreeable to SNF placement.       Maree Krabbe, MSW, Theresia Majors [phone]

## 2013-02-06 NOTE — Progress Notes (Signed)
[facility]                                                                                Patient Demographics  [name], is a 78 y.o. male, DOB - 10/07/1934, YNW:[id number]  Admit date - 01/29/2013   Admitting Physician Alba Cory, MD  Outpatient Primary MD for the patient is ROBERTS, Vernie Ammons, MD  LOS - 8   Chief Complaint  Patient presents with  . Shortness of Breath        Assessment & Plan    Interim History:  78 y.o. male with PMH significant for non ischemic cardiomyopathy, systolic heart failure EF 15 % by ECHO 03-2012, CKD stage III, Cr baseline 2.4, chronic hydronephrosis, Non Hodgkin lymphoma, who presents to the ED complaining of progressive SOB on exertion for last week. He relates dyspnea on exertion, he has to stop exercising in the treadmill due to dyspnea. He usually exercise for 40 minutes daily. He denies chest pain, nausea, vomiting. He is only taking 10 mg of lasix. Came in with SOB, CXR and CT chest done showed left sided effusion. Thoracentesis done showing purulent material (PCCM).     Assessment/Plan:    Acute respiratory failure/Pneumonia/Pleural effusion, left/ Empyema:  - CXR continue to show expansion of lung.  - Pulmonary consult, thoracocentesis 11.10.2014 Started empirically on vanc and rocephin 11.12.2014. Antibiotics now tailored based on culture results. - He has refused VATS again. But says she'll continue to think about it. Cardiothoracic surgery has signed off on the case. -Pulmonary is following the left-sided chest tube and will likely take out the chest tube long-term oral antibiotic while patient decides on VATS procedure the    Paroxysmal a-fib:  - likely relate to procedure, his return to NSR while in ICU    Chronic systolic CHF (congestive heart failure):  - Euvolemic.  - Diuretic was initially held.  - Not a candidate for ACEi/ ARB/aldactone due to CKD and hyperkalemia.  Patient has refused more  aggressive Rx with beta blocker/nitrates/hydralazine in past. Does not want cardiac cath or consideration for ICD.  - Resumed lasix.     CKD (chronic kidney disease), stage III   - Cr baseline 2.4. He has prior history of bilateral hydronephrosis. He never follow up with urology. Repeated renal US. Interval improvement in severe bilateral hydronephrosis,  start flomax. Monitor renal function.    Severe protein caloric mal-nutritionL:  - Ensure TID.      Hyperkalemia.  Kayexalate, gentle IV fluids and repeat potassium levels in the morning.      Code Status: DNR  Family Communication:    Disposition Plan: To be decided   Procedures L chest tube   Consults  PCCM, CT Surgery, Cards,  ID Dr. Algis Liming over the phone   Medications  Scheduled Meds: .  ceFAZolin (ANCEF) IV  1 g Intravenous Q12H  . feeding supplement (ENSURE COMPLETE)  237 mL Oral QPC supper  . heparin subcutaneous  5,000 Units Subcutaneous Q8H  . polyethylene glycol  17 g Oral BID  . sodium polystyrene  30 g Rectal Once  . tamsulosin  0.4 mg Oral QPC supper   Continuous Infusions: .  sodium chloride     PRN Meds:.acetaminophen, acetaminophen, HYDROcodone-acetaminophen, nitroGLYCERIN, polyethylene glycol  DVT Prophylaxis   Heparin   Lab Results  Component Value Date   PLT 233 02/05/2013    Antibiotics     Anti-infectives   Start     Dose/Rate Route Frequency Ordered Stop   02/04/13 1500  ceFAZolin (ANCEF) IVPB 1 g/50 mL premix     1 g 100 mL/hr over 30 Minutes Intravenous Every 12 hours 02/04/13 1352     01/31/13 1934  cefUROXime (ZINACEF) 1.5 g in dextrose 5 % 50 mL IVPB  Status:  Discontinued     1.5 g 100 mL/hr over 30 Minutes Intravenous 60 min pre-op 01/31/13 1934 02/02/13 1443   01/31/13 1800  vancomycin (VANCOCIN) 500 mg in sodium chloride 0.9 % 100 mL IVPB  Status:  Discontinued     500 mg 100 mL/hr over 60 Minutes Intravenous Every 24 hours 01/31/13 1330 02/04/13 1326   01/31/13  1500  cefTRIAXone (ROCEPHIN) 2 g in dextrose 5 % 50 mL IVPB  Status:  Discontinued     2 g 100 mL/hr over 30 Minutes Intravenous Every 24 hours 01/31/13 1434 02/04/13 1326   01/31/13 1400  piperacillin-tazobactam (ZOSYN) IVPB 3.375 g  Status:  Discontinued     3.375 g 12.5 mL/hr over 240 Minutes Intravenous 3 times per day 01/31/13 1330 01/31/13 1434   01/30/13 1800  piperacillin-tazobactam (ZOSYN) IVPB 2.25 g  Status:  Discontinued     2.25 g 100 mL/hr over 30 Minutes Intravenous 3 times per day 01/30/13 1636 01/31/13 1330   01/30/13 1800  vancomycin (VANCOCIN) IVPB 750 mg/150 ml premix  Status:  Discontinued     750 mg 150 mL/hr over 60 Minutes Intravenous Every 48 hours 01/30/13 1636 01/31/13 1330          Subjective:   [name] today has, No headache, No chest pain, No abdominal pain - No Nausea, No new weakness tingling or numbness, No Cough - SOB.    Objective:   Filed Vitals:   02/05/13 2050 02/06/13 0516 02/06/13 1030 02/06/13 1327  BP: 101/62 108/62  99/59  Pulse: 109 110  105  Temp: 98.1 F (36.7 C) 98 F (36.7 C)  98.6 F (37 C)  TempSrc: Oral Oral  Oral  Resp: 18 18  18   Height:      Weight:  58.378 kg (128 lb 11.2 oz)    SpO2: 100% 98% 95% 100%    Wt Readings from Last 3 Encounters:  02/06/13 58.378 kg (128 lb 11.2 oz)  02/06/13 58.378 kg (128 lb 11.2 oz)  06/22/12 61.689 kg (136 lb)     Intake/Output Summary (Last 24 hours) at 02/06/13 1422 Last data filed at 02/06/13 1300  Gross per 24 hour  Intake    710 ml  Output   1163 ml  Net   -453 ml    Exam Awake Alert, Oriented X 3, No new F.N deficits, Normal affect .AT,PERRAL Supple Neck,No JVD, No cervical lymphadenopathy appriciated.  Symmetrical Chest wall movement, reduced breath sounds at the left base L chest Tube RRR,No Gallops,Rubs or new Murmurs, No Parasternal Heave +ve B.Sounds, Abd Soft, Non tender, No organomegaly appriciated, No rebound - guarding or rigidity. No Cyanosis,  Clubbing or edema, No new Rash or bruise      Data Review   Micro Results Recent Results (from the past 240 hour(s))  BODY FLUID CULTURE     Status: None   Collection Time  01/30/13  3:37 PM      Result Value Range Status   Specimen Description PLEURAL FLUID LEFT   Final   Special Requests 6.OML FLUID   Final   Gram Stain     Final   Value: ABUNDANT WBC PRESENT,BOTH PMN AND MONONUCLEAR     ABUNDANT GRAM POSITIVE COCCI IN CLUSTERS     Gram Stain Report Called to,Read Back By and Verified With: Gram Stain Report Called to,Read Back By and Verified With:  JAMES A @813  ON 01/31/13 BY SMIAS     Performed at [facility]   Culture     Final   Value: ABUNDANT STREPTOCOCCUS SPECIES     Note: IDENTIFIED AS STREPTOCOCCUS INTERMEDIUS     Performed at [facility]   Report Status 02/03/2013 FINAL   Final   Organism ID, Bacteria STREPTOCOCCUS SPECIES   Final  SURGICAL PCR SCREEN     Status: None   Collection Time    01/31/13  8:16 PM      Result Value Range Status   MRSA, PCR NEGATIVE  NEGATIVE Final   Staphylococcus aureus NEGATIVE  NEGATIVE Final   Comment:            The Xpert SA Assay (FDA     approved for NASAL specimens     in patients over 87 years of age),     is one component of     a comprehensive surveillance     program.  Test performance has     been validated by [employer] for patients greater     than or equal to 73 year old.     It is not intended     to diagnose infection nor to     guide or monitor treatment.    Radiology Reports Dg Chest 2 View  01/29/2013   CLINICAL DATA:  Shortness of breath, productive cough, weakness, and weight loss. There is history of coronary artery disease, CHF, and chronic renal failure.  EXAM: CHEST  2 VIEW  COMPARISON:  April 15, 2012.  FINDINGS: There is volume loss on the left likely secondary to a pleural effusion. On the right the lung is well-expanded and clear. The cardiac silhouette is top-normal in  size. The pulmonary vascularity is not engorged. There is tortuosity of the descending thoracic aorta. There is curvature of the thoracic spine with the convexity towards the right.  IMPRESSION: There has been interval increase in the size of the left pleural effusion. No significant effusion on the right is demonstrated today.   Electronically Signed   By: David  [place]   On: 01/29/2013 11:21   Ct Chest Wo Contrast  01/29/2013   CLINICAL DATA:  Increasing dyspnea.  Productive cough.  EXAM: CT CHEST WITHOUT CONTRAST  TECHNIQUE: Multidetector CT imaging of the chest was performed following the standard protocol without IV contrast.  COMPARISON:  Chest radiographs obtained earlier today and chest CT dated 03/27/2006.  FINDINGS: Interval moderate to large-sized loculated pleural effusion in the left mid and lower lung zones. There is also a suggestion of a faintly visible pleural based mass inferiorly and medially, measuring 2.5 x 1.9 cm on image number 59. There is also a probable 2nd pleural based mass medially and inferiorly, measuring 2.8 x 1.5 cm on image number 64. Visualization of these masses is somewhat limited due to the lack of intravenous contrast. There is compressive atelectasis and some parenchymal calcification medial to the  pleural fluid as well as deviation of the heart to the right. There is also patchy airspace opacity in the adjacent superior segment of the left lower lobe.  Dense proximal coronary artery calcifications are noted. Also noted is mild right lower lobe atelectasis. No enlarged lymph nodes are seen. Again demonstrated are bilateral renal cysts. These include extensive bilateral parapelvic cysts and possible chronic hydronephrosis. There are also small gallstones in the gallbladder, the largest measuring 3 mm in maximum diameter. Degenerative changes in the thoracic and cervical spine with changes of DISH. Mild to moderate dextro convex scoliosis.  IMPRESSION: 1. Moderate to  large-sized loculated left pleural fluid with possible pleural-based masses. These findings are concerning for a loculated malignant pleural effusion. Further evaluation with thoracentesis is recommended. The loculated pleural fluid is causing deviation of the heart to the right. 2. Probable pneumonia in the superior segment of the left lower lobe. 3. Bilateral lower lobe atelectasis, greater on the left. 4. Dense proximal coronary artery calcifications. 5. Cholelithiasis. 6. Chronic bilateral renal parapelvic cysts and possible UPJ obstructions.   Electronically Signed   By: Gordan Payment M.D.   On: 01/29/2013 23:08   Dg Chest Left Decubitus  01/29/2013   CLINICAL DATA:  Evaluate for layering effusion  EXAM: CHEST - LEFT DECUBITUS  COMPARISON:  Prior chest x-ray 01/29/2013  FINDINGS: No evidence of layering pleural of fluid. Otherwise, no significant interval change in the appearance of the chest compared to earlier today.  IMPRESSION: No layering fluid. The left basilar opacity may reflect a loculated pleural effusion, dense consolidation, or potentially an underlying mass. Recommend further evaluation with contrasted CT scan of the chest.   Electronically Signed   By: Malachy Moan M.D.   On: 01/29/2013 15:47   US Renal  01/29/2013   CLINICAL DATA:  Followup hydronephrosis  EXAM: RENAL/URINARY TRACT ULTRASOUND COMPLETE  COMPARISON:  04/16/2012  FINDINGS: Right Kidney  Length: 10.4 cm. Echogenicity is within normal limits. There is been significant improvement in right hydronephrosis with residual wall pelvocaliectasis. Cyst within the inferior pole measures 10 x 7 x 8 mm.  Left Kidney  Length: Measures 10.7 cm. Echogenicity is within normal limits. There is moderate left hydronephrosis. This is improved when compared with the previous exam. Cyst within the upper pole measures 1.4 x 1.7 x 1.5 cm.  Bladder  Appears normal for degree of bladder distention.  Other:   The prostate gland is enlarged measuring  5 x 4.8 x 5.8 cm  IMPRESSION: 1. Interval improvement in severe bilateral hydronephrosis. There is residual right pelvocaliectasis and moderate left hydronephrosis. 2. Bilateral renal cysts. 3. Prostate gland enlargement.   Electronically Signed   By: Signa Kell M.D.   On: 01/29/2013 17:06   Dg Chest Port 1 View  02/06/2013   CLINICAL DATA:  Assess left lower lobe airspace disease  EXAM: PORTABLE CHEST - 1 VIEW  COMPARISON:  02/05/2013  FINDINGS: Left thoracostomy tube in stable position. Left base opacification, both consolidation and partly loculated pleural fluid are stable. Loculated left base pneumothorax unchanged as well. The right lung remains well aerated. No appreciable change in cardiac enlargement. The ascending aorta is tortuous.  IMPRESSION: 1. Unchanged loculated left base hydropneumothorax. 2. Left lower lobe pneumonia or atelectasis is unchanged. 3. Unchanged positioning of left chest tube.   Electronically Signed   By: Tiburcio Pea M.D.   On: 02/06/2013 07:03   Dg Chest Port 1 View  02/05/2013   CLINICAL DATA:  78 year old male  with left diffusion, pleural nodularity. Initial encounter.  EXAM: PORTABLE CHEST - 1 VIEW  COMPARISON:  02/04/2013 and earlier.  FINDINGS: Portable AP semi upright view at 0657 hrs. Stable left chest tube, projecting in the mid left lung. Suspect a residual component of more caudal loculated fluid. Overall stable ventilation. No pneumothorax identified. Stable cardiac size and mediastinal contours. Of the right lung remains grossly clear.  IMPRESSION: Stable ventilation. Left chest tube in place just below the level of the left hilum. Residual lung base effusion.   Electronically Signed   By: Augusto Gamble M.D.   On: 02/05/2013 08:02   Dg Chest Port 1 View  02/04/2013   CLINICAL DATA:  Followup effusion and chest tube  EXAM: PORTABLE CHEST - 1 VIEW  COMPARISON:  02/02/2013  FINDINGS: Loculated pleural fluid on the left with focal loculated left lower lateral  pleural air is stable. The position of the left chest tube is also stable. No apical pneumothorax.  Right lung is clear.  IMPRESSION: Stable appearance from the prior exam. Persistent loculated left inferolateral hydro pneumothorax. Left chest tube is stable.   Electronically Signed   By: Amie Portland M.D.   On: 02/04/2013 07:33   Dg Chest Port 1 View  02/02/2013   CLINICAL DATA:  Left-sided hydro pneumothorax.  EXAM: PORTABLE CHEST - 1 VIEW  COMPARISON:  One-view chest 02/01/2013.  CT chest 01/29/2013.  FINDINGS: The left-sided chest tube is in place. The loculated pleural fluid collection continues to decrease in size. The amount of air in the collection is decreased as well. Left lower lobe airspace versus mass persists. The heart remains enlarged. The right lung is clear.  IMPRESSION: 1. Continued decrease and left-sided hydro pneumothorax. 2. Continued soft tissue density in the left lower lobe compatible with airspace disease or mass lesion. 3. Persistent cardiomegaly without failure.   Electronically Signed   By: Gennette Pac M.D.   On: 02/02/2013 07:47   Dg Chest Port 1 View  02/01/2013   CLINICAL DATA:  Respiratory failure. Left pleural fluid chest tube drainage.  EXAM: PORTABLE CHEST - 1 VIEW  COMPARISON:  01/30/2013.  CT chest 01/29/2013.  FINDINGS: Large bore chest tube is noted in stable position of the left lower chest. Previously identified left base pleural fluid and air collection remains. The air collection is diminished in size from prior exam. Mild left perihilar atelectasis and infiltrate noted on today's exam. Right lung is clear. Cardiomegaly. No pulmonary venous congestion.  IMPRESSION: 1. Large bore chest tube is noted over left lower pleural space in stable position. The left base pleural fluid and air collection remain. Air in the left pleural cavity has diminished on today's exam. 2. Mild left perihilar infiltrate.   Electronically Signed   By: Maisie Fus  Register   On: 02/01/2013  07:48   Dg Chest Port 1 View  01/30/2013   CLINICAL DATA:  Status post left-sided chest tube insertion.  EXAM: PORTABLE CHEST - 1 VIEW  COMPARISON:  CT scan of chest dated January 29, 2013.  FINDINGS: A large caliber chest tube is been placed on the left with the tip of the tube to lie between the posterior aspects of the left 8 and 9th ribs medially. There has been interval improvement in the opacity of the left mid and lower hemithorax consistent with drainage of pleural fluid. There is pleural space air now associated with residual fluid and thickened pleura. There is no mediastinal shift.  The cardiac silhouette is top-normal  in size. The pulmonary vascularity is not engorged. There is tortuosity of the descending thoracic aorta.  IMPRESSION: The patient has undergone interval drainage of a large amount of of fluid from the left pleural space by placement of a large caliber chest tube. There is residual soft tissue density present and there is pleural space air visible in the lower hemithorax. No apical pneumothorax is demonstrated.   Electronically Signed   By: David  [place]   On: 01/30/2013 16:47    CBC  Recent Labs Lab 01/31/13 0518 01/31/13 1955 02/01/13 0545 02/05/13 0527  WBC 7.9 6.5 5.4 5.8  HGB 10.2* 9.4* 9.1* 8.8*  HCT 31.6* 29.3* 28.5* 28.2*  PLT 316 267 281 233  MCV 85.4 85.4 84.6 87.3  MCH 27.6 27.4 27.0 27.2  MCHC 32.3 32.1 31.9 31.2  RDW 15.8* 16.0* 15.6* 16.3*    Chemistries   Recent Labs Lab 01/31/13 1955 02/01/13 0545 02/05/13 0527 02/05/13 1720 02/06/13 1308  NA 135 139 137 134* 137  K 5.2* 5.1 6.2* 5.8* 5.6*  CL 105 109 106 103 104  CO2 21 21 26 23 26   GLUCOSE 127* 86 109* 130* 149*  BUN 54* 51* 48* 46* 45*  CREATININE 2.14* 2.03* 1.98* 1.93* 2.14*  CALCIUM 8.3* 8.3* 8.8 8.8 8.4  AST 30 18  --   --   --   ALT 17 15  --   --   --   ALKPHOS 70 68  --   --   --   BILITOT 0.2* 0.3  --   --   --     ------------------------------------------------------------------------------------------------------------------ estimated creatinine clearance is 24.3 ml/min (by C-G formula based on Cr of 2.14). ------------------------------------------------------------------------------------------------------------------ No results found for this basename: HGBA1C,  in the last 72 hours ------------------------------------------------------------------------------------------------------------------ No results found for this basename: CHOL, HDL, LDLCALC, TRIG, CHOLHDL, LDLDIRECT,  in the last 72 hours ------------------------------------------------------------------------------------------------------------------ No results found for this basename: TSH, T4TOTAL, FREET3, T3FREE, THYROIDAB,  in the last 72 hours ------------------------------------------------------------------------------------------------------------------ No results found for this basename: VITAMINB12, FOLATE, FERRITIN, TIBC, IRON, RETICCTPCT,  in the last 72 hours  Coagulation profile  Recent Labs Lab 01/31/13 1955 02/01/13 0545  INR 1.41 1.41    No results found for this basename: DDIMER,  in the last 72 hours  Cardiac Enzymes No results found for this basename: CK, CKMB, TROPONINI, MYOGLOBIN,  in the last 168 hours ------------------------------------------------------------------------------------------------------------------ No components found with this basename: POCBNP,      Time Spent in minutes  35   Sanjith Siwek K M.D on 02/06/2013 at 2:22 PM  Between 7am to 7pm - Pager - [phone]  After 7pm go to [email or web address] - password [email or web address]  And look for the night coverage person covering for me after hours  [facility] Office  [phone]

## 2013-02-06 NOTE — Progress Notes (Signed)
TELEMETRY: Reviewed telemetry pt in NSR with rate 105 bpm, Occ. PACs,PVCs: Filed Vitals:   02/05/13 1100 02/05/13 1300 02/05/13 2050 02/06/13 0516  BP:  93/53 101/62 108/62  Pulse:  102 109 110  Temp:  98.1 F (36.7 C) 98.1 F (36.7 C) 98 F (36.7 C)  TempSrc:  Oral Oral Oral  Resp:  18 18 18   Height:      Weight: 124 lb 4.8 oz (56.382 kg)   128 lb 11.2 oz (58.378 kg)  SpO2:  100% 100% 98%    Intake/Output Summary (Last 24 hours) at 02/06/13 0643 Last data filed at 02/06/13 0517  Gross per 24 hour  Intake    780 ml  Output   1103 ml  Net   -323 ml    SUBJECTIVE Breathing is  better. No chest pain or orthopnea. Denies any palpitations.  LABS: Basic Metabolic Panel:  Recent Labs  40/98/11 0527 02/05/13 1720  NA 137 134*  K 6.2* 5.8*  CL 106 103  CO2 26 23  GLUCOSE 109* 130*  BUN 48* 46*  CREATININE 1.98* 1.93*  CALCIUM 8.8 8.8   CBC:  Recent Labs  02/05/13 0527  WBC 5.8  HGB 8.8*  HCT 28.2*  MCV 87.3  PLT 233   BNP: 11562   Radiology/Studies:  CLINICAL DATA: Followup effusion and chest tube  EXAM: PORTABLE CHEST - 1 VIEW  COMPARISON: 02/02/2013  FINDINGS: Loculated pleural fluid on the left with focal loculated left lower lateral pleural air is stable. The position of the left chest tube is also stable. No apical pneumothorax.  Right lung is clear.  IMPRESSION: Stable appearance from the prior exam. Persistent loculated left inferolateral hydro pneumothorax. Left chest tube is stable.   Electronically Signed By: Amie Portland M.D. On: 02/04/2013 07:33   Pleural culture: Streptococcus intermedius    PHYSICAL EXAM General: Well developed, thin, in no acute distress. Head: Normal Neck: Negative for carotid bruits. JVD not elevated. Lungs: Diminished BS left base. Chest tube in place. Heart: IRRR S1 S2 without murmurs, rubs, or gallops.  Abdomen: Soft, non-tender, non-distended with normoactive bowel sounds. No hepatomegaly.    Msk:  Strength and tone appears normal for age. Extremities: No clubbing, cyanosis or edema.  Distal pedal pulses are 2+ and equal bilaterally. Neuro: Alert and oriented X 3. Moves all extremities spontaneously. Psych:  Responds to questions appropriately with a normal affect.  ASSESSMENT AND PLAN: 1. Chronic systolic CHF-EF 20%- appears well compensated. Agree with holding diuretics now. Not a candidate for ACEi/ ARB/aldactone due to CKD and hyperkalemia. Patient has refused more aggressive Rx with beta blocker/nitrates/hydralazine in past. Does not want cardiac cath or consideration for ICD.  2. Left pleural effusion. Thoracentesis consistent with empyema. Chest tube now in place. On IV antibiotics. Growing streptococcus sp. Refused VATS 3. CKD stage III- stqable 4. Moderate MR, mild-moderate AI 5. NSVT-asymptomatic 6. Remote history of non- Hodgkins lymphoma. 7. Paroxysmal AFib/flutter. Likely related to instrumentation with chest tube and empyema. Now converted to NSR. Metoprolol stopped due to hypotension. Will discontinue amiodarone and observe.   Principal Problem:   Pleural effusion, left Active Problems:   Aortic insufficiency   CKD (chronic kidney disease), stage III   Acute systolic CHF (congestive heart failure)   Bilateral hydronephrosis   Cardiomyopathy, ischemic   CAD (coronary artery disease)   CAP (community acquired pneumonia)   Empyema   Paroxysmal a-fib   Acute respiratory failure   Trapped lung   Protein-calorie malnutrition,  severe   Atrial fibrillation    Signed, Peter [place] MD,FACC 02/06/2013 6:43 AM

## 2013-02-06 NOTE — Progress Notes (Addendum)
PULMONARY  / CRITICAL CARE MEDICINE  Name: [name] MRN: [id number] DOB: 1935/02/28    ADMISSION DATE:  01/29/2013 CONSULTATION DATE:  01/30/13   REFERRING MD :  [facility] PRIMARY SERVICE:  [facility] Primary MD:  Zenaida Deed  CHIEF COMPLAINT:  SOB  BRIEF PATIENT DESCRIPTION: 78 y/o with PMH of CHF, CKD, ischemic cardiomyopathy, and h/o Non hodgkins lymphoma admitted by [facility] 11/11 for 1 week history of worsening SOB and productive cough.  CT scan of chest w/o contrast showed large left pleural effusion w/ possible loculation and two poorly visualized masses.  L CT placed with strep in fluid.    SIGNIFICANT EVENTS / STUDIES:  11/11 - Admitted shortness of breath 11/11 - CT chest w/o contrast >> Large left pleural effusion, possible loculations and right sided deviation of the heart, difficult to visualize masses likely pleural based 11/12 - Left Thoracentesis >>> 1L pus, cytol=neg 11/12 - CT placed  11/13 - TCTS consult for surgical drainage and mgmt of trapped lung. Pt refused VATS 11/14 - Some improvement/ re-expansion of LLL 11/15 - CXR stable resid L effus, atx, tube ok 11/18 - minimal CT drainage  TUBES/LINES: 11/12 L Chest tube>>   CULTURES: 11/12 pleural fluid >> abundant strep>>PCN sens  ANTIBIOTICS: Zosyn 11/12>> 11/13 Vancomycin 11/12 >> 11/17 Ceftriaxone 11/13 >> 11/17 11/17 cefazolin >>   SUBJECTIVE: No acute events.  Moist cough.     VITAL SIGNS: Temp:  [98 F (36.7 C)-98.1 F (36.7 C)] 98 F (36.7 C) (11/19 0516) Pulse Rate:  [102-110] 110 (11/19 0516) Resp:  [18] 18 (11/19 0516) BP: (93-108)/(53-62) 108/62 mmHg (11/19 0516) SpO2:  [98 %-100 %] 98 % (11/19 0516) Weight:  [124 lb 4.8 oz (56.382 kg)-128 lb 11.2 oz (58.378 kg)] 128 lb 11.2 oz (58.378 kg) (11/19 0516)  PHYSICAL EXAMINATION: General:  Thin cachectic, weak Neuro: No focal deficits HEENT: WNL Cardiovascular:  RRR gr 1/6 m at LSB  Lungs: clear anteriorly, decr BS on left, L chest tube in  position Abdomen:  +BS, soft, non-tender Ext: No edema  LABS   Recent Labs Lab 01/31/13 1955 02/01/13 0545 02/05/13 0527  HGB 9.4* 9.1* 8.8*  HCT 29.3* 28.5* 28.2*  WBC 6.5 5.4 5.8  PLT 267 281 233    Recent Labs Lab 01/31/13 1012 01/31/13 1955 02/01/13 0545 02/05/13 0527 02/05/13 1720  NA 135 135 139 137 134*  K 5.8* 5.2* 5.1 6.2* 5.8*  CL 107 105 109 106 103  CO2 19 21 21 26 23   GLUCOSE 138* 127* 86 109* 130*  BUN 57* 54* 51* 48* 46*  CREATININE 1.96* 2.14* 2.03* 1.98* 1.93*  CALCIUM 8.5 8.3* 8.3* 8.8 8.8   CXR - 11/19 stable LLL loculated effusion, CT in good position - unchanged  ASSESSMENT:  Left Empyema - Cytology- neg (no malig cells, + acute inflamm).  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  See comments below.  -Has refused other Rx as well (cath etc).  Recommend Palliative Care involvement  Patient indicates he would consider a VATS but feels he is too weak to undergo a surgery at this time.  He feels as though he needs PT to "be stronger for the surgery".  Informed him the longer he waits the weaker he will become.  Discussed options for aggressive medical care up to the point of VATS but that would also involve palliative measures when he declined.  He states he would consider surgery  on Monday after some PT but I am concerned this is indecision.  Likely best plan is to remove CT either way (has had minimal drainage and has had adaquate drainage with combined abx as likely needed) & continue ABX. As chest tube fxn in question as well, no role to keep this in. Left base aeration has improved daily. Has remained on suciton? Unclear ? Water seal, pcxr in 4 hrs and in am , if no ptx, dc   Canary Brim, NP-C [facility] Pgr: [phone] or [phone]   I have fully examined this patient and agree with above findings.    And edited infull  Mcarthur Rossetti. Tyson Alias, MD,  FACP Pgr: [phone] [facility]

## 2013-02-06 NOTE — Progress Notes (Signed)
Chaplain provided emotional and spiritual support for the patient. Chaplain provided active/reflective listening for the patient's feelings as he discussed his family, his faith and the decisions he is in the process of making concerning his health. Chaplain provided an emphatic presence as the patient articulated his religious/cultural practices/values which might impact his care. Chaplain will follow up as needed or requested.  02/06/13 1600  Clinical Encounter Type  Visited With Patient;Health care provider  Visit Type Initial;Spiritual support;Social support  Stress Factors  Patient Stress Factors Health changes;Major life changes

## 2013-02-07 ENCOUNTER — Inpatient Hospital Stay (HOSPITAL_COMMUNITY): Payer: Medicare Other

## 2013-02-07 LAB — BASIC METABOLIC PANEL
Calcium: 8.1 mg/dL — ABNORMAL LOW (ref 8.4–10.5)
Chloride: 107 mEq/L (ref 96–112)
GFR calc Af Amer: 33 mL/min — ABNORMAL LOW (ref >90)
GFR calc non Af Amer: 28 mL/min — ABNORMAL LOW (ref >90)
Sodium: 140 mEq/L (ref 135–145)

## 2013-02-07 NOTE — Progress Notes (Addendum)
Occupational Therapy Treatment Patient Details Name: [name] MRN: [id number] DOB: [birth year] Today's Date: 02/07/2013 Time: 6213-0865 OT Time Calculation (min): 25 min  OT Assessment / Plan / Recommendation  History of present illness [name] is a 78 y.o. male with PMH significant for non ischemic cardiomyopathy, systolic heart failure EF 15 % by ECHO 03-2012, CKD stage III, Cr baseline 2.4, chronic hydronephrosis, Non Hodgkin lymphoma, who presents to the ED complaining of progressive SOB on exertion for last week. He relates dyspnea on exertion, he has to stop exercising in the treadmill due to dyspnea. He usually exercise for 40 minutes daily. He denies chest pain, nausea, vomiting. He is only taking 10 mg of lasix.  He is suppose to be taking 40 mg.  Pt currently has a chest tube.   OT comments  Pt making good progress with functional goals and should continue with acute OT services to increase level of function and safety.Pt now plans to d/c to a SNF for short term rehab before returning home.   Follow Up Recommendations  SNF;Supervision/Assistance - 24 hour    Barriers to Discharge   none    Equipment Recommendations  None recommended by OT;Other (comment) (TBD at SNF level of care)    Recommendations for Other Services    Frequency Min 2X/week   Progress towards OT Goals Progress towards OT goals: Progressing toward goals  Plan      Precautions / Restrictions Precautions Precautions: Fall Restrictions Weight Bearing Restrictions: No   Pertinent Vitals/Pain No c/o pain    ADL  Grooming: Performed;Wash/dry hands;Wash/dry face;Minimal assistance;Min guard Lower Body Bathing: Performed;Minimal assistance;Moderate assistance Lower Body Dressing: Performed;Minimal assistance Toilet Transfer: Performed;Min guard;Minimal assistance Toilet Transfer Method: Sit to stand Toilet Transfer Equipment: Regular height toilet;Grab bars Toileting - Clothing Manipulation  and Hygiene: Min guard Where Assessed - Glass blower/designer Manipulation and Hygiene: Standing Equipment Used: Gait belt;Rolling walker Transfers/Ambulation Related to ADLs: cues for correct hand placement    OT Diagnosis:    OT Problem List:   OT Treatment Interventions:     OT Goals(current goals can now be found in the care plan section) Acute Rehab OT Goals Patient Stated Goal: to go home after rehab at SNF  Visit Information  Last OT Received On: 02/07/13 Assistance Needed: +1 History of Present Illness: [name] is a 78 y.o. male with PMH significant for non ischemic cardiomyopathy, systolic heart failure EF 15 % by ECHO 03-2012, CKD stage III, Cr baseline 2.4, chronic hydronephrosis, Non Hodgkin lymphoma, who presents to the ED complaining of progressive SOB on exertion for last week. He relates dyspnea on exertion, he has to stop exercising in the treadmill due to dyspnea. He usually exercise for 40 minutes daily. He denies chest pain, nausea, vomiting. He is only taking 10 mg of lasix.  He is suppose to be taking 40 mg.  Pt currently has a chest tube.    Subjective Data      Prior Functioning       Cognition  Cognition Arousal/Alertness: Awake/alert Behavior During Therapy: WFL for tasks assessed/performed Overall Cognitive Status: Within Functional Limits for tasks assessed    Mobility  Bed Mobility Bed Mobility: Supine to Sit;Sitting - Scoot to [place] of Bed;Sit to Supine;Scooting to [facility] Supine to Sit: 5: Supervision;With rails Sitting - Scoot to Edge of Bed: 5: Supervision Sit to Supine: 5: Supervision Scooting to [facility]: 5: Supervision;With rail Transfers Transfers: Sit to Stand;Stand to Sit Sit to Stand: 4: Min guard;From bed;From toilet  Stand to Sit: To bed;To toilet;4: Min guard Details for Transfer Assistance: cues for hand placement    Exercises   Pt provided with orange theraband (med) for UB (shoulder/forearm) strengthening exercises Pt participated at  EOB with OT instruction 3 sets 5 reps   Balance Dynamic Sitting Balance Dynamic Sitting - Balance Support: No upper extremity supported;Feet unsupported;During functional activity Dynamic Sitting - Level of Assistance: 6: Modified independent (Device/Increase time) Dynamic Standing Balance Dynamic Standing - Balance Support: During functional activity;Left upper extremity supported Dynamic Standing - Level of Assistance: 4: Min assist   End of Session OT - End of Session Equipment Utilized During Treatment: Rolling walker;Gait belt Activity Tolerance: Patient tolerated treatment well Patient left: with call bell/phone within reach;in chair  GO     Galen Manila 02/07/2013, 12:44 PM

## 2013-02-07 NOTE — Progress Notes (Signed)
ANTIBIOTIC CONSULT NOTE - follow up Pharmacy Consult for ancef  Indication: L empyema  Allergies  Allergen Reactions  . Contrast Media [Iodinated Diagnostic Agents] Other (See Comments)    Patient does not want dye because it is hard on his kidneys  . Prednisone Rash  . Shellfish Allergy Itching and Rash    Crab meat    Patient Measurements: Height: 6' (182.9 cm) Weight: 130 lb 3.2 oz (59.058 kg) IBW/kg (Calculated) : 77.6  Vital Signs: Temp: 97.9 F (36.6 C) (11/20 0450) Temp src: Oral (11/20 0450) BP: 95/51 mmHg (11/20 0450) Pulse Rate: 78 (11/20 0450) Intake/Output from previous day: 11/19 0701 - 11/20 0700 In: 1740 [P.O.:840; I.V.:900] Out: 1083 [Urine:950; Stool:3; Chest Tube:130] Intake/Output from this shift:    Labs:  Recent Labs  02/05/13 0527 02/05/13 1720 02/06/13 1308 02/07/13 0510  WBC 5.8  --   --   --   HGB 8.8*  --   --   --   PLT 233  --   --   --   CREATININE 1.98* 1.93* 2.14* 2.14*   Estimated Creatinine Clearance: 24.5 ml/min (by C-G formula based on Cr of 2.14). No results found for this basename: VANCOTROUGH, VANCOPEAK, VANCORANDOM, GENTTROUGH, GENTPEAK, GENTRANDOM, TOBRATROUGH, TOBRAPEAK, TOBRARND, AMIKACINPEAK, AMIKACINTROU, AMIKACIN,  in the last 72 hours    Assessment: 78 yr old male admitted with 1 week history of worsening SOB and productive cough. CT showed large pleural effusion and  PNA/empyema. He underwent a thoracentesis with chest tubes placed and has was started on empiric antibiotics.  Cultures now show PCN sensitive streptococcus on on ancef.  SCr= 2.14 and CrCl ~ 25.   Zosyn 11/12>>11/13 Vancomycin 11/12>> 11/17 CTX 11/13>> 11/17  11/12 Pleural fluid cx - streptococcus (sens to PCN)   Plan:  -Continue ancef 1gm IV q12h -Will follow renal function and plans for length of therapy  Harland German, Pharm D 02/07/2013 9:00 AM

## 2013-02-07 NOTE — Progress Notes (Signed)
NUTRITION FOLLOW UP  DOCUMENTATION CODES Per approved criteria  -Severe malnutrition in the context of chronic illness -Underweight   Intervention:    Continue Ensure Complete daily (350 kcals, 13 gm protein per 8 fl oz bottle) RD to follow for nutrition care plan  Nutrition Dx:   Increased nutrient needs related to wound healing, malnutrition as evidenced by estimated nutrition needs, ongoing  Goal:   Pt to meet >/= 90% of their estimated nutrition needs   Monitor:   PO & supplemental intake, weight, labs, I/O's  Assessment:   Patient with PMH of systolic HF, non ischemic cardiomyopathy, CKD, chronic hydronephrosis and non Hodgkin lymphoma who presened to ED complaining of progressive shortness of breath on exertion.  Palliative Care Team consult note reviewed 11/15.  Patient disappointed with hospital food.  PO intake variable at 50-75% per flowsheet records.  Ensure Complete supplement ordered daily after supper.  Disposition: SNF.  Height: Ht Readings from Last 1 Encounters:  01/31/13 6' (1.829 m)    Weight Status:   Wt Readings from Last 1 Encounters:  02/07/13 130 lb 3.2 oz (59.058 kg)    Body mass index is 17.65 kg/(m^2).  Re-estimated needs:  Kcal: 1600-1800 Protein: 75-85 gm Fluid: 1.6-1.8 L  Skin: Stage II pressure ulcer to buttocks  Diet Order: Cardiac   Intake/Output Summary (Last 24 hours) at 02/07/13 1543 Last data filed at 02/07/13 0944  Gross per 24 hour  Intake   1620 ml  Output    581 ml  Net   1039 ml    Labs:   Recent Labs Lab 02/05/13 1720 02/06/13 1308 02/07/13 0510  NA 134* 137 140  K 5.8* 5.6* 5.1  CL 103 104 107  CO2 23 26 27   BUN 46* 45* 42*  CREATININE 1.93* 2.14* 2.14*  CALCIUM 8.8 8.4 8.1*  GLUCOSE 130* 149* 80    Scheduled Meds: .  ceFAZolin (ANCEF) IV  1 g Intravenous Q12H  . feeding supplement (ENSURE COMPLETE)  237 mL Oral QPC supper  . heparin subcutaneous  5,000 Units Subcutaneous Q8H  . polyethylene  glycol  17 g Oral BID  . tamsulosin  0.4 mg Oral QPC supper    Continuous Infusions:   Maureen Chatters, RD, LDN Pager #: [phone] After-Hours Pager #: [phone]

## 2013-02-07 NOTE — Progress Notes (Signed)
CSW attempted to call patient's brother, as asked by patient. Phone continued to ring, no voicemail. CSW will attempt to call later or try tomorrow.   Maree Krabbe, MSW, [facility] [phone]

## 2013-02-07 NOTE — Progress Notes (Signed)
TELEMETRY: Reviewed telemetry pt in NSR with rate 105 bpm, Occ. PACs,PVCs: Filed Vitals:   02/06/13 1030 02/06/13 1327 02/06/13 1936 02/07/13 0450  BP:  99/59 100/54 95/51  Pulse:  105 85 78  Temp:  98.6 F (37 C) 98.3 F (36.8 C) 97.9 F (36.6 C)  TempSrc:  Oral Oral Oral  Resp:  18 18 18   Height:      Weight:    130 lb 3.2 oz (59.058 kg)  SpO2: 95% 100% 99% 93%    Intake/Output Summary (Last 24 hours) at 02/07/13 0830 Last data filed at 02/07/13 0654  Gross per 24 hour  Intake   1620 ml  Output   1082 ml  Net    538 ml    SUBJECTIVE Breathing is  better. No chest pain or orthopnea. Denies any palpitations.  LABS: Basic Metabolic Panel:  Recent Labs  46/96/29 1308 02/07/13 0510  NA 137 140  K 5.6* 5.1  CL 104 107  CO2 26 27  GLUCOSE 149* 80  BUN 45* 42*  CREATININE 2.14* 2.14*  CALCIUM 8.4 8.1*   CBC:  Recent Labs  02/05/13 0527  WBC 5.8  HGB 8.8*  HCT 28.2*  MCV 87.3  PLT 233   BNP: 11562   Radiology/Studies:  CLINICAL DATA: Followup effusion and chest tube  EXAM: PORTABLE CHEST - 1 VIEW  COMPARISON: 02/02/2013  FINDINGS: Loculated pleural fluid on the left with focal loculated left lower lateral pleural air is stable. The position of the left chest tube is also stable. No apical pneumothorax.  Right lung is clear.  IMPRESSION: Stable appearance from the prior exam. Persistent loculated left inferolateral hydro pneumothorax. Left chest tube is stable.   Electronically Signed By: Amie Portland M.D. On: 02/04/2013 07:33   Pleural culture: Streptococcus intermedius    PHYSICAL EXAM General: Well developed, thin, in no acute distress. Head: Normal Neck: Negative for carotid bruits. JVD not elevated. Lungs: Diminished BS left base. Chest tube in place. Heart: IRRR S1 S2 without murmurs, rubs, or gallops.  Abdomen: Soft, non-tender, non-distended with normoactive bowel sounds. No hepatomegaly.  Msk:  Strength and tone appears  normal for age. Extremities: No clubbing, cyanosis or edema.  Distal pedal pulses are 2+ and equal bilaterally. Neuro: Alert and oriented X 3. Moves all extremities spontaneously. Psych:  Responds to questions appropriately with a normal affect.  ASSESSMENT AND PLAN: 1. Chronic systolic CHF-EF 20%- appears well compensated. Agree with holding diuretics now. Prior to admission was taking lasix prn. Not a candidate for ACEi/ ARB/aldactone due to CKD and hyperkalemia. Patient has refused more aggressive Rx with beta blocker/nitrates/hydralazine in past. Does not want cardiac cath or consideration for ICD.  2. Left pleural effusion. Thoracentesis consistent with empyema. Chest tube now in place. On IV antibiotics. Growing streptococcus sp. Refused VATS 3. CKD stage III- stqable 4. Moderate MR, mild-moderate AI 5. NSVT-asymptomatic 6. Remote history of non- Hodgkins lymphoma. 7. Paroxysmal AFib/flutter. Likely related to instrumentation with chest tube and empyema. Now converted to NSR. Metoprolol stopped due to hypotension. Off amiodarone.  No further cardiac recommendations at this point. I will sign off. Please call with further questions.   Principal Problem:   Pleural effusion, left Active Problems:   Aortic insufficiency   CKD (chronic kidney disease), stage III   Acute systolic CHF (congestive heart failure)   Bilateral hydronephrosis   Cardiomyopathy, ischemic   CAD (coronary artery disease)   CAP (community acquired pneumonia)   Empyema  Paroxysmal a-fib   Acute respiratory failure   Trapped lung   Protein-calorie malnutrition, severe   Atrial fibrillation    Signed, Peter [place] MD,FACC 02/07/2013 8:30 AM

## 2013-02-07 NOTE — Progress Notes (Signed)
Clinical Social Worker provided patient with skilled nursing facility bed offers. Patient appears open to SNF option and gave CSW permission to follow up with [facility] "because it is close to home." CSW followed up with [facility] and has confirmed a bed. DNR form on patient's chart for MD signature.  Maree Krabbe, MSW, [facility] [phone]

## 2013-02-07 NOTE — Progress Notes (Signed)
Update: Chaplain spoke to patient. Patient previously requested a HCPOA. Patient said, "he thought about it further and does not wish to proceed with an advance directive."

## 2013-02-07 NOTE — Progress Notes (Signed)
[facility]                                                                                Patient Demographics  [name], is a 78 y.o. male, DOB - September 18, 1934, ZOX:[id number]  Admit date - 01/29/2013   Admitting Physician [name] Cory, MD  Outpatient Primary MD for the patient is ROBERTS, Vernie Ammons, MD  LOS - 9   Chief Complaint  Patient presents with  . Shortness of Breath        Assessment & Plan    Interim History:    78 y.o. male with PMH significant for non ischemic cardiomyopathy, systolic heart failure EF 15 % by ECHO 03-2012, CKD stage III, Cr baseline 2.4, chronic hydronephrosis, Non Hodgkin lymphoma, who presents to the ED complaining of progressive SOB on exertion for last week. He relates dyspnea on exertion, he has to stop exercising in the treadmill due to dyspnea. He usually exercise for 40 minutes daily. He denies chest pain, nausea, vomiting. He is only taking 10 mg of lasix. Came in with SOB, CXR and CT chest done showed left sided effusion. Thoracentesis done showing purulent material (PCCM).      Assessment/Plan:    Acute respiratory failure/Pneumonia/Pleural effusion, left/ Empyema:   - CXR continue to show expansion of lung.  - Pulmonary consult, thoracocentesis 11.10.2014 Started empirically on vanc and rocephin 11.12.2014. Antibiotics now tailored based on culture results. - He has refused VATS again. But says she'll continue to think about it. Cardiothoracic surgery has signed off on the case. -Pulmonary is following the left-sided chest tube , chest tube was removed by pulmonary on 02/07/2013 around 11 AM, will repeat x-ray, per pulmonary the patient to be kept 24 hours for monitoring. We'll repeat chest x-ray again in the morning if stable will be discharged on 4 more weeks of oral antibiotics.     Paroxysmal a-fib:  - likely relate to procedure, his return to NSR while in ICU     Chronic systolic CHF (congestive heart  failure):  - Euvolemic.  - Diuretic was initially held.  - Not a candidate for ACEi/ ARB/aldactone due to CKD and hyperkalemia.  Patient has refused more aggressive Rx with beta blocker/nitrates/hydralazine in past. Does not want cardiac cath or consideration for ICD.  - Resumed lasix.      CKD (chronic kidney disease), stage III   - Cr baseline 2.4. He has prior history of bilateral hydronephrosis. He never follow up with urology. Repeated renal US. Interval improvement in severe bilateral hydronephrosis,  start flomax. Monitor renal function.     Severe protein caloric mal-nutritionL:  - Ensure TID.      Hyperkalemia.  Kayexalate, gentle IV fluids and repeat potassium levels in the morning.      Code Status: DNR  Family Communication:    Disposition Plan: SNF   Procedures L chest tube   Consults  PCCM, CT Surgery, Cards,  ID Dr. Algis Liming over the phone   Medications  Scheduled Meds: .  ceFAZolin (ANCEF) IV  1 g Intravenous Q12H  . feeding supplement (ENSURE COMPLETE)  237 mL Oral QPC supper  . heparin subcutaneous  5,000 Units  Subcutaneous Q8H  . polyethylene glycol  17 g Oral BID  . tamsulosin  0.4 mg Oral QPC supper   Continuous Infusions: . sodium chloride 75 mL/hr at 02/07/13 0500   PRN Meds:.acetaminophen, acetaminophen, HYDROcodone-acetaminophen, nitroGLYCERIN, polyethylene glycol  DVT Prophylaxis   Heparin   Lab Results  Component Value Date   PLT 233 02/05/2013    Antibiotics     Anti-infectives   Start     Dose/Rate Route Frequency Ordered Stop   02/04/13 1500  ceFAZolin (ANCEF) IVPB 1 g/50 mL premix     1 g 100 mL/hr over 30 Minutes Intravenous Every 12 hours 02/04/13 1352     01/31/13 1934  cefUROXime (ZINACEF) 1.5 g in dextrose 5 % 50 mL IVPB  Status:  Discontinued     1.5 g 100 mL/hr over 30 Minutes Intravenous 60 min pre-op 01/31/13 1934 02/02/13 1443   01/31/13 1800  vancomycin (VANCOCIN) 500 mg in sodium chloride 0.9 % 100 mL  IVPB  Status:  Discontinued     500 mg 100 mL/hr over 60 Minutes Intravenous Every 24 hours 01/31/13 1330 02/04/13 1326   01/31/13 1500  cefTRIAXone (ROCEPHIN) 2 g in dextrose 5 % 50 mL IVPB  Status:  Discontinued     2 g 100 mL/hr over 30 Minutes Intravenous Every 24 hours 01/31/13 1434 02/04/13 1326   01/31/13 1400  piperacillin-tazobactam (ZOSYN) IVPB 3.375 g  Status:  Discontinued     3.375 g 12.5 mL/hr over 240 Minutes Intravenous 3 times per day 01/31/13 1330 01/31/13 1434   01/30/13 1800  piperacillin-tazobactam (ZOSYN) IVPB 2.25 g  Status:  Discontinued     2.25 g 100 mL/hr over 30 Minutes Intravenous 3 times per day 01/30/13 1636 01/31/13 1330   01/30/13 1800  vancomycin (VANCOCIN) IVPB 750 mg/150 ml premix  Status:  Discontinued     750 mg 150 mL/hr over 60 Minutes Intravenous Every 48 hours 01/30/13 1636 01/31/13 1330          Subjective:   [name] today has, No headache, No chest pain, No abdominal pain - No Nausea, No new weakness tingling or numbness, No Cough - SOB.    Objective:   Filed Vitals:   02/06/13 1030 02/06/13 1327 02/06/13 1936 02/07/13 0450  BP:  99/59 100/54 95/51  Pulse:  105 85 78  Temp:  98.6 F (37 C) 98.3 F (36.8 C) 97.9 F (36.6 C)  TempSrc:  Oral Oral Oral  Resp:  18 18 18   Height:      Weight:    59.058 kg (130 lb 3.2 oz)  SpO2: 95% 100% 99% 93%    Wt Readings from Last 3 Encounters:  02/07/13 59.058 kg (130 lb 3.2 oz)  02/07/13 59.058 kg (130 lb 3.2 oz)  06/22/12 61.689 kg (136 lb)     Intake/Output Summary (Last 24 hours) at 02/07/13 1213 Last data filed at 02/07/13 0944  Gross per 24 hour  Intake   1860 ml  Output   1081 ml  Net    779 ml    Exam Awake Alert, Oriented X 3, No new F.N deficits, Normal affect [place].AT,PERRAL Supple Neck,No JVD, No cervical lymphadenopathy appriciated.  Symmetrical Chest wall movement, reduced breath sounds at the left base L chest Tube RRR,No Gallops,Rubs or new Murmurs, No  Parasternal Heave +ve B.Sounds, Abd Soft, Non tender, No organomegaly appriciated, No rebound - guarding or rigidity. No Cyanosis, Clubbing or edema, No new Rash or bruise  Data Review   Micro Results Recent Results (from the past 240 hour(s))  BODY FLUID CULTURE     Status: None   Collection Time    01/30/13  3:37 PM      Result Value Range Status   Specimen Description PLEURAL FLUID LEFT   Final   Special Requests 6.OML FLUID   Final   Gram Stain     Final   Value: ABUNDANT WBC PRESENT,BOTH PMN AND MONONUCLEAR     ABUNDANT GRAM POSITIVE COCCI IN CLUSTERS     Gram Stain Report Called to,Read Back By and Verified With: Gram Stain Report Called to,Read Back By and Verified With:  JAMES A @813  ON 01/31/13 BY SMIAS     Performed at [facility]   Culture     Final   Value: ABUNDANT STREPTOCOCCUS SPECIES     Note: IDENTIFIED AS STREPTOCOCCUS INTERMEDIUS     Performed at [facility]   Report Status 02/03/2013 FINAL   Final   Organism ID, Bacteria STREPTOCOCCUS SPECIES   Final  SURGICAL PCR SCREEN     Status: None   Collection Time    01/31/13  8:16 PM      Result Value Range Status   MRSA, PCR NEGATIVE  NEGATIVE Final   Staphylococcus aureus NEGATIVE  NEGATIVE Final   Comment:            The Xpert SA Assay (FDA     approved for NASAL specimens     in patients over 58 years of age),     is one component of     a comprehensive surveillance     program.  Test performance has     been validated by [employer] for patients greater     than or equal to 53 year old.     It is not intended     to diagnose infection nor to     guide or monitor treatment.    Radiology Reports Dg Chest 2 View  01/29/2013   CLINICAL DATA:  Shortness of breath, productive cough, weakness, and weight loss. There is history of coronary artery disease, CHF, and chronic renal failure.  EXAM: CHEST  2 VIEW  COMPARISON:  April 15, 2012.  FINDINGS: There is volume loss on the left  likely secondary to a pleural effusion. On the right the lung is well-expanded and clear. The cardiac silhouette is top-normal in size. The pulmonary vascularity is not engorged. There is tortuosity of the descending thoracic aorta. There is curvature of the thoracic spine with the convexity towards the right.  IMPRESSION: There has been interval increase in the size of the left pleural effusion. No significant effusion on the right is demonstrated today.   Electronically Signed   By: David  [place]   On: 01/29/2013 11:21   Ct Chest Wo Contrast  01/29/2013   CLINICAL DATA:  Increasing dyspnea.  Productive cough.  EXAM: CT CHEST WITHOUT CONTRAST  TECHNIQUE: Multidetector CT imaging of the chest was performed following the standard protocol without IV contrast.  COMPARISON:  Chest radiographs obtained earlier today and chest CT dated 03/27/2006.  FINDINGS: Interval moderate to large-sized loculated pleural effusion in the left mid and lower lung zones. There is also a suggestion of a faintly visible pleural based mass inferiorly and medially, measuring 2.5 x 1.9 cm on image number 59. There is also a probable 2nd pleural based mass medially and inferiorly, measuring 2.8 x 1.5  cm on image number 64. Visualization of these masses is somewhat limited due to the lack of intravenous contrast. There is compressive atelectasis and some parenchymal calcification medial to the pleural fluid as well as deviation of the heart to the right. There is also patchy airspace opacity in the adjacent superior segment of the left lower lobe.  Dense proximal coronary artery calcifications are noted. Also noted is mild right lower lobe atelectasis. No enlarged lymph nodes are seen. Again demonstrated are bilateral renal cysts. These include extensive bilateral parapelvic cysts and possible chronic hydronephrosis. There are also small gallstones in the gallbladder, the largest measuring 3 mm in maximum diameter. Degenerative changes in  the thoracic and cervical spine with changes of DISH. Mild to moderate dextro convex scoliosis.  IMPRESSION: 1. Moderate to large-sized loculated left pleural fluid with possible pleural-based masses. These findings are concerning for a loculated malignant pleural effusion. Further evaluation with thoracentesis is recommended. The loculated pleural fluid is causing deviation of the heart to the right. 2. Probable pneumonia in the superior segment of the left lower lobe. 3. Bilateral lower lobe atelectasis, greater on the left. 4. Dense proximal coronary artery calcifications. 5. Cholelithiasis. 6. Chronic bilateral renal parapelvic cysts and possible UPJ obstructions.   Electronically Signed   By: Gordan Payment M.D.   On: 01/29/2013 23:08   Dg Chest Left Decubitus  01/29/2013   CLINICAL DATA:  Evaluate for layering effusion  EXAM: CHEST - LEFT DECUBITUS  COMPARISON:  Prior chest x-ray 01/29/2013  FINDINGS: No evidence of layering pleural of fluid. Otherwise, no significant interval change in the appearance of the chest compared to earlier today.  IMPRESSION: No layering fluid. The left basilar opacity may reflect a loculated pleural effusion, dense consolidation, or potentially an underlying mass. Recommend further evaluation with contrasted CT scan of the chest.   Electronically Signed   By: Malachy Moan M.D.   On: 01/29/2013 15:47   US Renal  01/29/2013   CLINICAL DATA:  Followup hydronephrosis  EXAM: RENAL/URINARY TRACT ULTRASOUND COMPLETE  COMPARISON:  04/16/2012  FINDINGS: Right Kidney  Length: 10.4 cm. Echogenicity is within normal limits. There is been significant improvement in right hydronephrosis with residual wall pelvocaliectasis. Cyst within the inferior pole measures 10 x 7 x 8 mm.  Left Kidney  Length: Measures 10.7 cm. Echogenicity is within normal limits. There is moderate left hydronephrosis. This is improved when compared with the previous exam. Cyst within the upper pole measures 1.4 x  1.7 x 1.5 cm.  Bladder  Appears normal for degree of bladder distention.  Other:   The prostate gland is enlarged measuring 5 x 4.8 x 5.8 cm  IMPRESSION: 1. Interval improvement in severe bilateral hydronephrosis. There is residual right pelvocaliectasis and moderate left hydronephrosis. 2. Bilateral renal cysts. 3. Prostate gland enlargement.   Electronically Signed   By: Signa Kell M.D.   On: 01/29/2013 17:06   Dg Chest Port 1 View  02/06/2013   CLINICAL DATA:  Assess left lower lobe airspace disease  EXAM: PORTABLE CHEST - 1 VIEW  COMPARISON:  02/05/2013  FINDINGS: Left thoracostomy tube in stable position. Left base opacification, both consolidation and partly loculated pleural fluid are stable. Loculated left base pneumothorax unchanged as well. The right lung remains well aerated. No appreciable change in cardiac enlargement. The ascending aorta is tortuous.  IMPRESSION: 1. Unchanged loculated left base hydropneumothorax. 2. Left lower lobe pneumonia or atelectasis is unchanged. 3. Unchanged positioning of left chest tube.  Electronically Signed   By: Tiburcio Pea M.D.   On: 02/06/2013 07:03   Dg Chest Port 1 View  02/05/2013   CLINICAL DATA:  78 year old male with left diffusion, pleural nodularity. Initial encounter.  EXAM: PORTABLE CHEST - 1 VIEW  COMPARISON:  02/04/2013 and earlier.  FINDINGS: Portable AP semi upright view at 0657 hrs. Stable left chest tube, projecting in the mid left lung. Suspect a residual component of more caudal loculated fluid. Overall stable ventilation. No pneumothorax identified. Stable cardiac size and mediastinal contours. Of the right lung remains grossly clear.  IMPRESSION: Stable ventilation. Left chest tube in place just below the level of the left hilum. Residual lung base effusion.   Electronically Signed   By: Augusto Gamble M.D.   On: 02/05/2013 08:02   Dg Chest Port 1 View  02/04/2013   CLINICAL DATA:  Followup effusion and chest tube  EXAM: PORTABLE  CHEST - 1 VIEW  COMPARISON:  02/02/2013  FINDINGS: Loculated pleural fluid on the left with focal loculated left lower lateral pleural air is stable. The position of the left chest tube is also stable. No apical pneumothorax.  Right lung is clear.  IMPRESSION: Stable appearance from the prior exam. Persistent loculated left inferolateral hydro pneumothorax. Left chest tube is stable.   Electronically Signed   By: Amie Portland M.D.   On: 02/04/2013 07:33   Dg Chest Port 1 View  02/02/2013   CLINICAL DATA:  Left-sided hydro pneumothorax.  EXAM: PORTABLE CHEST - 1 VIEW  COMPARISON:  One-view chest 02/01/2013.  CT chest 01/29/2013.  FINDINGS: The left-sided chest tube is in place. The loculated pleural fluid collection continues to decrease in size. The amount of air in the collection is decreased as well. Left lower lobe airspace versus mass persists. The heart remains enlarged. The right lung is clear.  IMPRESSION: 1. Continued decrease and left-sided hydro pneumothorax. 2. Continued soft tissue density in the left lower lobe compatible with airspace disease or mass lesion. 3. Persistent cardiomegaly without failure.   Electronically Signed   By: Gennette Pac M.D.   On: 02/02/2013 07:47   Dg Chest Port 1 View  02/01/2013   CLINICAL DATA:  Respiratory failure. Left pleural fluid chest tube drainage.  EXAM: PORTABLE CHEST - 1 VIEW  COMPARISON:  01/30/2013.  CT chest 01/29/2013.  FINDINGS: Large bore chest tube is noted in stable position of the left lower chest. Previously identified left base pleural fluid and air collection remains. The air collection is diminished in size from prior exam. Mild left perihilar atelectasis and infiltrate noted on today's exam. Right lung is clear. Cardiomegaly. No pulmonary venous congestion.  IMPRESSION: 1. Large bore chest tube is noted over left lower pleural space in stable position. The left base pleural fluid and air collection remain. Air in the left pleural cavity has  diminished on today's exam. 2. Mild left perihilar infiltrate.   Electronically Signed   By: Maisie Fus  Register   On: 02/01/2013 07:48   Dg Chest Port 1 View  01/30/2013   CLINICAL DATA:  Status post left-sided chest tube insertion.  EXAM: PORTABLE CHEST - 1 VIEW  COMPARISON:  CT scan of chest dated January 29, 2013.  FINDINGS: A large caliber chest tube is been placed on the left with the tip of the tube to lie between the posterior aspects of the left 8 and 9th ribs medially. There has been interval improvement in the opacity of the left mid and lower hemithorax  consistent with drainage of pleural fluid. There is pleural space air now associated with residual fluid and thickened pleura. There is no mediastinal shift.  The cardiac silhouette is top-normal in size. The pulmonary vascularity is not engorged. There is tortuosity of the descending thoracic aorta.  IMPRESSION: The patient has undergone interval drainage of a large amount of of fluid from the left pleural space by placement of a large caliber chest tube. There is residual soft tissue density present and there is pleural space air visible in the lower hemithorax. No apical pneumothorax is demonstrated.   Electronically Signed   By: David  [place]   On: 01/30/2013 16:47    CBC  Recent Labs Lab 01/31/13 1955 02/01/13 0545 02/05/13 0527  WBC 6.5 5.4 5.8  HGB 9.4* 9.1* 8.8*  HCT 29.3* 28.5* 28.2*  PLT 267 281 233  MCV 85.4 84.6 87.3  MCH 27.4 27.0 27.2  MCHC 32.1 31.9 31.2  RDW 16.0* 15.6* 16.3*    Chemistries   Recent Labs Lab 01/31/13 1955 02/01/13 0545 02/05/13 0527 02/05/13 1720 02/06/13 1308 02/07/13 0510  NA 135 139 137 134* 137 140  K 5.2* 5.1 6.2* 5.8* 5.6* 5.1  CL 105 109 106 103 104 107  CO2 21 21 26 23 26 27   GLUCOSE 127* 86 109* 130* 149* 80  BUN 54* 51* 48* 46* 45* 42*  CREATININE 2.14* 2.03* 1.98* 1.93* 2.14* 2.14*  CALCIUM 8.3* 8.3* 8.8 8.8 8.4 8.1*  AST 30 18  --   --   --   --   ALT 17 15  --   --   --    --   ALKPHOS 70 68  --   --   --   --   BILITOT 0.2* 0.3  --   --   --   --    ------------------------------------------------------------------------------------------------------------------ estimated creatinine clearance is 24.5 ml/min (by C-G formula based on Cr of 2.14). ------------------------------------------------------------------------------------------------------------------ No results found for this basename: HGBA1C,  in the last 72 hours ------------------------------------------------------------------------------------------------------------------ No results found for this basename: CHOL, HDL, LDLCALC, TRIG, CHOLHDL, LDLDIRECT,  in the last 72 hours ------------------------------------------------------------------------------------------------------------------ No results found for this basename: TSH, T4TOTAL, FREET3, T3FREE, THYROIDAB,  in the last 72 hours ------------------------------------------------------------------------------------------------------------------ No results found for this basename: VITAMINB12, FOLATE, FERRITIN, TIBC, IRON, RETICCTPCT,  in the last 72 hours  Coagulation profile  Recent Labs Lab 01/31/13 1955 02/01/13 0545  INR 1.41 1.41    No results found for this basename: DDIMER,  in the last 72 hours  Cardiac Enzymes No results found for this basename: CK, CKMB, TROPONINI, MYOGLOBIN,  in the last 168 hours ------------------------------------------------------------------------------------------------------------------ No components found with this basename: POCBNP,      Time Spent in minutes  35   Brinlyn Cena K M.D on 02/07/2013 at 12:13 PM  Between 7am to 7pm - Pager - [phone]  After 7pm go to [email or web address] - password [email or web address]  And look for the night coverage person covering for me after hours  [facility] Office  [phone]

## 2013-02-07 NOTE — Progress Notes (Signed)
Patient ZO:XWRUE Sames      DOB: 1934/12/14      AVW:[id number]  Chart reviewed. Patient continues to refuse offered treatment.  If patient discharges to SNF please consider Palliative Care Services consult to work with him their.  Will attempt one last time to work with him in the am.  Doreene Forrey L. Ladona Ridgel, MD MBA The Palliative Medicine Team at [facility] Phone: [phone] Pager: [phone]

## 2013-02-07 NOTE — Progress Notes (Signed)
PULMONARY  / CRITICAL CARE MEDICINE  Name: [name] MRN: [id number] DOB: 02/17/1935    ADMISSION DATE:  01/29/2013 CONSULTATION DATE:  01/30/13   REFERRING MD :  [facility] PRIMARY SERVICE:  [facility] Primary MD:  Zenaida Deed  CHIEF COMPLAINT:  SOB  BRIEF PATIENT DESCRIPTION: 78 y/o with PMH of CHF, CKD, ischemic cardiomyopathy, and h/o Non hodgkins lymphoma admitted by [facility] 11/11 for 1 week history of worsening SOB and productive cough.  CT scan of chest w/o contrast showed large left pleural effusion w/ possible loculation and two poorly visualized masses.  L CT placed with strep in fluid.    SIGNIFICANT EVENTS / STUDIES:  11/11 - Admitted shortness of breath 11/11 - CT chest w/o contrast >> Large left pleural effusion, possible loculations and right sided deviation of the heart, difficult to visualize masses likely pleural based 11/12 - Left Thoracentesis >>> 1L pus, cytol=neg 11/12 - CT placed  11/13 - TCTS consult for surgical drainage and mgmt of trapped lung. Pt refused VATS 11/14 - Some improvement/ re-expansion of LLL 11/15 - CXR stable resid L effus, atx, tube ok 11/18 - minimal CT drainage 11/20 - CT pulled @ 1010 am, minimal drainage  TUBES/LINES: 11/12 L Chest tube>>11/20  CULTURES: 11/12 pleural fluid >> abundant strep>>PCN sens  ANTIBIOTICS: Zosyn 11/12>> 11/13 Vancomycin 11/12 >> 11/17 Ceftriaxone 11/13 >> 11/17 11/17 cefazolin >>>  SUBJECTIVE:  Patient denies acute complaints.  Hopeful to go to rehab   VITAL SIGNS: Temp:  [97.9 F (36.6 C)-98.6 F (37 C)] 97.9 F (36.6 C) (11/20 0450) Pulse Rate:  [78-105] 78 (11/20 0450) Resp:  [18] 18 (11/20 0450) BP: (95-100)/(51-59) 95/51 mmHg (11/20 0450) SpO2:  [93 %-100 %] 93 % (11/20 0450) Weight:  [130 lb 3.2 oz (59.058 kg)] 130 lb 3.2 oz (59.058 kg) (11/20 0450)  PHYSICAL EXAMINATION: General:  Thin cachectic, weak Neuro: No focal deficits HEENT: WNL Cardiovascular:  RRR gr 1/6 m at LSB  Lungs: clear  anteriorly, decr BS on left, L chest tube in position with minimal drainage Abdomen:  +BS, soft, non-tender Ext: No edema  LABS   Recent Labs Lab 01/31/13 1955 02/01/13 0545 02/05/13 0527  HGB 9.4* 9.1* 8.8*  HCT 29.3* 28.5* 28.2*  WBC 6.5 5.4 5.8  PLT 267 281 233    Recent Labs Lab 02/01/13 0545 02/05/13 0527 02/05/13 1720 02/06/13 1308 02/07/13 0510  NA 139 137 134* 137 140  K 5.1 6.2* 5.8* 5.6* 5.1  CL 109 106 103 104 107  CO2 21 26 23 26 27   GLUCOSE 86 109* 130* 149* 80  BUN 51* 48* 46* 45* 42*  CREATININE 2.03* 1.98* 1.93* 2.14* 2.14*  CALCIUM 8.3* 8.8 8.8 8.4 8.1*   CXR - 11/19 stable LLL loculated effusion, CT in good position - unchanged  ASSESSMENT:  Left Empyema - Cytology- neg (no malig cells, + acute inflamm).  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  See comments below.  -Has refused other Rx as well (cath etc).  Recommend Palliative Care involvement -CT removed 11/20 am, repeat CXR pending.  Site covered with Vaseline gauze & dry dressing with tape.  Small amt air leak heard through dressing. -repeat CXR in one hour -discussed with pt & staff if any clinical changes to include SOB, chest pain, hemodynamic change to get CXR sooner and call -D9/x abx   Would hold d/c until am to ensure no changes post CT removal.  Canary Brim, NP-C [facility] Pgr: [phone] or [phone]  Attending:  I have seen and examined the patient with nurse practitioner/resident and agree with the note above.   Repeat CXR in AM Rec palliative care, goals of care OK to d/c in AM if CXR stable  Yolonda Kida PCCM Pager: [phone] Cell: [phone] If no response, call [phone]

## 2013-02-08 ENCOUNTER — Other Ambulatory Visit: Payer: Self-pay | Admitting: *Deleted

## 2013-02-08 ENCOUNTER — Inpatient Hospital Stay (HOSPITAL_COMMUNITY): Payer: Medicare Other

## 2013-02-08 LAB — BASIC METABOLIC PANEL
BUN: 38 mg/dL — ABNORMAL HIGH (ref 6–23)
Calcium: 8.2 mg/dL — ABNORMAL LOW (ref 8.4–10.5)
Creatinine, Ser: 2.09 mg/dL — ABNORMAL HIGH (ref 0.50–1.35)
GFR calc Af Amer: 34 mL/min — ABNORMAL LOW (ref >90)
GFR calc non Af Amer: 29 mL/min — ABNORMAL LOW (ref >90)
Potassium: 4.9 mEq/L (ref 3.5–5.1)
Sodium: 137 mEq/L (ref 135–145)

## 2013-02-08 MED ORDER — TAMSULOSIN HCL 0.4 MG PO CAPS: 0.4000 mg | ORAL_CAPSULE | Freq: Every day | ORAL | Status: AC

## 2013-02-08 MED ORDER — AMOXICILLIN-POT CLAVULANATE 875-125 MG PO TABS: 1.0000 | ORAL_TABLET | Freq: Two times a day (BID) | ORAL | Status: DC

## 2013-02-08 MED ORDER — HYDROCODONE-ACETAMINOPHEN 5-325 MG PO TABS: 1.0000 | ORAL_TABLET | ORAL | Status: DC | PRN

## 2013-02-08 MED ORDER — HYDROCODONE-ACETAMINOPHEN 5-325 MG PO TABS: ORAL_TABLET | ORAL | Status: DC

## 2013-02-08 MED ORDER — ENSURE COMPLETE PO LIQD: 237.0000 mL | Freq: Every day | ORAL | Status: AC

## 2013-02-08 NOTE — Telephone Encounter (Signed)
rx filled per protocol  

## 2013-02-08 NOTE — Discharge Summary (Signed)
[facility]                                                                                   [name], is a 78 y.o. male  DOB 10/26/1934  MRN [id number].  Admission date:  01/29/2013  Admitting Physician  Alba Cory, MD  Discharge Date:  02/08/2013   Primary MD  [name], MD  Recommendations for primary care physician for things to follow:   Follow chest x-ray, make sure patient follows with pulmonary on a close basis   Admission Diagnosis  Bilateral hydronephrosis [591] Acute systolic CHF (congestive heart failure) [428.21, 428.0] Acute dyspnea [786.09]  Discharge Diagnosis   left-sided empyema  Principal Problem:   Pleural effusion, left Active Problems:   Aortic insufficiency   CKD (chronic kidney disease), stage III   Acute systolic CHF (congestive heart failure)   Bilateral hydronephrosis   Cardiomyopathy, ischemic   CAD (coronary artery disease)   CAP (community acquired pneumonia)   Empyema   Paroxysmal a-fib   Acute respiratory failure   Trapped lung   Protein-calorie malnutrition, severe   Atrial fibrillation      Past Medical History  Diagnosis Date  . Aortic insufficiency     a. Previously mod-severe;  b. 03/2012 Echo: Mild to Mod AI  . Atrial flutter     resolved  . Anemia   . PVCs (premature ventricular contractions)   . Chronic systolic CHF (congestive heart failure)     a.  03/2012 Echo: EF 15%, Mild to Mod AI, Mod MR  . Bilateral hydronephrosis     a. 03/2012 Renal u/s: Bilat Severe hydronephrosis, medical renal dzs.  . CKD (chronic kidney disease), stage III   . Non Hodgkin's lymphoma     resolved >10 yr ago  . Moderate mitral regurgitation     a.  03/2012 Echo: Mod MR  . BPH (benign prostatic hyperplasia)     a. noted on renal u/s 03/2012.  . Ischemic cardiomyopathy     a.  Lexiscan Myoview 04/24/12: Inferior apical MI, study not gated due to frequent PVCs, triplets => patient refuses cath  . Gout     Past  Surgical History  Procedure Laterality Date  . Lymph node dissection      Groin  . Inguinal hernia repair       Discharge Condition: Stable   Follow-up Information   Follow up with ROBERTS, Vernie Ammons, MD. Schedule an appointment as soon as possible for a visit in 4 days.   Specialty:  Internal Medicine   Contact information:   [address] [place] [address] [phone]       Follow up with SOOD,VINEET, MD. Schedule an appointment as soon as possible for a visit in 1 week.   Specialty:  Pulmonary Disease   Contact information:   [address] [place] [address] [phone]         Consults obtained - PCCM and cardiothoracic surgery   Discharge Medications      Medication List         amoxicillin-clavulanate 875-125 MG per tablet  Commonly known as:  AUGMENTIN  Take 1 tablet by mouth 2 (two) times daily.     feeding supplement (ENSURE COMPLETE) Liqd  Take 237 mLs by mouth daily after supper.     furosemide 40 MG tablet  Commonly known as:  LASIX  Take 40 mg by mouth daily as needed for fluid.     HYDROcodone-acetaminophen 5-325 MG per tablet  Commonly known as:  NORCO/VICODIN  Take 1-2 tablets by mouth every 4 (four) hours as needed for moderate pain.     nitroGLYCERIN 0.4 MG SL tablet  Commonly known as:  NITROSTAT  Place 1 tablet (0.4 mg total) under the tongue every 5 (five) minutes x 3 doses as needed for chest pain.     tamsulosin 0.4 MG Caps capsule  Commonly known as:  FLOMAX  Take 1 capsule (0.4 mg total) by mouth daily after supper.     triamcinolone cream 0.1 %  Commonly known as:  KENALOG  Apply 1 application topically daily as needed. For rash         Diet and Activity recommendation: See Discharge Instructions below   Discharge Instructions     Follow with Primary MD ROBERTS, Vernie Ammons, MD or SNF MD in 4 days   Get CBC, CMP, checked 4 days by Primary MD and again as instructed by your Primary MD.  Get a 2 view Chest X ray done next visit .  Get Medicines reviewed and adjusted.  Please request your Prim.MD to go over all Hospital Tests and Procedure/Radiological results at the follow up, please get all Hospital records sent to your Prim MD by signing hospital release before you go home.  Activity: As tolerated with Full fall precautions use walker/cane & assistance as needed   Diet:  Heart Healthy  Check your Weight same time everyday, if you gain over 2 pounds, or you develop in leg swelling, experience more shortness of breath or chest pain, call your Primary MD immediately. Follow Cardiac Low Salt Diet and 1.8 lit/day fluid restriction.  Disposition SNF  If you experience worsening of your admission symptoms, develop shortness of breath, life threatening emergency, suicidal or homicidal thoughts you must seek medical attention immediately by calling 911 or calling your MD immediately  if symptoms less severe.  You Must read complete instructions/literature along with all the possible adverse reactions/side effects for all the Medicines you take and that have been prescribed to you. Take any new Medicines after you have completely understood and accpet all the possible adverse reactions/side effects.   Do not drive and provide baby sitting services if your were admitted for syncope or siezures until you have seen by Primary MD or a Neurologist and advised to do so again.  Do not drive when taking Pain medications.    Do not take more than prescribed Pain, Sleep and Anxiety Medications  Special Instructions: If you have smoked or chewed Tobacco  in the last 2 yrs please stop smoking, stop any regular Alcohol  and or any Recreational drug use.  Wear Seat belts while driving.   Please note  You were cared for by a hospitalist during your hospital stay. If you have any questions about your discharge medications or the care you received while you were in the hospital after you are  discharged, you can call the unit and asked to speak with the hospitalist on call if the hospitalist that took care of you is not available. Once you are discharged, your primary care physician will handle any further medical  issues. Please note that NO REFILLS for any discharge medications will be authorized once you are discharged, as it is imperative that you return to your primary care physician (or establish a relationship with a primary care physician if you do not have one) for your aftercare needs so that they can reassess your need for medications and monitor your lab values.    Major procedures and Radiology Reports - PLEASE review detailed and final reports for all details, in brief -       Dg Chest 2 View  02/08/2013   CLINICAL DATA:  Short of breath, empyema  EXAM: CHEST  2 VIEW  COMPARISON:  Prior chest x-ray 02/07/2013; prior CT chest 01/29/2013  FINDINGS: Stable loculated left basilar pneumothorax ex vacuo. Persistent pleural thickening and chronic atelectatic changes in the left lower lobe and inferior lingula. Stable cardiomegaly and tortuous and atherosclerotic thoracic aorta. Inspiratory volumes are lower and there is increased streaky opacity in the right perihilar and right basilar regions most likely representing atelectasis. No acute osseous abnormality.  IMPRESSION: 1. Stable appearance of the loculated left basilar pneumothorax ex vacuo with associated chronic pleural thickening and persistent left lower lobe and inferior lingular atelectasis. 2. Lower inspiratory volumes with increased right perihilar and basilar subsegmental atelectasis.   Electronically Signed   By: Malachy Moan M.D.   On: 02/08/2013 08:13   Dg Chest 2 View  01/29/2013   CLINICAL DATA:  Shortness of breath, productive cough, weakness, and weight loss. There is history of coronary artery disease, CHF, and chronic renal failure.  EXAM: CHEST  2 VIEW  COMPARISON:  April 15, 2012.  FINDINGS: There is  volume loss on the left likely secondary to a pleural effusion. On the right the lung is well-expanded and clear. The cardiac silhouette is top-normal in size. The pulmonary vascularity is not engorged. There is tortuosity of the descending thoracic aorta. There is curvature of the thoracic spine with the convexity towards the right.  IMPRESSION: There has been interval increase in the size of the left pleural effusion. No significant effusion on the right is demonstrated today.   Electronically Signed   By: David  [place]   On: 01/29/2013 11:21   Ct Chest Wo Contrast  01/29/2013   CLINICAL DATA:  Increasing dyspnea.  Productive cough.  EXAM: CT CHEST WITHOUT CONTRAST  TECHNIQUE: Multidetector CT imaging of the chest was performed following the standard protocol without IV contrast.  COMPARISON:  Chest radiographs obtained earlier today and chest CT dated 03/27/2006.  FINDINGS: Interval moderate to large-sized loculated pleural effusion in the left mid and lower lung zones. There is also a suggestion of a faintly visible pleural based mass inferiorly and medially, measuring 2.5 x 1.9 cm on image number 59. There is also a probable 2nd pleural based mass medially and inferiorly, measuring 2.8 x 1.5 cm on image number 64. Visualization of these masses is somewhat limited due to the lack of intravenous contrast. There is compressive atelectasis and some parenchymal calcification medial to the pleural fluid as well as deviation of the heart to the right. There is also patchy airspace opacity in the adjacent superior segment of the left lower lobe.  Dense proximal coronary artery calcifications are noted. Also noted is mild right lower lobe atelectasis. No enlarged lymph nodes are seen. Again demonstrated are bilateral renal cysts. These include extensive bilateral parapelvic cysts and possible chronic hydronephrosis. There are also small gallstones in the gallbladder, the largest measuring 3 mm in maximum  diameter.  Degenerative changes in the thoracic and cervical spine with changes of DISH. Mild to moderate dextro convex scoliosis.  IMPRESSION: 1. Moderate to large-sized loculated left pleural fluid with possible pleural-based masses. These findings are concerning for a loculated malignant pleural effusion. Further evaluation with thoracentesis is recommended. The loculated pleural fluid is causing deviation of the heart to the right. 2. Probable pneumonia in the superior segment of the left lower lobe. 3. Bilateral lower lobe atelectasis, greater on the left. 4. Dense proximal coronary artery calcifications. 5. Cholelithiasis. 6. Chronic bilateral renal parapelvic cysts and possible UPJ obstructions.   Electronically Signed   By: Gordan Payment M.D.   On: 01/29/2013 23:08   Dg Chest Left Decubitus  01/29/2013   CLINICAL DATA:  Evaluate for layering effusion  EXAM: CHEST - LEFT DECUBITUS  COMPARISON:  Prior chest x-ray 01/29/2013  FINDINGS: No evidence of layering pleural of fluid. Otherwise, no significant interval change in the appearance of the chest compared to earlier today.  IMPRESSION: No layering fluid. The left basilar opacity may reflect a loculated pleural effusion, dense consolidation, or potentially an underlying mass. Recommend further evaluation with contrasted CT scan of the chest.   Electronically Signed   By: Malachy Moan M.D.   On: 01/29/2013 15:47   US Renal  01/29/2013   CLINICAL DATA:  Followup hydronephrosis  EXAM: RENAL/URINARY TRACT ULTRASOUND COMPLETE  COMPARISON:  04/16/2012  FINDINGS: Right Kidney  Length: 10.4 cm. Echogenicity is within normal limits. There is been significant improvement in right hydronephrosis with residual wall pelvocaliectasis. Cyst within the inferior pole measures 10 x 7 x 8 mm.  Left Kidney  Length: Measures 10.7 cm. Echogenicity is within normal limits. There is moderate left hydronephrosis. This is improved when compared with the previous exam. Cyst within the  upper pole measures 1.4 x 1.7 x 1.5 cm.  Bladder  Appears normal for degree of bladder distention.  Other:   The prostate gland is enlarged measuring 5 x 4.8 x 5.8 cm  IMPRESSION: 1. Interval improvement in severe bilateral hydronephrosis. There is residual right pelvocaliectasis and moderate left hydronephrosis. 2. Bilateral renal cysts. 3. Prostate gland enlargement.   Electronically Signed   By: Signa Kell M.D.   On: 01/29/2013 17:06   Dg Chest Port 1 View  02/07/2013   CLINICAL DATA:  Chest tube removal  EXAM: PORTABLE CHEST - 1 VIEW  COMPARISON:  02/07/2013  FINDINGS: Left chest tube has been removed. Left basilar pneumothorax is unchanged. There is pleural thickening/ pleural fluid also unchanged. Left lower lobe consolidation is unchanged.  Right lung is clear.  IMPRESSION: Left basilar pneumothorax unchanged following left chest tube removal.   Electronically Signed   By: Marlan Palau M.D.   On: 02/07/2013 12:53   Dg Chest Port 1 View  02/07/2013   CLINICAL DATA:  Pneumothorax  EXAM: PORTABLE CHEST - 1 VIEW  COMPARISON:  Yesterday  FINDINGS: Stable left chest tube. Stable left basilar loculated pneumothorax. Stable pleural thickening along the left mid lateral chest wall. Cardiomegaly. Tortuous aorta. Hyperaeration. Linear interface at the lateral right apex represents skin fold.  IMPRESSION: Stable left chest tube and loculated left pneumothorax.   Electronically Signed   By: Maryclare Bean M.D.   On: 02/07/2013 07:59   Dg Chest Port 1 View  02/06/2013   CLINICAL DATA:  The pneumothorax  EXAM: PORTABLE CHEST - 1 VIEW  COMPARISON:  02/06/2013 at 4:52 a.m.  FINDINGS: Left chest tube noted with adjacent  pleural thickening. Lucency at the left lung base has been previously described is pneumothorax but could represent aerated lung or pneumothorax loculated along the left lung base. No apical pneumothorax is appreciated.  Stable indistinct airspace opacity in the left mid lung and particularly in  the left lower lobe, obscuring the left hemidiaphragm.  Right lung clear. Stable cardiomegaly. Atherosclerotic aortic arch. Calcifications in the left hilum likely from old granulomatous disease.  Stable dextroconvex thoracic scoliosis.  IMPRESSION: 1. Stable lucency at the left lung base. This does likely represent a small loculated pneumothorax, with a small amount of aerated lung is a differential diagnostic consideration. 2. Stable pleural thickening at the level of the left chest tube and along the left lung base, with stable airspace opacity in the left mid lung and left lower lobe. 3. Stable cardiomegaly.   Electronically Signed   By: Herbie Baltimore M.D.   On: 02/06/2013 17:15   Dg Chest Port 1 View  02/06/2013   CLINICAL DATA:  Assess left lower lobe airspace disease  EXAM: PORTABLE CHEST - 1 VIEW  COMPARISON:  02/05/2013  FINDINGS: Left thoracostomy tube in stable position. Left base opacification, both consolidation and partly loculated pleural fluid are stable. Loculated left base pneumothorax unchanged as well. The right lung remains well aerated. No appreciable change in cardiac enlargement. The ascending aorta is tortuous.  IMPRESSION: 1. Unchanged loculated left base hydropneumothorax. 2. Left lower lobe pneumonia or atelectasis is unchanged. 3. Unchanged positioning of left chest tube.   Electronically Signed   By: Tiburcio Pea M.D.   On: 02/06/2013 07:03   Dg Chest Port 1 View  02/05/2013   CLINICAL DATA:  78 year old male with left diffusion, pleural nodularity. Initial encounter.  EXAM: PORTABLE CHEST - 1 VIEW  COMPARISON:  02/04/2013 and earlier.  FINDINGS: Portable AP semi upright view at 0657 hrs. Stable left chest tube, projecting in the mid left lung. Suspect a residual component of more caudal loculated fluid. Overall stable ventilation. No pneumothorax identified. Stable cardiac size and mediastinal contours. Of the right lung remains grossly clear.  IMPRESSION: Stable  ventilation. Left chest tube in place just below the level of the left hilum. Residual lung base effusion.   Electronically Signed   By: Augusto Gamble M.D.   On: 02/05/2013 08:02   Dg Chest Port 1 View  02/04/2013   CLINICAL DATA:  Followup effusion and chest tube  EXAM: PORTABLE CHEST - 1 VIEW  COMPARISON:  02/02/2013  FINDINGS: Loculated pleural fluid on the left with focal loculated left lower lateral pleural air is stable. The position of the left chest tube is also stable. No apical pneumothorax.  Right lung is clear.  IMPRESSION: Stable appearance from the prior exam. Persistent loculated left inferolateral hydro pneumothorax. Left chest tube is stable.   Electronically Signed   By: Amie Portland M.D.   On: 02/04/2013 07:33   Dg Chest Port 1 View  02/02/2013   CLINICAL DATA:  Left-sided hydro pneumothorax.  EXAM: PORTABLE CHEST - 1 VIEW  COMPARISON:  One-view chest 02/01/2013.  CT chest 01/29/2013.  FINDINGS: The left-sided chest tube is in place. The loculated pleural fluid collection continues to decrease in size. The amount of air in the collection is decreased as well. Left lower lobe airspace versus mass persists. The heart remains enlarged. The right lung is clear.  IMPRESSION: 1. Continued decrease and left-sided hydro pneumothorax. 2. Continued soft tissue density in the left lower lobe compatible with airspace disease or  mass lesion. 3. Persistent cardiomegaly without failure.   Electronically Signed   By: Gennette Pac M.D.   On: 02/02/2013 07:47   Dg Chest Port 1 View  02/01/2013   CLINICAL DATA:  Respiratory failure. Left pleural fluid chest tube drainage.  EXAM: PORTABLE CHEST - 1 VIEW  COMPARISON:  01/30/2013.  CT chest 01/29/2013.  FINDINGS: Large bore chest tube is noted in stable position of the left lower chest. Previously identified left base pleural fluid and air collection remains. The air collection is diminished in size from prior exam. Mild left perihilar atelectasis and  infiltrate noted on today's exam. Right lung is clear. Cardiomegaly. No pulmonary venous congestion.  IMPRESSION: 1. Large bore chest tube is noted over left lower pleural space in stable position. The left base pleural fluid and air collection remain. Air in the left pleural cavity has diminished on today's exam. 2. Mild left perihilar infiltrate.   Electronically Signed   By: Maisie Fus  Register   On: 02/01/2013 07:48   Dg Chest Port 1 View  01/30/2013   CLINICAL DATA:  Status post left-sided chest tube insertion.  EXAM: PORTABLE CHEST - 1 VIEW  COMPARISON:  CT scan of chest dated January 29, 2013.  FINDINGS: A large caliber chest tube is been placed on the left with the tip of the tube to lie between the posterior aspects of the left 8 and 9th ribs medially. There has been interval improvement in the opacity of the left mid and lower hemithorax consistent with drainage of pleural fluid. There is pleural space air now associated with residual fluid and thickened pleura. There is no mediastinal shift.  The cardiac silhouette is top-normal in size. The pulmonary vascularity is not engorged. There is tortuosity of the descending thoracic aorta.  IMPRESSION: The patient has undergone interval drainage of a large amount of of fluid from the left pleural space by placement of a large caliber chest tube. There is residual soft tissue density present and there is pleural space air visible in the lower hemithorax. No apical pneumothorax is demonstrated.   Electronically Signed   By: David  [place]   On: 01/30/2013 16:47    Micro Results      Recent Results (from the past 240 hour(s))  BODY FLUID CULTURE     Status: None   Collection Time    01/30/13  3:37 PM      Result Value Range Status   Specimen Description PLEURAL FLUID LEFT   Final   Special Requests 6.OML FLUID   Final   Gram Stain     Final   Value: ABUNDANT WBC PRESENT,BOTH PMN AND MONONUCLEAR     ABUNDANT GRAM POSITIVE COCCI IN CLUSTERS     Gram  Stain Report Called to,Read Back By and Verified With: Gram Stain Report Called to,Read Back By and Verified With:  JAMES A @813  ON 01/31/13 BY SMIAS     Performed at [facility]   Culture     Final   Value: ABUNDANT STREPTOCOCCUS SPECIES     Note: IDENTIFIED AS STREPTOCOCCUS INTERMEDIUS     Performed at [facility]   Report Status 02/03/2013 FINAL   Final   Organism ID, Bacteria STREPTOCOCCUS SPECIES   Final  SURGICAL PCR SCREEN     Status: None   Collection Time    01/31/13  8:16 PM      Result Value Range Status   MRSA, PCR NEGATIVE  NEGATIVE Final   Staphylococcus aureus  NEGATIVE  NEGATIVE Final   Comment:            The Xpert SA Assay (FDA     approved for NASAL specimens     in patients over 85 years of age),     is one component of     a comprehensive surveillance     program.  Test performance has     been validated by [facility] for patients greater     than or equal to 76 year old.     It is not intended     to diagnose infection nor to     guide or monitor treatment.     History of present illness and  Hospital Course:     Kindly see H&P for history of present illness and admission details, please review complete Labs, Consult reports and Test reports for all details in brief [name], is a 78 y.o. male, patient with history of  ischemic cardiomyopathy with chronic systolic heart failure EF 15% on echo done in January 2014, chronic kidney disease stage III baseline creatinine around 2.4 not on ACE/ARB due to renal insufficiency, chronic hydronephrosis, non-Hodgkin's lymphoma.    Patient was admitted about 10 days ago with chief complaints of shortness of breath, initially his workup was suggestive of acute on chronic systolic heart failure however he had persistent pleural effusions left more than right at which point pulmonary critical care was consulted and he underwent initially thoracentesis where his fluid was suggestive of empyema and  then underwent left-sided chest tube placement. He was kept on IV antibiotics based on his culture and sensitivity results which grew Streptococcus agalactiae, he was seen by cardiothoracic surgery and was requested to undergo VATS procedure which he continued to decline despite several sessions of counseling, finally pulmonary critical care to his chest tube out on 02/07/2013 with a stable followup chest x-ray. I have discussed his case in detail with infectious disease physician Dr. Ninetta Lights and pulmonologist Dr. Kathrin Penner, consensus is that he will be placed on Augmentin for another 4 weeks with close outpatient chest x-ray and followup with pulmonary. His long-term prognosis is extremely poor due to his noncompliance and nonadherence to recommended therapy.    For his chronic systolic heart failure he appears to be compensated and baseline, he will resume as needed Lasix at home. Blood pressure too soft for beta blocker, he has underlying stage III chronic kidney disease with baseline creatinine around 2.4 which prevents [place] from placing him on ACE or ARB.       Today   Subjective:   [name] today has no headache,no chest abdominal pain,no new weakness tingling or numbness, feels much better wants to go home today.    Objective:   Blood pressure 95/52, pulse 80, temperature 98.3 F (36.8 C), temperature source Rectal, resp. rate 17, height 6' (1.829 m), weight 60.6 kg (133 lb 9.6 oz), SpO2 98.00%.   Intake/Output Summary (Last 24 hours) at 02/08/13 1048 Last data filed at 02/08/13 0700  Gross per 24 hour  Intake      0 ml  Output    375 ml  Net   -375 ml    Exam Awake Alert, Oriented *3, No new F.N deficits, Normal affect [place].AT,PERRAL Supple Neck,No JVD, No cervical lymphadenopathy appriciated.  Symmetrical Chest wall movement, Good air movement bilaterally, CTAB RRR,No Gallops,Rubs or new Murmurs, No Parasternal Heave +ve B.Sounds, Abd Soft, Non tender, No organomegaly  appriciated,  No rebound -guarding or rigidity. No Cyanosis, Clubbing or edema, No new Rash or bruise  Data Review   CBC w Diff: Lab Results  Component Value Date   WBC 5.8 02/05/2013   WBC 2.3* 04/26/2006   HGB 8.8* 02/05/2013   HGB 9.9* 04/26/2006   HCT 28.2* 02/05/2013   HCT 29.4* 04/26/2006   PLT 233 02/05/2013   PLT 114* 04/26/2006   LYMPHOPCT 10* 01/29/2013   LYMPHOPCT 21.9 04/26/2006   MONOPCT 5 01/29/2013   MONOPCT 8.7 04/26/2006   EOSPCT 0 01/29/2013   EOSPCT 1.4 04/26/2006   BASOPCT 0 01/29/2013   BASOPCT 0.2 04/26/2006    CMP: Lab Results  Component Value Date   NA 137 02/08/2013   K 4.9 02/08/2013   CL 105 02/08/2013   CO2 24 02/08/2013   BUN 38* 02/08/2013   CREATININE 2.09* 02/08/2013   PROT 6.6 02/01/2013   ALBUMIN 1.7* 02/01/2013   BILITOT 0.3 02/01/2013   ALKPHOS 68 02/01/2013   AST 18 02/01/2013   ALT 15 02/01/2013  .   Total Time in preparing paper work, data evaluation and todays exam - 35 minutes  Leroy Sea M.D on 02/08/2013 at 10:48 AM  [facility] Office  [phone]

## 2013-02-08 NOTE — Progress Notes (Signed)
Clinical Social Work Department CLINICAL SOCIAL WORK PLACEMENT NOTE 02/08/2013  Patient:  FELICE, DEEM  Account Number:  [id number] Admit date:  01/29/2013  Clinical Social Worker:  Carren Rang  Date/time:  02/06/2013 01:39 PM  Clinical Social Work is seeking post-discharge placement for this patient at the following level of care:   SKILLED NURSING   (*CSW will update this form in Epic as items are completed)   02/06/2013  Patient/family provided with [facility] Department of Clinical Social Work's list of facilities offering this level of care within the geographic area requested by the patient (or if unable, by the patient's family).  02/06/2013  Patient/family informed of their freedom to choose among providers that offer the needed level of care, that participate in Medicare, Medicaid or managed care program needed by the patient, have an available bed and are willing to accept the patient.  02/06/2013  Patient/family informed of [facility]' ownership interest in [facility], as well as of the fact that they are under no obligation to receive care at this facility.  PASARR submitted to EDS on 02/06/2013 PASARR number received from EDS on 02/06/2013  FL2 transmitted to all facilities in geographic area requested by pt/family on  02/06/2013 FL2 transmitted to all facilities within larger geographic area on   Patient informed that his/her managed care company has contracts with or will negotiate with  certain facilities, including the following:     Patient/family informed of bed offers received:  02/07/2013 Patient chooses bed at [facility] Physician recommends and patient chooses bed at    Patient to be transferred to [facility] on  02/08/2013 Patient to be transferred to facility by EMS  The following physician request were entered in Epic:   Additional Comments:   Maree Krabbe, MSW, [employer] [phone]

## 2013-02-08 NOTE — Progress Notes (Addendum)
DC tele and IV per order; report was called to [facility]-unavailable call back in 15 minutes; no further questions at this time;     Park Breed

## 2013-02-08 NOTE — Progress Notes (Signed)
Patient ZO:XWRUE Brensinger      DOB: 1934/05/30      AVW:[id number]   Late entry for 11/20 Patient remains closed to VATS procedure.  Nursing reports chest tube removed today in anticipation of SNF placement .  Would recommend PCS to follow at SNF will sign off Please reconsult if his symptoms are exacerbated and we can help with regoals.   Dejha King L. Ladona Ridgel, MD MBA The Palliative Medicine Team at [facility] Phone: [phone] Pager: [phone]

## 2013-02-08 NOTE — Progress Notes (Signed)
[facility] PCCM  CXR reviewed, stable OK to go home from my standpoint PCCM to sign off  Yolonda Kida PCCM Pager: [phone] Cell: [phone] If no response, call [phone]

## 2013-02-08 NOTE — Progress Notes (Signed)
Clinical Social Worker facilitated patient discharge by contacting the patient, family and facility, [facility]. Patient agreeable to this plan and arranging transport via EMS . CSW will sign off, as social work intervention is no longer needed.  Maree Krabbe, MSW, Theresia Majors [phone]

## 2013-02-12 ENCOUNTER — Non-Acute Institutional Stay (SKILLED_NURSING_FACILITY): Payer: Medicare Other | Admitting: Internal Medicine

## 2013-02-12 DIAGNOSIS — R269 Unspecified abnormalities of gait and mobility: Secondary | ICD-10-CM

## 2013-02-12 DIAGNOSIS — I2589 Other forms of chronic ischemic heart disease: Secondary | ICD-10-CM

## 2013-02-12 DIAGNOSIS — J869 Pyothorax without fistula: Secondary | ICD-10-CM

## 2013-02-12 DIAGNOSIS — I255 Ischemic cardiomyopathy: Secondary | ICD-10-CM

## 2013-02-12 DIAGNOSIS — N183 Chronic kidney disease, stage 3 (moderate): Secondary | ICD-10-CM

## 2013-02-19 ENCOUNTER — Non-Acute Institutional Stay (SKILLED_NURSING_FACILITY): Payer: Medicare Other | Admitting: Internal Medicine

## 2013-02-19 DIAGNOSIS — N189 Chronic kidney disease, unspecified: Secondary | ICD-10-CM

## 2013-02-19 DIAGNOSIS — J869 Pyothorax without fistula: Secondary | ICD-10-CM

## 2013-02-19 DIAGNOSIS — D638 Anemia in other chronic diseases classified elsewhere: Secondary | ICD-10-CM

## 2013-02-19 NOTE — Progress Notes (Signed)
Patient ID: [name], male   DOB: [birth year], 78 y.o.   MRN: [id number] Facility; [facility] Chief complaint; followup CHF review of anemia History; this is a patient with multiple medical issues that I admitted to the facility last week. His predominant problem in the hospital was felt to be a left empyema. Cultures of the fluid grew strep. Was recommended that he have a VATS procedure which he refused. He also has a cardiomyopathy with an ejection fraction of 20%. His echocardiogram showed global hypokinesis.   Lab work today we have done in the facility shows a hemoglobin of 7.6 which is normochromic and normocytic. It would appear that in the hospital his hemoglobin ran between 8 and 9. Lab work from November 12 showed an iron of 12, TIBC of 140, ferritin of 406, 4 folate of 13.4 and a B12 of 730. He does have chronic renal insufficiency which I am assuming is the etiology of the likely anemia of chronic disease. With regards to his BUN and creatinine at this is a 43 and 2.17 this seems to be stable level since it was present for most of his hospitalization.  Review of systems Respiratory; admits to some shortness of breath but no cough and he has not been febrile Cardiac no real exertional chest symptoms I am able to elicit Hematology; he has no knowledge of any prior problems with his hemoglobin. No rectal bleeding  Physical examination  Respiratory; there is decreased air entry in the left lower lobe vs. the right however this does not appear to be any different from last week Cardiac heart sounds are normal there is no gallops his JVP is not elevated he appears to be euvolemic Extremities there is no edema  Impression/plan #1 left-sided empyema this appears to be stable clinically. He is afebrile and continuing on his Augmentin #2 chronic renal insufficiency at this point this appears to be stable #3 cardiomyopathy there is no evidence of CHF at the bedside and he appears to be  euvolemic he is on Lasix 40 mg a day and an 1800 cc fluid restriction  I will try to followup on his weights next week his anemia appears to be anemia of chronic disease presumably secondary to his renal insufficiency.  He is to followup with pulmonology I believe with regards to the underlying pleural empyema however currently he appears to be stable                      [place], [place] [address]                         [phone]   ------------------------------------------------------------ Transthoracic Echocardiography  Patient:    [name], [name] MR #:       [id number] Study Date: 02/01/2013 Gender:     M Age:        55 Height:     182.9cm Weight:     56.7kg BSA:        1.62m^2 Pt. Status: Room:       2W30C    PERFORMING   [place], [facility]     [place], [place], [place]  ATTENDING    Marinda Elk  SONOGRAPHER  Arvil Chaco cc:  ------------------------------------------------------------ LV EF: 20%  ------------------------------------------------------------ Indications:      Pre-op evaluation V728.1.  ------------------------------------------------------------ History:   PMH:  Left Pleural Effusion. PVCs.  Atrial fibrillation.  Coronary artery disease.  Congestive heart failure.  ------------------------------------------------------------ Study Conclusions  - Left ventricle: The cavity size was mildly dilated. Wall   thickness was normal. The estimated ejection fraction was   20%. Diffuse hypokinesis. Doppler parameters are   consistent with abnormal left ventricular relaxation   (grade 1 diastolic dysfunction). - Aortic valve: Poorly visualized. There was no stenosis.   Probably moderate eccentric aortic insufficiency. - Aorta: Aortic root dimension:31mm (ED). - Aortic root: The aortic root was severely dilated. - Mitral valve: Mildly to moderately calcified annulus.   Normal thickness  leaflets . Mild regurgitation. - Right ventricle: The cavity size was normal. Systolic   function was normal. - Right atrium: The atrium was mildly dilated. - Tricuspid valve: Peak RV-RA gradient:67mm Hg (S). - Pulmonary arteries: PA peak pressure: 38mm Hg (S). - Inferior vena cava: The vessel was normal in size; the   respirophasic diameter changes were in the normal range (=   50%); findings are consistent with normal central venous   pressure. - Pericardium, extracardiac: There was a pleural effusion.   Small posterior pericardial effusion. Impressions:  - Mildly dilated LV with severe global hypokinesis, EF 20%.   The aortic valve was poorly visualized but there appeared   to be moderate eccentric aortic insufficiency. The aortic   root was severely dilated. Mild MR. Normal RV size and   systolic function. Mild pulmonary hypertension. Transthoracic echocardiography.  M-mode, complete 2D, spectral Doppler, and color Doppler.  Height:  Height: 182.9cm. Height: 72in.  Weight:  Weight: 56.7kg. Weight: 124.7lb.  Body mass index:  BMI: 17kg/m^2.  Body surface area:    BSA: 1.45m^2.  Blood pressure:     97/57.  Patient status:  Inpatient.  ------------------------------------------------------------  ------------------------------------------------------------ Left ventricle:  The cavity size was mildly dilated. Wall thickness was normal. The estimated ejection fraction was 20%. Diffuse hypokinesis. Doppler parameters are consistent with abnormal left ventricular relaxation (grade 1 diastolic dysfunction).  ------------------------------------------------------------ Aortic valve:  Poorly visualized.  Doppler:   There was no stenosis.   Probably moderate eccentric aortic insufficiency.  ------------------------------------------------------------ Aorta:  Aortic root: The aortic root was severely dilated.  ------------------------------------------------------------ Mitral  valve:   Mildly to moderately calcified annulus. Normal thickness leaflets .  Doppler:   There was no evidence for stenosis.    Mild regurgitation.  ------------------------------------------------------------ Left atrium:  The atrium was normal in size.  ------------------------------------------------------------ Right ventricle:  The cavity size was normal. Systolic function was normal.  ------------------------------------------------------------ Pulmonic valve:   Poorly visualized.  ------------------------------------------------------------ Tricuspid valve:   Doppler:   Mild regurgitation.  ------------------------------------------------------------ Right atrium:  The atrium was mildly dilated.  ------------------------------------------------------------ Pericardium:  There was a pleural effusion. Small posterior pericardial effusion.  ------------------------------------------------------------ Systemic veins: Inferior vena cava: The vessel was normal in size; the respirophasic diameter changes were in the normal range (= 50%); findings are consistent with normal central venous pressure.  EXAM: CHEST  2 VIEW   COMPARISON:  Prior chest x-ray 02/07/2013; prior CT chest 01/29/2013   FINDINGS: Stable loculated left basilar pneumothorax ex vacuo. Persistent pleural thickening and chronic atelectatic changes in the left lower lobe and inferior lingula. Stable cardiomegaly and tortuous and atherosclerotic thoracic aorta. Inspiratory volumes are lower and there is increased streaky opacity in the right perihilar and right basilar regions most likely representing atelectasis. No acute osseous abnormality.   IMPRESSION: 1. Stable appearance of the loculated left basilar pneumothorax ex vacuo with associated chronic pleural thickening and persistent left  lower lobe and inferior lingular atelectasis. 2. Lower inspiratory volumes with increased right perihilar  and basilar subsegmental atelectasis.

## 2013-02-21 ENCOUNTER — Telehealth: Payer: Self-pay | Admitting: Pulmonary Disease

## 2013-02-21 NOTE — Telephone Encounter (Signed)
Error.[name] ° °

## 2013-02-22 ENCOUNTER — Encounter: Payer: Self-pay | Admitting: Adult Health

## 2013-02-22 ENCOUNTER — Non-Acute Institutional Stay (SKILLED_NURSING_FACILITY): Payer: Medicare Other | Admitting: Adult Health

## 2013-02-22 DIAGNOSIS — J90 Pleural effusion, not elsewhere classified: Secondary | ICD-10-CM

## 2013-02-22 DIAGNOSIS — J869 Pyothorax without fistula: Secondary | ICD-10-CM

## 2013-02-22 NOTE — Progress Notes (Signed)
Patient ID: [name], male   DOB: December 18, 1934, 78 y.o.   MRN: [id number]     [place]  Allergies  Allergen Reactions  . Contrast Media [Iodinated Diagnostic Agents] Other (See Comments)    Patient does not want dye because it is hard on his kidneys  . Prednisone Rash  . Shellfish Allergy Itching and Rash    Crab meat     Chief Complaint  Patient presents with  . Acute Visit    chest tube site drainage     HPI: I have been asked to seen him regarding his old chest tube site drainage. Yesterday he had a very large amount of purulent drainage present; the area was swollen; with redness present. Today there is some redness present; there is minimal swelling present and there is no drainage present. He is not voicing any pain; there is no shortness of breath present. He does have a cough present with sputum which is clear to yellow. There are no reports of fever present. He is due to go to pulmonology on 02-25-13.   Past Medical History  Diagnosis Date  . Aortic insufficiency     a. Previously mod-severe;  b. 03/2012 Echo: Mild to Mod AI  . Atrial flutter     resolved  . Anemia   . PVCs (premature ventricular contractions)   . Chronic systolic CHF (congestive heart failure)     a.  03/2012 Echo: EF 15%, Mild to Mod AI, Mod MR  . Bilateral hydronephrosis     a. 03/2012 Renal u/s: Bilat Severe hydronephrosis, medical renal dzs.  . CKD (chronic kidney disease), stage III   . Non Hodgkin's lymphoma     resolved >10 yr ago  . Moderate mitral regurgitation     a.  03/2012 Echo: Mod MR  . BPH (benign prostatic hyperplasia)     a. noted on renal u/s 03/2012.  . Ischemic cardiomyopathy     a.  Lexiscan Myoview 04/24/12: Inferior apical MI, study not gated due to frequent PVCs, triplets => patient refuses cath  . Gout   . Cardiomyopathy, ischemic 04/24/2012  . Acute systolic CHF (congestive heart failure) 04/17/2012  . Atrial fibrillation 02/02/2013  . CAD (coronary artery disease)  04/24/2012  . Chronic systolic heart failure 04/24/2012  . Acute respiratory failure 01/31/2013  . CAP (community acquired pneumonia) 01/30/2013  . Pleural effusion, left 01/29/2013  . Empyema 01/30/2013  . Protein-calorie malnutrition, severe 01/31/2013    Past Surgical History  Procedure Laterality Date  . Lymph node dissection      Groin  . Inguinal hernia repair      VITAL SIGNS BP 93/58  Pulse 86  Ht 6' (1.829 m)  Wt 142 lb (64.411 kg)  BMI 19.25 kg/m2   Patient's Medications  New Prescriptions   No medications on file  Previous Medications   DOCUSATE SODIUM (COLACE) 100 MG CAPSULE    Take 100 mg by mouth 2 (two) times daily.   FEEDING SUPPLEMENT, ENSURE COMPLETE, (ENSURE COMPLETE) LIQD    Take 237 mLs by mouth daily after supper.   FERROUS SULFATE 325 (65 FE) MG TABLET    Take 325 mg by mouth 2 (two) times daily with a meal.   FUROSEMIDE (LASIX) 40 MG TABLET    Take 40 mg by mouth daily as needed for fluid (for weight gain of 3 pounds or more).    HYDROCODONE-ACETAMINOPHEN (NORCO/VICODIN) 5-325 MG PER TABLET    Take 1 tablet by mouth every 4  hours as needed for mild pain *DO NOT EXCEED 4 GM OF TYLENOL IN 24 HOURS*; Take 2 tablets by mouth every 4 hours as needed for moderate to severe pain   NITROGLYCERIN (NITROSTAT) 0.4 MG SL TABLET    Place 1 tablet (0.4 mg total) under the tongue every 5 (five) minutes x 3 doses as needed for chest pain.   SACCHAROMYCES BOULARDII (FLORASTOR) 250 MG CAPSULE    Take 250 mg by mouth 2 (two) times daily.   TAMSULOSIN (FLOMAX) 0.4 MG CAPS CAPSULE    Take 1 capsule (0.4 mg total) by mouth daily after supper.   TRIAMCINOLONE CREAM (KENALOG) 0.1 %    Apply 1 application topically daily as needed. For rash  Modified Medications   Modified Medication Previous Medication   AMOXICILLIN-CLAVULANATE (AUGMENTIN) 875-125 MG PER TABLET amoxicillin-clavulanate (AUGMENTIN) 875-125 MG per tablet      Take 1 tablet by mouth 2 (two) times daily.    Take 1  tablet by mouth 2 (two) times daily.  Discontinued Medications   No medications on file    SIGNIFICANT DIAGNOSTIC EXAMS  01-29-13: chest x-ray: There has been interval increase in the size of the left pleural effusion. No significant effusion on the right is demonstrated today.  01-29-13: renal ultrasound: 1. Interval improvement in severe bilateral hydronephrosis. There is residual right pelvocaliectasis and moderate left hydronephrosis. 2. Bilateral renal cysts. 3. Prostate gland enlargement.     01-29-13: ct of chest: The patient has undergone interval drainage of a large amount of of fluid from the left pleural space by placement of a large caliber chest tube. There is residual soft tissue density present and there is pleural space air visible in the lower hemithorax. No apical pneumothorax is demonstrated.   02-01-13: TEE: Mildly dilated LV with severe global hypokinesis, EF 20%. The aortic valve was poorly visualized but there appeared to be moderate eccentric aortic insufficiency. The aortic root was severely dilated. Mild MR. Normal RV size and systolic function. Mild pulmonary hypertension.  02-08-13: chest x-ray: 1. Stable appearance of the loculated left basilar pneumothorax ex vacuo with associated chronic pleural thickening and persistent left lower lobe and inferior lingular atelectasis. 2. Lower inspiratory volumes with increased right perihilar and basilar subsegmental atelectasis.   02-22-13: chest x-ray: left sided infiltrate with basilar consolidation as well as a left effusion.   LABS REVIEWED;   02-11-13: wbc 5.1; hgb 7.8; hct 24.4 ;mcv 85.6 plt 188; glucose 75; bun 13; creat 2.38; k+5.0; na++140; live rnormal albumin 2.2; ca++ 8.1  02-18-13: wbc 6.3; hgb 7.6; hct 24.2; mcv 87.1;plt 191; glucose 84; bun 43; creat 2.17; k+4.9; na++138; liver normal albumin 2.2; ca++ 7.8     Review of Systems  Constitutional: Negative for malaise/fatigue.  Eyes: Negative for blurred  vision.  Respiratory: Positive for cough, sputum production and shortness of breath.        Sputum is clear to yellow  Cardiovascular: Negative for chest pain, palpitations and leg swelling.  Gastrointestinal: Negative for heartburn, abdominal pain and constipation.  Musculoskeletal: Negative for back pain, joint pain and myalgias.  Skin: Negative.        Chest tube site has had large amount of drainage   Neurological: Negative for dizziness, weakness and headaches.  Psychiatric/Behavioral: Negative for depression. The patient is not nervous/anxious and does not have insomnia.    Physical Exam  Constitutional: He is oriented to person, place, and time. No distress.  frail  Neck: Neck supple. No JVD present.  Cardiovascular: Normal rate, regular rhythm and intact distal pulses.   Respiratory: Effort normal. No respiratory distress.  Has left lower lung diminished breath sounds present.   GI: Soft. Bowel sounds are normal. He exhibits no distension. There is no tenderness.  Musculoskeletal: Normal range of motion. He exhibits no edema.  Neurological: He is alert and oriented to person, place, and time.  Skin: Skin is warm and dry. He is not diaphoretic.  Left tube site slightly red and warm no drainage present. Minimal  swelling present. By the markings left by the nursing staff is significantly smaller in size from yesterday.   Psychiatric: He has a normal mood and affect.       ASSESSMENT/ PLAN:  1.  Left pleural effusion; empyema: will continue his Augmentin as ordered; will culture his chest tube site if drainage starts once again.  Will continue to monitor him for signs of infection. His chest x-ray does appear to show any significant change.   Time spent with patient 40 minutes.

## 2013-02-25 ENCOUNTER — Ambulatory Visit (INDEPENDENT_AMBULATORY_CARE_PROVIDER_SITE_OTHER): Payer: Medicare Other | Admitting: Internal Medicine

## 2013-02-25 ENCOUNTER — Ambulatory Visit (INDEPENDENT_AMBULATORY_CARE_PROVIDER_SITE_OTHER)
Admission: RE | Admit: 2013-02-25 | Discharge: 2013-02-25 | Disposition: A | Discharge status: Home / Self Care | Payer: Medicare Other | Source: Ambulatory Visit | Attending: Internal Medicine | Admitting: Internal Medicine

## 2013-02-25 ENCOUNTER — Encounter: Payer: Self-pay | Admitting: Internal Medicine

## 2013-02-25 VITALS — BP 126/62 | HR 126 | Temp 97.6°F | Ht 72.0 in | Wt 145.5 lb

## 2013-02-25 DIAGNOSIS — J90 Pleural effusion, not elsewhere classified: Secondary | ICD-10-CM

## 2013-02-25 NOTE — Assessment & Plan Note (Signed)
Most likely this is complex parapneumonic process/ empyema in never smoker with strep cultured from the previous study.  Nothing else to recommend from this office > ? pleurex per  [facility] (offered VATS by Tyrone Sage but refused).  In meantime don't see indication to continue abx at this point.

## 2013-02-25 NOTE — Patient Instructions (Addendum)
Please remember to go to the x-ray department downstairs for your tests - we will call Dr Leanord Hawking with the results when they are available and make additional recommendations then - in meantime no change in treatment plan needed     Please schedule a follow up office visit in 4 weeks, sooner if needed with Dr Craige Cotta or Babette Relic NP

## 2013-02-25 NOTE — Progress Notes (Signed)
Quick Note:  Spoke with pt and notified of results per Dr. Wert. Pt verbalized understanding and denied any questions.  ______ 

## 2013-02-25 NOTE — Progress Notes (Signed)
Subjective:     Patient ID: [name], male   DOB: 11-17-1934, 78 y.o.   MRN: [id number]  HPI  Admission date: 01/29/2013 Admitting Physician Alba Cory, MD  Discharge Date: 02/08/2013  Primary MD [name], MD  Recommendations for primary care physician for things to follow:  Follow chest x-ray, make sure patient follows with pulmonary on a close basis  Admission Diagnosis Bilateral hydronephrosis [591]  Acute systolic CHF (congestive heart failure) [428.21, 428.0]  Acute dyspnea [786.09]  Discharge Diagnosis left-sided empyema  Principal Problem:  Pleural effusion, left  Active Problems:  Aortic insufficiency  CKD (chronic kidney disease), stage III  Acute systolic CHF (congestive heart failure)  Bilateral hydronephrosis  Cardiomyopathy, ischemic  CAD (coronary artery disease)  CAP (community acquired pneumonia)  Empyema  Paroxysmal a-fib  Acute respiratory failure  Trapped lung  Protein-calorie malnutrition, severe  Atrial fibrillation   02/25/2013 post hosp f/u ov/Ayauna Mcnay re: cpa/ empyema in never smoker Chief Complaint  Patient presents with  . HFU    Pt states that his breathing is much improved since hospital d/c. He has prod cough with clear to yellow sputum.   cough started  Toward end of October 2014, then admit as above and improved p dc/ to SNF  Except still has rattling cough, min white mucus,  ex daily limited  bystrength and balance  no 02  No obvious day to day or daytime variabilty or assoc   cp or chest tightness, subjective wheeze overt sinus or hb symptoms. No unusual exp hx or h/o childhood pna/ asthma or knowledge of premature birth.  Sleeping ok without nocturnal  or early am exacerbation  of respiratory  c/o's or need for noct saba. Also denies any obvious fluctuation of symptoms with weather or environmental changes or other aggravating or alleviating factors except as outlined above   Current Medications, Allergies, Complete Past  Medical History, Past Surgical History, Family History, and Social History were reviewed in [employer] record.  ROS  The following are not active complaints unless bolded sore throat, dysphagia, dental problems, itching, sneezing,  nasal congestion or excess/ purulent secretions, ear ache,   fever, chills, sweats, unintended wt loss, pleuritic or exertional cp, hemoptysis,  orthopnea pnd or leg swelling, presyncope, palpitations, heartburn, abdominal pain, anorexia, nausea, vomiting, diarrhea  or change in bowel or urinary habits, change in stools or urine, dysuria,hematuria,  rash, arthralgias, visual complaints, headache, numbness weakness or ataxia or problems with walking or coordination,  change in mood/affect or memory.          Review of Systems     Objective:   Physical Exam   Wt Readings from Last 3 Encounters:  02/25/13 145 lb 8 oz (65.998 kg)  02/22/13 142 lb (64.411 kg)  02/08/13 133 lb 9.6 oz (60.6 kg)      W/c bound   HEENT: nl dentition, turbinates, and orophanx. Nl external ear canals without cough reflex   NECK :  without JVD/Nodes/TM/ nl carotid upstrokes bilaterally   LUNGS: no acc muscle use, decreased bs with dullness L base, chest tube side with dry bandage    CV:  RRR  no s3 or murmur or increase in P2,  2 Plus sym pitting edema   ABD:  soft and nontender with nl excursion in the supine position. No bruits or organomegaly, bowel sounds nl  MS:  warm without deformities, calf tenderness, cyanosis or clubbing  SKIN: warm and dry without lesions  NEURO:  alert, approp, no deficits     CXR  02/25/2013 :   moderate to large pleural effusion. Associated parenchymal lung opacity may reflect atelectasis, infiltrate or a combination. 2. Stable changes of COPD and cardiomegaly.       Assessment:

## 2013-02-26 ENCOUNTER — Non-Acute Institutional Stay (SKILLED_NURSING_FACILITY): Payer: Medicare Other | Admitting: Internal Medicine

## 2013-02-26 DIAGNOSIS — N189 Chronic kidney disease, unspecified: Secondary | ICD-10-CM

## 2013-02-26 DIAGNOSIS — J90 Pleural effusion, not elsewhere classified: Secondary | ICD-10-CM

## 2013-02-26 DIAGNOSIS — J869 Pyothorax without fistula: Secondary | ICD-10-CM

## 2013-02-26 DIAGNOSIS — D638 Anemia in other chronic diseases classified elsewhere: Secondary | ICD-10-CM

## 2013-02-26 NOTE — Progress Notes (Signed)
Patient ID: [name], male   DOB: [birth year], 78 y.o.   MRN: [id number] Facility; [facility] Chief complaint; review of pleural effusion, CHF, anemia History; this is a patient with multiple medical issues that I admitted to the facility last month. His predominant problem in the hospital was felt to be a left empyema. Cultures of the fluid grew strep. Was recommended that he have a VATS procedure which he refused. He also has a cardiomyopathy with an ejection fraction of 20%. His echocardiogram showed global hypokinesis.   Lab work today we have done in the facility shows a hemoglobin of 7.6 which is normochromic and normocytic. It would appear that in the hospital his hemoglobin ran between 8 and 9. Lab work from November 12 showed an iron of 12, TIBC of 140, ferritin of 406, 4 folate of 13.4 and a B12 of 730. He does have chronic renal insufficiency which I am assuming is the etiology of the likely anemia of chronic disease. With regards to his BUN and creatinine at this is a 43 and 2.17 this seems to be stable level since it was present for most of his hospitalization.  He went to see Dr. Sandrea Hughs of pulmonology yesterday. A followup chest x-ray [report below] shows a increasingly large moderate to severe left-sided pleural effusion. He is recommended to a Pleurx catheter and/or reconsideration of the VATS referred back to thoracic surgery.  Last week he developed increasing drainage coming out of his original chest tube site. This was cultured however it did not show any growth.  Review of systems Respiratory; admits to some shortness of breath with occasional loose cough. I've seen him up walking in the hall with a walker he does not appear to be that limited Cardiac no real exertional chest symptoms I am able to elicit Hematology; he has no knowledge of any prior problems with his hemoglobin. No rectal bleeding  Physical examination  Respiratory; there is decreased air entry and  dullness to percussion over the left lower lobe with bronchial breath sounds Cardiac heart sounds are normal there is no gallops his JVP is not elevated he appears to be euvolemic Extremities there is no edema  Impression/plan #1 left-sided empyema; this is changed fairly dramatically in appearance. See chest x-ray but will #2 chronic renal insufficiency at this point this appears to be stable. He has anemia of chronic disease, no doubt he is probably going to require Procrit or Aranesp #3 cardiomyopathy there is no evidence of CHF at the bedside and he appears to be euvolemic he is on Lasix 40 mg a day and an 1800 cc fluid restriction. For now the issue of congestive heart failure seems quite stable   ------------------------------------------------------------ Transthoracic Echocardiography  Patient:    [name], [name] MR #:       [id number] Study Date: 02/01/2013 Gender:     M Age:        89 Height:     182.9cm Weight:     56.7kg BSA:        1.31m^2 Pt. Status: Room:       2W30C    PERFORMING   [place], [facility]     [place], [place], [place]  ATTENDING    Marinda Elk  SONOGRAPHER  Arvil Chaco cc:  ------------------------------------------------------------ LV EF: 20%  ------------------------------------------------------------ Indications:      Pre-op evaluation V728.1.  ------------------------------------------------------------ History:   PMH:  Left Pleural Effusion. PVCs.  Atrial fibrillation.  Coronary artery disease.  Congestive heart failure.  ------------------------------------------------------------ Study Conclusions  - Left ventricle: The cavity size was mildly dilated. Wall   thickness was normal. The estimated ejection fraction was   20%. Diffuse hypokinesis. Doppler parameters are   consistent with abnormal left ventricular relaxation   (grade 1 diastolic dysfunction). -  Aortic valve: Poorly visualized. There was no stenosis.   Probably moderate eccentric aortic insufficiency. - Aorta: Aortic root dimension:32mm (ED). - Aortic root: The aortic root was severely dilated. - Mitral valve: Mildly to moderately calcified annulus.   Normal thickness leaflets . Mild regurgitation. - Right ventricle: The cavity size was normal. Systolic   function was normal. - Right atrium: The atrium was mildly dilated. - Tricuspid valve: Peak RV-RA gradient:72mm Hg (S). - Pulmonary arteries: PA peak pressure: 38mm Hg (S). - Inferior vena cava: The vessel was normal in size; the   respirophasic diameter changes were in the normal range (=   50%); findings are consistent with normal central venous   pressure. - Pericardium, extracardiac: There was a pleural effusion.   Small posterior pericardial effusion. Impressions:  - Mildly dilated LV with severe global hypokinesis, EF 20%.   The aortic valve was poorly visualized but there appeared   to be moderate eccentric aortic insufficiency. The aortic   root was severely dilated. Mild MR. Normal RV size and   systolic function. Mild pulmonary hypertension. Transthoracic echocardiography.  M-mode, complete 2D, spectral Doppler, and color Doppler.  Height:  Height: 182.9cm. Height: 72in.  Weight:  Weight: 56.7kg. Weight: 124.7lb.  Body mass index:  BMI: 17kg/m^2.  Body surface area:    BSA: 1.56m^2.  Blood pressure:     97/57.  Patient status:  Inpatient.  ------------------------------------------------------------  ------------------------------------------------------------ Left ventricle:  The cavity size was mildly dilated. Wall thickness was normal. The estimated ejection fraction was 20%. Diffuse hypokinesis. Doppler parameters are consistent with abnormal left ventricular relaxation (grade 1 diastolic dysfunction).  ------------------------------------------------------------ Aortic valve:  Poorly visualized.   Doppler:   There was no stenosis.   Probably moderate eccentric aortic insufficiency.  ------------------------------------------------------------ Aorta:  Aortic root: The aortic root was severely dilated.  ------------------------------------------------------------ Mitral valve:   Mildly to moderately calcified annulus. Normal thickness leaflets .  Doppler:   There was no evidence for stenosis.    Mild regurgitation.  ------------------------------------------------------------ Left atrium:  The atrium was normal in size.  ------------------------------------------------------------ Right ventricle:  The cavity size was normal. Systolic function was normal.  ------------------------------------------------------------ Pulmonic valve:   Poorly visualized.  ------------------------------------------------------------ Tricuspid valve:   Doppler:   Mild regurgitation.  ------------------------------------------------------------ Right atrium:  The atrium was mildly dilated.  ------------------------------------------------------------ Pericardium:  There was a pleural effusion. Small posterior pericardial effusion.  ------------------------------------------------------------ Systemic veins: Inferior vena cava: The vessel was normal in size; the respirophasic diameter changes were in the normal range (= 50%); findings are consistent with normal central venous pressure.  cough.   EXAM: CHEST  2 VIEW   COMPARISON:  02/08/2013   FINDINGS: Loculated pneumothorax noted in the left lateral lung base from the prior study is no longer evident. This area is opacified likely filled in with pleural fluid. There is now a moderate to large pleural effusion extending to the mid hemi thorax. Adjacent parenchymal lung opacity is likely atelectasis. Infiltrate is possible.   Right lung is hyperexpanded. Mild reticular scarring or subsegmental atelectasis is noted at the right  lung base. There are changes of emphysema  in both lung apices.   Cardiac silhouette is partly obscured. Appears mildly enlarged. No gross mediastinal mass is seen.   IMPRESSION: 1. Since the prior eggs study, the loculated pneumothorax at the left lung base is no longer evident. There is now a moderate to large pleural effusion. Associated parenchymal lung opacity may reflect atelectasis, infiltrate or a combination. 2. Stable changes of COPD and cardiomegaly.

## 2013-02-28 ENCOUNTER — Telehealth: Payer: Self-pay | Admitting: Internal Medicine

## 2013-02-28 ENCOUNTER — Encounter: Payer: Self-pay | Admitting: *Deleted

## 2013-02-28 NOTE — Telephone Encounter (Signed)
Pt's brother is aware of MW recs. He is going to call Dr. Dennie Maizes office today.

## 2013-02-28 NOTE — Telephone Encounter (Signed)
Spoke with [facility]-- Requesting an order to D/C abx. Abx was discontinued at last OV 02/25/13 but was not printed on the AVS. [facility] refused to take a verbal order to D/C even though it is written in your note.  Please see below and give order please so that I may fax it.  Pleural effusion, left - Nyoka Cowden, MD at 02/25/2013 1:39 PM     Status: Written Related Problem: Pleural effusion, left    Most likely this is complex parapneumonic process/ empyema in never smoker with strep cultured from the previous study.  Nothing else to recommend from this office > ? pleurex per T Surgery (offered VATS by Tyrone Sage but refused).  In meantime don't see indication to continue abx at this point.    Please advise Dr Sherene Sires. Thanks.

## 2013-02-28 NOTE — Telephone Encounter (Signed)
I have written a letter for the d/c use of antibiotic. This will be faxed over the [facility]

## 2013-02-28 NOTE — Telephone Encounter (Signed)
Spoke with pt's brother. He was calling in regards to the CXR that the pt had done. Pt was told that he would need fluid taken off his lung. His brother is wanting to know when this will be done. I advised him that MW stated that this wasn't something that needed to be done right away. The pt had a hard time sleeping last night due to SOB.  MW - should we get the pt set up for OV or thoracentesis?

## 2013-02-28 NOTE — Telephone Encounter (Signed)
Records indicate he had refused the recs by Dr Tyrone Sage made during the hospitalization for VATS but this would be the best way to go and since there is no medication I can offer to fix the problem, I rec he set up appt with gerhardt asap if want help with it  If not able to schedule this w/in a week, we could try a therapeutic tap per our inpt team as a second option

## 2013-02-28 NOTE — Progress Notes (Signed)
Patient ID: [name], male   DOB: 05-15-1934, 78 y.o.   MRN: [id number]           HISTORY & PHYSICAL  DATE:  02/12/2013    FACILITY: [facility]    LEVEL OF CARE:   SNF   HISTORY OF PRESENT ILLNESS:  Mr. Scullion is a 78 year-old male who states he was reasonably well up until several days before admission when he developed increasing shortness of breath, cough.  He was initially admitted to hospital with a diagnosis of chronic systolic heart failure.  However, he had persistent pleural effusion, left more than right, at which time he was seen by Pulmonology and underwent a thoracentesis which revealed fluid that was more suggestive of an empyema.  He underwent a left-sided chest tube placement.  He was kept on IV antibiotics based on his culture and sensitivity, which grew group A strep.    He was seen by Cardiothoracic Surgery and requested to undergo a VATS.  However, the patient refused based on the sensation that he was too frail and sick for a surgical procedure.  Therefore, Critical Care removed his chest tube on November 20th, with a stable follow-up chest x-ray.  The final antibiotic choices were discussed with Dr. Ninetta Lights and the pulmonologist.  The consensus was that he would be placed on Augmentin for a further four weeks with outpatient chest x-rays and follow-up with Pulmonary.    He is listed as being "noncompliant and nonadherent".    PAST MEDICAL HISTORY/PROBLEM LIST:  Left pleural effusion compatible with an empyema.  Cultures apparently grew group A strep.    Chronic renal failure stage III.     At some point listed as having bilateral hydronephrosis.  At this point, I do not have access to [facility].  Therefore, I cannot research this further.     Acute systolic congestive heart failure.    Ischemic cardiomyopathy.    Coronary artery disease.    Paroxysmal atrial fib.    Acute respiratory failure.    Trapped lung.    Protein calorie malnutrition,  severe.    History of atrial fibrillation.      Mild to moderate aortic insufficiency.    Moderate mitral regurgitation.    CURRENT MEDICATIONS:  Discharge medications include:    Augmentin 875 b.i.d.    Lasix 40 mg daily.    Norco 1-2 tablets every 4 hours as needed for pain.    Nitroglycerin p.r.n.    Flomax 0.4 q.d.    Triamcinolone 0.1% topically as needed.     SOCIAL HISTORY: HOUSING:  The patient states he lives on his own in [place].   CHILDREN:  He has a daughter who lives nearby.   FUNCTIONAL STATUS:  However, he claims to be independent with ADLs and IADLs.    FAMILY HISTORY:  None related by the patient.    REVIEW OF SYSTEMS:   CHEST/RESPIRATORY:  The patient states he has some loose cough productive of a clear sputum.  He has not complained of chest pain.     CARDIAC:   No exertional chest symptoms.  He is unaware of the severity of his heart failure.  Follows with Dr. Swaziland.   GI:  No nausea, no vomiting.  No abdominal pain.  No dysphagia.  He feels hungry.   GU:  No dysuria or hematuria.   MUSCULOSKELETAL:  He does not complain of pain.  However, he states his legs are very weak.    PHYSICAL  EXAMINATION:   VITAL SIGNS:   PULSE:  77, and it feels like atrial fibrillation.   RESPIRATIONS:  18.  There is no respiratory distress.   O2 SATURATIONS:  91% on room air.   GENERAL APPEARANCE:   Pleasant man in no distress.   HEENT:  Normal exam.   MOUTH/THROAT:   No oral lesions are seen.  Good dentition.   CHEST/RESPIRATORY:  There is decreased air entry with dullness to percussion.   Decreased fremitus and decreased chest excursion on the left side.  Right lung is more clear.   CARDIOVASCULAR:  CARDIAC:  Heart sounds are somewhat distant.  I think he is still in atrial fibrillation.  There is a pansystolic murmur over the PMI.  Probable signs of cardiac enlargement.  There are no convincing signs of congestive heart failure at the bedside.      GASTROINTESTINAL:  LIVER/SPLEEN/KIDNEYS:  No liver, no spleen.  No tenderness is noted.   LYMPHATICS:  None palpable in the cervical, clavicular, or axillary areas.   GENITOURINARY:  BLADDER:   No suprapubic or costovertebral angle tenderness.   CIRCULATION:   EDEMA/VARICOSITIES:  Extremities:  He has minimal to mild pedal edema.   ARTERIAL:  Peripheral pulses are palpable.   MUSCULOSKELETAL:   EXTREMITIES:  No active joints.   NEUROLOGICAL:    CRANIAL NERVES:  Cranial nerves seem intact.   MOTOR:  Motor, power and tone in the upper extremities is normal.  In the lower extremities, he has 3-3+/5 hip flexor and abductor.  Distal strength is intact.   DEEP TENDON REFLEXES:  Reflexes at the knee jerks are preserved.  He has a left extensor plantar.  Right is equivocal to extensor, as well.   PSYCHIATRIC:   MENTAL STATUS:   I really detected no abnormalities here.  The patient seems aware of his medical conditions.  Was able to tell me that he had "strep".    LABORATORY DATA:   Lab work from 02/08/2013 showed a sodium of 141, potassium of 3.8, chloride 107, and CO2 of 25.    BUN is 41, creatinine 2.3, comparing to 38 and 2.9 on November 21st, presumably before he left the hospital.      A lipid panel showed an LDL of 132.    PSA 0.22.    ASSESSMENT/PLAN:  Left-sided empyema with group A strep.  He is currently on Augmentin after completing IV antibiotics in the hospital.  He  apparently refused a VATS procedure, feeling that he was too weak to undergo surgery.  He is now on Augmentin for a month.  I will see how he does, both clinically and radiographically.  He states he is feeling better and actually might reconsider his decisions if that is needed.    Chronic renal insufficiency.  Listed at some point as having bilateral hydronephrosis.  I do not have any current information on this.  I do not have access to [facility] at the time of this dictation.    Systolic congestive heart  failure with a history of valvular disease.  There is no heart failure at the bedside.  His heart sounds are distant.  There is a mitral regurgitant murmur, but no convincing evidence of fluid overload.    Coronary artery disease with a history of ischemic cardiomyopathy.  This does not appear to be active, although he is not on much medication.    Atrial fibrillation.  I would not be surprised if he is in that currently, although  his heart rate is controlled.    Severe protein calorie malnutrition.  I do not actually see an index of this currently.    History of non-Hodgkin's lymphoma.  Resolved many years ago, greater than 10.    Stage III chronic kidney failure.    In spite of an impressive problem list, this man is able to get up and stand.  His balance is poor and his lower extremities are weak.  A lot of this may be disuse.     We will have to see how he does clinically and radiographically.  I plan to send him back to [facility] next week for a follow-up PA and lateral chest x-ray.  He also has follow-up with Pulmonology.    The patient's current white count is normal at 5.1.  His hemoglobin is 7.8.    He may have received IV fluid already.  I really cannot tell, nor can I be completely certain at the moment of his medication list.  I will need to verify with the staff.

## 2013-02-28 NOTE — Telephone Encounter (Signed)
Consider it ordered.

## 2013-03-01 ENCOUNTER — Other Ambulatory Visit: Payer: Self-pay | Admitting: *Deleted

## 2013-03-01 ENCOUNTER — Encounter: Payer: Self-pay | Admitting: Cardiothoracic Surgery

## 2013-03-01 ENCOUNTER — Ambulatory Visit (INDEPENDENT_AMBULATORY_CARE_PROVIDER_SITE_OTHER): Payer: Medicare Other | Admitting: Cardiothoracic Surgery

## 2013-03-01 VITALS — BP 104/67 | HR 103 | Temp 97.7°F | Resp 16 | Ht 72.0 in | Wt 150.5 lb

## 2013-03-01 DIAGNOSIS — J90 Pleural effusion, not elsewhere classified: Secondary | ICD-10-CM

## 2013-03-01 NOTE — Progress Notes (Signed)
PCP is ROBERTS, Vernie Ammons, MD Referring Provider is Nyoka Cowden, MD  Chief Complaint  Patient presents with  . Pleural Effusion    loculated left...eval and treat.Marland KitchenMarland KitchenI/P CONSULT 01/31/13...last cxr 02/25/13    HPI: 78 year old African American male presents for evaluation of a chronic loculated left pleural effusion. The patient was admitted at [place] 2 weeks in November for a left lower lobe pneumonia with empyema treated with thoracentesis then chest tube drainage of fluid culture positive for Streptococcus. The patient was recommended left VATS and decortication but refused and was discharged to [facility]. He finished a course of oral Augmentin. He has had increased shortness of breath and is walking minimally with physical therapy. However his overall strength has improved he feels and  his oral intake is improved. Chest x-ray 2 days ago shows a large left pleural effusion. He now wishes to proceed with surgery because he cannot "go on like this for very much longer". Patient denies productive cough fever hemoptysis or left chest wall pain.  His past medical history is significant for heart failure with ejection fraction of 20% mild to moderate MR, chronic renal insufficiency with a creatinine of 2. 3. He is anemic with a hemoglobin 7.8. He has chronic atrial fibrillation but is not on anticoagulation. He states he has not had heart catheterization and is followed by Dr. Peter Swaziland   Past Medical History  Diagnosis Date  . Aortic insufficiency     a. Previously mod-severe;  b. 03/2012 Echo: Mild to Mod AI  . Atrial flutter     resolved  . Anemia   . PVCs (premature ventricular contractions)   . Chronic systolic CHF (congestive heart failure)     a.  03/2012 Echo: EF 15%, Mild to Mod AI, Mod MR  . Bilateral hydronephrosis     a. 03/2012 Renal u/s: Bilat Severe hydronephrosis, medical renal dzs.  . CKD (chronic kidney disease), stage III   . Non Hodgkin's lymphoma      resolved >10 yr ago  . Moderate mitral regurgitation     a.  03/2012 Echo: Mod MR  . BPH (benign prostatic hyperplasia)     a. noted on renal u/s 03/2012.  . Ischemic cardiomyopathy     a.  Lexiscan Myoview 04/24/12: Inferior apical MI, study not gated due to frequent PVCs, triplets => patient refuses cath  . Gout   . Cardiomyopathy, ischemic 04/24/2012  . Acute systolic CHF (congestive heart failure) 04/17/2012  . Atrial fibrillation 02/02/2013  . CAD (coronary artery disease) 04/24/2012  . Chronic systolic heart failure 04/24/2012  . Acute respiratory failure 01/31/2013  . CAP (community acquired pneumonia) 01/30/2013  . Pleural effusion, left 01/29/2013  . Empyema 01/30/2013  . Protein-calorie malnutrition, severe 01/31/2013    Past Surgical History  Procedure Laterality Date  . Lymph node dissection      Groin  . Inguinal hernia repair      Family History  Problem Relation Age of Onset  . Prostate cancer Father     Social History History  Substance Use Topics  . Smoking status: Never Smoker   . Smokeless tobacco: Never Used  . Alcohol Use: No    Current Outpatient Prescriptions  Medication Sig Dispense Refill  . docusate sodium (COLACE) 100 MG capsule Take 100 mg by mouth 2 (two) times daily.      . feeding supplement, ENSURE COMPLETE, (ENSURE COMPLETE) LIQD Take 237 mLs by mouth daily after supper.      Marland Kitchen  ferrous sulfate 325 (65 FE) MG tablet Take 325 mg by mouth 2 (two) times daily with a meal.      . furosemide (LASIX) 40 MG tablet Take 40 mg by mouth daily as needed for fluid (for weight gain of 3 pounds or more).       Marland Kitchen HYDROcodone-acetaminophen (NORCO/VICODIN) 5-325 MG per tablet Take 1 tablet by mouth every 4 hours as needed for mild pain *DO NOT EXCEED 4 GM OF TYLENOL IN 24 HOURS*; Take 2 tablets by mouth every 4 hours as needed for moderate to severe pain  180 tablet  0  . nitroGLYCERIN (NITROSTAT) 0.4 MG SL tablet Place 1 tablet (0.4 mg total) under the tongue  every 5 (five) minutes x 3 doses as needed for chest pain.  25 tablet  3  . saccharomyces boulardii (FLORASTOR) 250 MG capsule Take 250 mg by mouth 2 (two) times daily.      . tamsulosin (FLOMAX) 0.4 MG CAPS capsule Take 1 capsule (0.4 mg total) by mouth daily after supper.  30 capsule     No current facility-administered medications for this visit.    Allergies  Allergen Reactions  . Contrast Media [Iodinated Diagnostic Agents] Other (See Comments)    Patient does not want dye because it is hard on his kidneys  . Prednisone Rash  . Shellfish Allergy Itching and Rash    Crab meat    Review of Systems shortness of breath uses a wheelchair stable by lateral pedal edema no recent fever He has not had influenza vaccine  BP 104/67  Pulse 103  Temp(Src) 97.7 F (36.5 C) (Oral)  Resp 16  Ht 6' (1.829 m)  Wt 150 lb 8 oz (68.266 kg)  BMI 20.41 kg/m2  SpO2 94% Physical Exam Alert and comfortable in wheelchair accompanied by a brother Lungs with diminished breath sounds on left with tubular breath sounds at the left base Heart rate irregular-atrial fibrillation Bilateral ankle edema Neuro overall weak but no focal motor deficit Abdomen nontender without pulsatile mass  Diagnostic Tests: Chest x-ray ordered this week shows a large left pleural effusion, right lung is clear, cardiomegaly stable baseline  Impression: Large left loculated effusion. CT scan of chest reviewed during a previous hospitalization shows thick pleural peel. Placement of Pleurx catheter in this previously infected space would probably not provide long-term benefit. Left VATS with drainage and decortication of lung is his best long-term therapy for his current situation however presents risks due to his comorbid medical diseases including low ejection fraction heart failure and chronic renal insufficiency. He understands that he has a 5-10% risk of major morbidity with dialysis or prolonged entilator dependence or  mortality. He wishes to proceed. He wishes to have a few days available next week to set his affairs in order prior to surgery. We'll schedule surgery for December 18 at [facility] hospital.  Plan:

## 2013-03-04 ENCOUNTER — Encounter (HOSPITAL_COMMUNITY): Payer: Self-pay

## 2013-03-04 ENCOUNTER — Encounter (HOSPITAL_COMMUNITY)
Admission: RE | Admit: 2013-03-04 | Discharge: 2013-03-04 | Disposition: A | Discharge status: Home / Self Care | Payer: Medicare Other | Source: Ambulatory Visit | Attending: Cardiothoracic Surgery | Admitting: Cardiothoracic Surgery

## 2013-03-04 ENCOUNTER — Telehealth: Payer: Self-pay | Admitting: Internal Medicine

## 2013-03-04 VITALS — BP 110/65 | HR 101 | Temp 98.2°F | Resp 18 | Ht 71.0 in | Wt 150.5 lb

## 2013-03-04 DIAGNOSIS — Z01818 Encounter for other preprocedural examination: Secondary | ICD-10-CM | POA: Insufficient documentation

## 2013-03-04 DIAGNOSIS — J90 Pleural effusion, not elsewhere classified: Secondary | ICD-10-CM

## 2013-03-04 DIAGNOSIS — Z01812 Encounter for preprocedural laboratory examination: Secondary | ICD-10-CM | POA: Insufficient documentation

## 2013-03-04 DIAGNOSIS — Z01810 Encounter for preprocedural cardiovascular examination: Secondary | ICD-10-CM | POA: Insufficient documentation

## 2013-03-04 HISTORY — DX: Nonrheumatic aortic (valve) stenosis: I35.0

## 2013-03-04 HISTORY — DX: Unspecified abnormalities of gait and mobility: R26.9

## 2013-03-04 HISTORY — DX: Supraventricular tachycardia: I47.1

## 2013-03-04 HISTORY — DX: Nonrheumatic mitral (valve) insufficiency: I34.0

## 2013-03-04 HISTORY — DX: Supraventricular tachycardia, unspecified: I47.10

## 2013-03-04 HISTORY — DX: Shortness of breath: R06.02

## 2013-03-04 LAB — URINE MICROSCOPIC-ADD ON

## 2013-03-04 LAB — COMPREHENSIVE METABOLIC PANEL
ALT: 13 U/L (ref 0–53)
AST: 17 U/L (ref 0–37)
Albumin: 2.1 g/dL — ABNORMAL LOW (ref 3.5–5.2)
Alkaline Phosphatase: 97 U/L (ref 39–117)
BUN: 38 mg/dL — ABNORMAL HIGH (ref 6–23)
CO2: 18 mEq/L — ABNORMAL LOW (ref 19–32)
Calcium: 8.2 mg/dL — ABNORMAL LOW (ref 8.4–10.5)
Chloride: 112 mEq/L (ref 96–112)
Creatinine, Ser: 2.07 mg/dL — ABNORMAL HIGH (ref 0.50–1.35)
GFR calc Af Amer: 34 mL/min — ABNORMAL LOW (ref >90)
GFR calc non Af Amer: 29 mL/min — ABNORMAL LOW (ref >90)
Glucose, Bld: 83 mg/dL (ref 70–99)
Potassium: 5.4 mEq/L — ABNORMAL HIGH (ref 3.5–5.1)
Sodium: 140 mEq/L (ref 135–145)
Total Bilirubin: 0.2 mg/dL — ABNORMAL LOW (ref 0.3–1.2)
Total Protein: 7.1 g/dL (ref 6.0–8.3)

## 2013-03-04 LAB — URINALYSIS, ROUTINE W REFLEX MICROSCOPIC
Bilirubin Urine: NEGATIVE
Glucose, UA: NEGATIVE mg/dL
Hgb urine dipstick: NEGATIVE
Ketones, ur: NEGATIVE mg/dL
Nitrite: NEGATIVE
Protein, ur: NEGATIVE mg/dL
Specific Gravity, Urine: 1.015 (ref 1.005–1.030)
Urobilinogen, UA: 0.2 mg/dL (ref 0.0–1.0)
pH: 5 (ref 5.0–8.0)

## 2013-03-04 LAB — BLOOD GAS, ARTERIAL
Acid-base deficit: 5.5 mmol/L — ABNORMAL HIGH (ref 0.0–2.0)
Bicarbonate: 18.9 mEq/L — ABNORMAL LOW (ref 20.0–24.0)
Drawn by: 206361
FIO2: 0.21 %
O2 Saturation: 96.9 %
Patient temperature: 98.6
TCO2: 20 mmol/L (ref 0–100)
pCO2 arterial: 34.2 mmHg — ABNORMAL LOW (ref 35.0–45.0)
pH, Arterial: 7.362 (ref 7.350–7.450)
pO2, Arterial: 84.8 mmHg (ref 80.0–100.0)

## 2013-03-04 LAB — PROTIME-INR
INR: 1.23 (ref 0.00–1.49)
Prothrombin Time: 15.2 seconds (ref 11.6–15.2)

## 2013-03-04 LAB — CBC
HCT: 29.4 % — ABNORMAL LOW (ref 39.0–52.0)
Hemoglobin: 9.4 g/dL — ABNORMAL LOW (ref 13.0–17.0)
MCH: 28.4 pg (ref 26.0–34.0)
MCHC: 32 g/dL (ref 30.0–36.0)
MCV: 88.8 fL (ref 78.0–100.0)
Platelets: 211 10*3/uL (ref 150–400)
RBC: 3.31 MIL/uL — ABNORMAL LOW (ref 4.22–5.81)
RDW: 18.9 % — ABNORMAL HIGH (ref 11.5–15.5)
WBC: 5.5 10*3/uL (ref 4.0–10.5)

## 2013-03-04 LAB — SURGICAL PCR SCREEN
MRSA, PCR: NEGATIVE
Staphylococcus aureus: NEGATIVE

## 2013-03-04 LAB — APTT: aPTT: 32 seconds (ref 24–37)

## 2013-03-04 NOTE — Telephone Encounter (Signed)
Pt aware of recs. He reports I need to call this into [facility]. I called [phone] to speak with pt nurse to see where this needs to be called into or if order is needed. I had to leave a message to call back. Will await call back

## 2013-03-04 NOTE — Progress Notes (Signed)
Spoke with [facility] from Dr. Zenaida Niece Trigt's office to make MD aware of abnormal labs ( CBC, CMP, and UA).

## 2013-03-04 NOTE — Progress Notes (Signed)
Pt chart forwarded to [place], [place] ( anesthesia)  for review. Pt stated that he is to begin a new course of antibiotics today; pt not sure of name of drug however, pt was instructed to take antibiotic the morning of surgery with a small sip of water if prescribed to take in the AM.

## 2013-03-04 NOTE — Telephone Encounter (Signed)
Augmentin 875 mg take one pill twice daily  X 10 days - take at breakfast and supper with large glass of water.     If condition worsens go to [facility] ER (awaiting vats 12/18)

## 2013-03-04 NOTE — Progress Notes (Signed)
Anesthesia chart review:  Patient is a 78 year old male scheduled for left VATS, decortication on 03/07/13 by Dr. Donata Clay.  He was hospitalized in 01/2013 with a large loculated left pleural effusion (Streptococcus), CAP and this procedure was recommended at that time, but patient refused. He was treated conservatively with a CT and antibiotics. Cardiology was following at that time and was aware of plans for surgery.  They were following him for chronic systolic CHF and PAF.  He had previously refused cardiac cath and ICD on multiple occasions.  By notes, his consistent refusal of aggressive medical and surgical treatments have limited cardiology/CT surgeon to help him.  He was seen by Dr. Derenda Mis for a Goals of Care consult on 11/151/4.   He now has developed increased SOB despite conservative therapy and wishes to proceed with VATS. According to Dr. Zenaida Niece Trigt's 03/01/13 office note. "Left VATS with drainage and decortication of lung is his best long-term therapy for his current situation however presents risks due to his comorbid medical diseases including low ejection fraction heart failure and chronic renal insufficiency. He understands that he has a 5-10% risk of major morbidity with dialysis or prolonged entilator dependence or mortality. He wishes to proceed. He wishes to have a few days available next week to set his affairs in order prior to surgery. We'll schedule surgery for December 18 at [facility]." Of note, he was started on Augmentin 875 mg BID X 10 days today by Dr. Sherene Sires for increased SOB, productive cough.  Dr. Sherene Sires advised him to go the the ED if symptoms worsen since he is scheduled for VATS on 03/07/13.   History includes non-smoker, afib/flutter, PVC's, chronic systolic CHF, ischemic CM, moderate MR (mild MR by 01/2013 echo), AS is listed but not noted on 01/2013 echo--instead has moderate AI, CAD (presumed; high risk stress test; he has refused cardiac cath due to concerns of  contrast nephropathy), CKD stage III, bilateral hydronephrosis by renal ultrasound 03/2012, non-Hodgkin's lymphoma s/p chemotherapy 12 years ago, BPH, severe protein malnutrition, CAP/left empyema 01/2013.  PCP is listed as Dr. Vernie Ammons Roberts--although he has been at [facility] and under the care of Dr. Baltazar Najjar.  Pulmonologist is Dr.Douglas McQuaid (in-patient) and Dr. Sandrea Hughs (out-patient), ID Dr. Ninetta Lights.  Cardiologist is Dr. Peter Swaziland.  Echo on 02/01/13 showed: - Left ventricle: The cavity size was mildly dilated. Wallthickness was normal. The estimated ejection fraction was 20%. Diffuse hypokinesis. Doppler parameters are consistent with abnormal left ventricular relaxation (grade 1 diastolic dysfunction). - Aortic valve: Poorly visualized. There was no stenosis.Probably moderate eccentric aortic insufficiency. - Aorta: Aortic root dimension:49mm (ED). - Aortic root: The aortic root was severely dilated. - Mitral valve: Mildly to moderately calcified annulus. Normal thickness leaflets . Mild regurgitation. - Right ventricle: The cavity size was normal. Systolic function was normal. - Right atrium: The atrium was mildly dilated. - Tricuspid valve: Peak RV-RA gradient:32mm Hg (S). - Pulmonary arteries: PA peak pressure: 38mm Hg (S). - Inferior vena cava: The vessel was normal in size; the respirophasic diameter changes were in the normal range (= 50%); findings are consistent with normal central venous pressure. - Pericardium, extracardiac: There was a pleural effusion. Small posterior pericardial effusion.  On 04/23/12, he had a high risk stress nuclear study. There is evidence of a previous inferior apical MI. The LV is markedly dilated. LV Ejection Fraction: Study not gated because of significant arrhythmia. He has refused cardiac cath and ICD.  CXR on 03/04/13 showed: The cardiac shadow is stable. A large left-sided pleural effusion is again seen. Some infiltrated  density is noted in the aerated left lung stable from the prior study. The right lung is clear. Scoliosis of the mid thoracic spine is noted. No other focal abnormality is seen.  EKG on 03/04/13 showed SR with occasional PVCs, non-specific T wave abnormality.  Preoperative labs noted.  BUN/Cr 38/2.07 which appears stable since at least 04/13/12. H/H 9.4/29.4, up from 02/05/13.  T&S already done.  I reviewed above with anesthesiologist Dr. Katrinka Blazing.  Patient with significant cardiac history, but has refused cath and ICD. Dr. Swaziland saw him last month and was aware of plans for VATS at that time, which patient later refused.  Respiratory symptoms becoming more acute now and now he has agreed to VATS/decortication.  Notes indicate that Dr. Donata Clay has reviewed increased risks with patient.  If no acute changes then anticipate that he can proceed as planned, but with increased risk due to multiple co-morbidities.  Velna Ochs [facility] Stay [facility]/Anesthesiology Phone [phone] 03/05/2013 1:02 PM

## 2013-03-04 NOTE — Telephone Encounter (Signed)
Called spoke with patient who c/o increased SOB, difficulty sleeping d/t dyspnea, chest tightness, occasional cough that produces clear- to yellow mucus.  Dyspnea onset this morning around 2am.  Pt is requesting an antibiotic be sent to [facility].  Last ov 12.12.14 w/ MW Allergies  Allergen Reactions  . Contrast Media [Iodinated Diagnostic Agents] Other (See Comments)    Patient does not want dye because it is hard on his kidneys  . Prednisone Rash  . Shellfish Allergy Itching and Rash    Crab meat   Dr Sherene Sires please advise, thank you.

## 2013-03-04 NOTE — Pre-Procedure Instructions (Signed)
[name]  03/04/2013   Your procedure is scheduled on:  Thursday, March 07, 2013  Report to [facility] Short Stay (use Main Entrance "A'') at 5:30 AM.  Call this number if you have problems the morning of surgery: [phone]   Remember:   Do not eat food or drink liquids after midnight.   Take these medicines the morning of surgery with A SIP OF WATER: if needed: HYDROcodone-acetaminophen (NORCO/VICODIN) 5-325 MG per tablet  for pain nitroGLYCERIN (NITROSTAT) 0.4 MG SL tablet for chest pain Stop taking Aspirin, vitamins and herbal medications. Do not take any NSAIDs ie: Ibuprofen, Advil, Naproxen or any medication containing Aspirin.  Do not wear jewelry, make-up or nail polish.  Do not wear lotions, powders, or perfumes. You may  NOTwear deodorant.  Do not shave 48 hours prior to surgery. Men may shave face and neck.  Do not bring valuables to the hospital.  [facility] is not responsible for any belongings or valuables.               Contacts, dentures or bridgework may not be worn into surgery.  Leave suitcase in the car. After surgery it may be brought to your room.  For patients admitted to the hospital, discharge time is determined by your treatment team.               Patients discharged the day of surgery will not be allowed to drive home.  Name and phone number of your driver:   Special Instructions: Shower using CHG 2 nights before surgery and the night before surgery.  If you shower the day of surgery use CHG.  Use special wash - you have one bottle of CHG for all showers.  You should use approximately 1/3 of the bottle for each shower.   Please read over the following fact sheets that you were given: Pain Booklet, Coughing and Deep Breathing, Blood Transfusion Information, MRSA Information and Surgical Site Infection Prevention

## 2013-03-04 NOTE — Telephone Encounter (Signed)
Spoke with the pt's nurse and advised on rx  Rx was faxed to her Victorino Dike) at [phone] Nothing further needed

## 2013-03-05 LAB — URINE CULTURE
Colony Count: NO GROWTH
Culture: NO GROWTH

## 2013-03-06 MED ORDER — DEXTROSE 5 % IV SOLN
1.5000 g | INTRAVENOUS | Status: AC
  Administered 2013-03-07: 1.5 g via INTRAVENOUS
  Filled 2013-03-06: qty 1.5

## 2013-03-07 ENCOUNTER — Inpatient Hospital Stay (HOSPITAL_COMMUNITY)
Admission: RE | Admit: 2013-03-07 | Discharge: 2013-03-13 | DRG: 163 | Disposition: A | Discharge status: Skilled Nursing Facility | Payer: Medicare Other | Source: Ambulatory Visit | Attending: Pulmonary Disease | Admitting: Pulmonary Disease

## 2013-03-07 ENCOUNTER — Encounter (HOSPITAL_COMMUNITY)
Admission: RE | Disposition: A | Discharge status: Skilled Nursing Facility | Payer: Self-pay | Source: Ambulatory Visit | Attending: Pulmonary Disease

## 2013-03-07 ENCOUNTER — Inpatient Hospital Stay (HOSPITAL_COMMUNITY): Payer: Medicare Other

## 2013-03-07 ENCOUNTER — Inpatient Hospital Stay (HOSPITAL_COMMUNITY): Payer: Medicare Other | Admitting: Certified Registered Nurse Anesthetist

## 2013-03-07 ENCOUNTER — Encounter (HOSPITAL_COMMUNITY): Payer: Self-pay | Admitting: *Deleted

## 2013-03-07 ENCOUNTER — Encounter (HOSPITAL_COMMUNITY): Payer: Medicare Other | Admitting: Vascular Surgery

## 2013-03-07 DIAGNOSIS — J189 Pneumonia, unspecified organism: Secondary | ICD-10-CM

## 2013-03-07 DIAGNOSIS — J9819 Other pulmonary collapse: Secondary | ICD-10-CM

## 2013-03-07 DIAGNOSIS — J90 Pleural effusion, not elsewhere classified: Secondary | ICD-10-CM

## 2013-03-07 DIAGNOSIS — T41205A Adverse effect of unspecified general anesthetics, initial encounter: Secondary | ICD-10-CM | POA: Diagnosis not present

## 2013-03-07 DIAGNOSIS — I82629 Acute embolism and thrombosis of deep veins of unspecified upper extremity: Secondary | ICD-10-CM | POA: Diagnosis present

## 2013-03-07 DIAGNOSIS — N1330 Unspecified hydronephrosis: Secondary | ICD-10-CM | POA: Diagnosis present

## 2013-03-07 DIAGNOSIS — I255 Ischemic cardiomyopathy: Secondary | ICD-10-CM

## 2013-03-07 DIAGNOSIS — E43 Unspecified severe protein-calorie malnutrition: Secondary | ICD-10-CM

## 2013-03-07 DIAGNOSIS — E46 Unspecified protein-calorie malnutrition: Secondary | ICD-10-CM | POA: Diagnosis not present

## 2013-03-07 DIAGNOSIS — J869 Pyothorax without fistula: Secondary | ICD-10-CM | POA: Diagnosis present

## 2013-03-07 DIAGNOSIS — N400 Enlarged prostate without lower urinary tract symptoms: Secondary | ICD-10-CM | POA: Diagnosis present

## 2013-03-07 DIAGNOSIS — I959 Hypotension, unspecified: Secondary | ICD-10-CM | POA: Diagnosis not present

## 2013-03-07 DIAGNOSIS — I509 Heart failure, unspecified: Secondary | ICD-10-CM | POA: Diagnosis present

## 2013-03-07 DIAGNOSIS — J9600 Acute respiratory failure, unspecified whether with hypoxia or hypercapnia: Secondary | ICD-10-CM | POA: Diagnosis not present

## 2013-03-07 DIAGNOSIS — I080 Rheumatic disorders of both mitral and aortic valves: Secondary | ICD-10-CM | POA: Diagnosis present

## 2013-03-07 DIAGNOSIS — I5021 Acute systolic (congestive) heart failure: Secondary | ICD-10-CM

## 2013-03-07 DIAGNOSIS — I5022 Chronic systolic (congestive) heart failure: Secondary | ICD-10-CM | POA: Diagnosis present

## 2013-03-07 DIAGNOSIS — I4891 Unspecified atrial fibrillation: Secondary | ICD-10-CM | POA: Diagnosis present

## 2013-03-07 DIAGNOSIS — N183 Chronic kidney disease, stage 3 unspecified: Secondary | ICD-10-CM | POA: Diagnosis present

## 2013-03-07 DIAGNOSIS — C8589 Other specified types of non-Hodgkin lymphoma, extranodal and solid organ sites: Secondary | ICD-10-CM | POA: Diagnosis present

## 2013-03-07 DIAGNOSIS — I2589 Other forms of chronic ischemic heart disease: Secondary | ICD-10-CM

## 2013-03-07 DIAGNOSIS — I2510 Atherosclerotic heart disease of native coronary artery without angina pectoris: Secondary | ICD-10-CM | POA: Diagnosis present

## 2013-03-07 DIAGNOSIS — M109 Gout, unspecified: Secondary | ICD-10-CM | POA: Diagnosis present

## 2013-03-07 DIAGNOSIS — I82A12 Acute embolism and thrombosis of left axillary vein: Secondary | ICD-10-CM | POA: Diagnosis not present

## 2013-03-07 DIAGNOSIS — D649 Anemia, unspecified: Secondary | ICD-10-CM | POA: Diagnosis present

## 2013-03-07 HISTORY — PX: TALC PLEURODESIS: SHX2506

## 2013-03-07 HISTORY — PX: CHEST TUBE INSERTION: SHX231

## 2013-03-07 LAB — POCT I-STAT 3, ART BLOOD GAS (G3+)
Acid-base deficit: 4 mmol/L — ABNORMAL HIGH (ref 0.0–2.0)
Acid-base deficit: 4 mmol/L — ABNORMAL HIGH (ref 0.0–2.0)
Acid-base deficit: 5 mmol/L — ABNORMAL HIGH (ref 0.0–2.0)
Bicarbonate: 20.5 mEq/L (ref 20.0–24.0)
Bicarbonate: 22 mEq/L (ref 20.0–24.0)
Bicarbonate: 21.6 mEq/L (ref 20.0–24.0)
O2 Saturation: 98 %
O2 Saturation: 98 %
O2 Saturation: 94 %
Patient temperature: 97.3
Patient temperature: 97.5
TCO2: 22 mmol/L (ref 0–100)
TCO2: 23 mmol/L (ref 0–100)
TCO2: 23 mmol/L (ref 0–100)
pCO2 arterial: 36.5 mmHg (ref 35.0–45.0)
pCO2 arterial: 43.5 mmHg (ref 35.0–45.0)
pCO2 arterial: 44.7 mmHg (ref 35.0–45.0)
pH, Arterial: 7.358 (ref 7.350–7.450)
pH, Arterial: 7.309 — ABNORMAL LOW (ref 7.350–7.450)
pH, Arterial: 7.288 — ABNORMAL LOW (ref 7.350–7.450)
pO2, Arterial: 117 mmHg — ABNORMAL HIGH (ref 80.0–100.0)
pO2, Arterial: 104 mmHg — ABNORMAL HIGH (ref 80.0–100.0)
pO2, Arterial: 79 mmHg — ABNORMAL LOW (ref 80.0–100.0)

## 2013-03-07 LAB — CBC
HCT: 28.6 % — ABNORMAL LOW (ref 39.0–52.0)
Hemoglobin: 9 g/dL — ABNORMAL LOW (ref 13.0–17.0)
MCH: 28.2 pg (ref 26.0–34.0)
MCHC: 31.5 g/dL (ref 30.0–36.0)
MCV: 89.7 fL (ref 78.0–100.0)
Platelets: 174 10*3/uL (ref 150–400)
RBC: 3.19 MIL/uL — ABNORMAL LOW (ref 4.22–5.81)
RDW: 19.2 % — ABNORMAL HIGH (ref 11.5–15.5)
WBC: 5.8 10*3/uL (ref 4.0–10.5)

## 2013-03-07 LAB — POCT I-STAT 7, (LYTES, BLD GAS, ICA,H+H)
Acid-base deficit: 6 mmol/L — ABNORMAL HIGH (ref 0.0–2.0)
Bicarbonate: 22.5 mEq/L (ref 20.0–24.0)
Calcium, Ion: 1.22 mmol/L (ref 1.13–1.30)
HCT: 29 % — ABNORMAL LOW (ref 39.0–52.0)
Hemoglobin: 9.9 g/dL — ABNORMAL LOW (ref 13.0–17.0)
O2 Saturation: 100 %
Potassium: 5.7 mEq/L — ABNORMAL HIGH (ref 3.5–5.1)
Sodium: 143 mEq/L (ref 135–145)
TCO2: 24 mmol/L (ref 0–100)
pCO2 arterial: 60.9 mmHg (ref 35.0–45.0)
pH, Arterial: 7.176 — CL (ref 7.350–7.450)
pO2, Arterial: 312 mmHg — ABNORMAL HIGH (ref 80.0–100.0)

## 2013-03-07 LAB — POCT I-STAT, CHEM 8
BUN: 35 mg/dL — ABNORMAL HIGH (ref 6–23)
Calcium, Ion: 1.2 mmol/L (ref 1.13–1.30)
Chloride: 113 mEq/L — ABNORMAL HIGH (ref 96–112)
Creatinine, Ser: 2.1 mg/dL — ABNORMAL HIGH (ref 0.50–1.35)
Glucose, Bld: 129 mg/dL — ABNORMAL HIGH (ref 70–99)
HCT: 33 % — ABNORMAL LOW (ref 39.0–52.0)
Hemoglobin: 11.2 g/dL — ABNORMAL LOW (ref 13.0–17.0)
Potassium: 5.1 mEq/L (ref 3.5–5.1)
Sodium: 144 mEq/L (ref 135–145)
TCO2: 19 mmol/L (ref 0–100)

## 2013-03-07 LAB — BASIC METABOLIC PANEL
BUN: 37 mg/dL — ABNORMAL HIGH (ref 6–23)
Calcium: 8.3 mg/dL — ABNORMAL LOW (ref 8.4–10.5)
Creatinine, Ser: 2.1 mg/dL — ABNORMAL HIGH (ref 0.50–1.35)
GFR calc Af Amer: 34 mL/min — ABNORMAL LOW (ref >90)
GFR calc non Af Amer: 29 mL/min — ABNORMAL LOW (ref >90)
Glucose, Bld: 125 mg/dL — ABNORMAL HIGH (ref 70–99)
Sodium: 140 mEq/L (ref 135–145)

## 2013-03-07 LAB — PREPARE RBC (CROSSMATCH)

## 2013-03-07 LAB — GLUCOSE, CAPILLARY
Glucose-Capillary: 97 mg/dL (ref 70–99)
Glucose-Capillary: 98 mg/dL (ref 70–99)
Glucose-Capillary: 107 mg/dL — ABNORMAL HIGH (ref 70–99)

## 2013-03-07 SURGERY — CHEST TUBE INSERTION
Anesthesia: General | Site: Chest | Laterality: Left

## 2013-03-07 MED ORDER — SUCCINYLCHOLINE CHLORIDE 20 MG/ML IJ SOLN
INTRAMUSCULAR | Status: DC | PRN
  Administered 2013-03-07: 100 mg via INTRAVENOUS

## 2013-03-07 MED ORDER — DEXTROSE-NACL 5-0.45 % IV SOLN
INTRAVENOUS | Status: DC
  Administered 2013-03-07 – 2013-03-08 (×2): via INTRAVENOUS

## 2013-03-07 MED ORDER — OXYCODONE HCL 5 MG PO TABS: 5.0000 mg | ORAL_TABLET | ORAL | Status: DC | PRN

## 2013-03-07 MED ORDER — LACTATED RINGERS IV SOLN
INTRAVENOUS | Status: DC | PRN
  Administered 2013-03-07: 08:00:00 via INTRAVENOUS

## 2013-03-07 MED ORDER — FENTANYL CITRATE 0.05 MG/ML IJ SOLN
INTRAMUSCULAR | Status: DC | PRN
  Administered 2013-03-07: 75 ug via INTRAVENOUS
  Administered 2013-03-07: 25 ug via INTRAVENOUS

## 2013-03-07 MED ORDER — HYDROCORTISONE SOD SUCCINATE 100 MG IJ SOLR
INTRAMUSCULAR | Status: DC | PRN
  Administered 2013-03-07: 100 mg via INTRAVENOUS

## 2013-03-07 MED ORDER — LACTATED RINGERS IV SOLN
INTRAVENOUS | Status: DC | PRN
  Administered 2013-03-07: 07:00:00 via INTRAVENOUS

## 2013-03-07 MED ORDER — PANTOPRAZOLE SODIUM 40 MG IV SOLR
40.0000 mg | INTRAVENOUS | Status: DC
  Administered 2013-03-07: 40 mg via INTRAVENOUS
  Filled 2013-03-07 (×3): qty 40

## 2013-03-07 MED ORDER — POTASSIUM CHLORIDE 10 MEQ/50ML IV SOLN
10.0000 meq | Freq: Every day | INTRAVENOUS | Status: DC | PRN
  Filled 2013-03-07: qty 50

## 2013-03-07 MED ORDER — BISACODYL 5 MG PO TBEC
10.0000 mg | DELAYED_RELEASE_TABLET | Freq: Every day | ORAL | Status: DC
  Administered 2013-03-08: 10 mg via ORAL
  Filled 2013-03-07 (×4): qty 2

## 2013-03-07 MED ORDER — VANCOMYCIN HCL IN DEXTROSE 1-5 GM/200ML-% IV SOLN
1000.0000 mg | INTRAVENOUS | Status: AC
  Filled 2013-03-07: qty 200

## 2013-03-07 MED ORDER — ROCURONIUM BROMIDE 100 MG/10ML IV SOLN
INTRAVENOUS | Status: DC | PRN
  Administered 2013-03-07: 40 mg via INTRAVENOUS
  Administered 2013-03-07: 10 mg via INTRAVENOUS
  Administered 2013-03-07: 20 mg via INTRAVENOUS

## 2013-03-07 MED ORDER — PHENYLEPHRINE HCL 10 MG/ML IJ SOLN
10.0000 mg | INTRAVENOUS | Status: DC | PRN
  Administered 2013-03-07: 120 ug/min via INTRAVENOUS

## 2013-03-07 MED ORDER — ENOXAPARIN SODIUM 30 MG/0.3ML ~~LOC~~ SOLN
30.0000 mg | SUBCUTANEOUS | Status: DC
  Administered 2013-03-07 – 2013-03-11 (×5): 30 mg via SUBCUTANEOUS
  Filled 2013-03-07 (×6): qty 0.3

## 2013-03-07 MED ORDER — FENTANYL CITRATE 0.05 MG/ML IJ SOLN
25.0000 ug | INTRAMUSCULAR | Status: DC | PRN
  Administered 2013-03-07 – 2013-03-10 (×9): 25 ug via INTRAVENOUS
  Filled 2013-03-07 (×6): qty 2

## 2013-03-07 MED ORDER — ALBUMIN HUMAN 5 % IV SOLN
INTRAVENOUS | Status: DC | PRN
  Administered 2013-03-07 (×2): via INTRAVENOUS

## 2013-03-07 MED ORDER — OXYCODONE-ACETAMINOPHEN 5-325 MG PO TABS: 1.0000 | ORAL_TABLET | ORAL | Status: DC | PRN

## 2013-03-07 MED ORDER — PHENYLEPHRINE HCL 10 MG/ML IJ SOLN
0.2000 mg | Freq: Once | INTRAMUSCULAR | Status: DC
  Filled 2013-03-07: qty 0.02

## 2013-03-07 MED ORDER — PIPERACILLIN-TAZOBACTAM 3.375 G IVPB
3.3750 g | Freq: Three times a day (TID) | INTRAVENOUS | Status: DC
  Filled 2013-03-07 (×3): qty 50

## 2013-03-07 MED ORDER — TALC 5 G PL SUSR
INTRAPLEURAL | Status: AC
  Filled 2013-03-07: qty 10

## 2013-03-07 MED ORDER — PROPOFOL 10 MG/ML IV BOLUS
INTRAVENOUS | Status: DC | PRN
  Administered 2013-03-07: 100 mg via INTRAVENOUS

## 2013-03-07 MED ORDER — TALC 5 G PL SUSR
INTRAPLEURAL | Status: DC | PRN
  Administered 2013-03-07: 5 g via INTRAPLEURAL

## 2013-03-07 MED ORDER — MIDAZOLAM HCL 5 MG/5ML IJ SOLN
INTRAMUSCULAR | Status: DC | PRN
  Administered 2013-03-07: 1.5 mg via INTRAVENOUS
  Administered 2013-03-07: 0.5 mg via INTRAVENOUS

## 2013-03-07 MED ORDER — INSULIN ASPART 100 UNIT/ML ~~LOC~~ SOLN
0.0000 [IU] | SUBCUTANEOUS | Status: DC
  Administered 2013-03-07: 2 [IU] via SUBCUTANEOUS

## 2013-03-07 MED ORDER — LIDOCAINE HCL (PF) 1 % IJ SOLN
INTRAMUSCULAR | Status: AC
  Filled 2013-03-07: qty 30

## 2013-03-07 MED ORDER — ACETAMINOPHEN 160 MG/5ML PO SOLN: 1000.0000 mg | Freq: Four times a day (QID) | ORAL | Status: AC

## 2013-03-07 MED ORDER — ONDANSETRON HCL 4 MG/2ML IJ SOLN: 4.0000 mg | Freq: Four times a day (QID) | INTRAMUSCULAR | Status: DC | PRN

## 2013-03-07 MED ORDER — LIDOCAINE HCL 1 % IJ SOLN
INTRAMUSCULAR | Status: DC | PRN
  Administered 2013-03-07: 8 mL

## 2013-03-07 MED ORDER — PIPERACILLIN-TAZOBACTAM 3.375 G IVPB
3.3750 g | Freq: Three times a day (TID) | INTRAVENOUS | Status: DC
  Administered 2013-03-07 – 2013-03-13 (×18): 3.375 g via INTRAVENOUS
  Filled 2013-03-07 (×21): qty 50

## 2013-03-07 MED ORDER — ACETAMINOPHEN 500 MG PO TABS
1000.0000 mg | ORAL_TABLET | Freq: Four times a day (QID) | ORAL | Status: AC
  Administered 2013-03-08: 1000 mg via ORAL
  Filled 2013-03-07: qty 2

## 2013-03-07 MED ORDER — PHENYLEPHRINE HCL 10 MG/ML IJ SOLN
INTRAMUSCULAR | Status: DC | PRN
  Administered 2013-03-07 (×9): 80 ug via INTRAVENOUS

## 2013-03-07 MED ORDER — 0.9 % SODIUM CHLORIDE (POUR BTL) OPTIME
TOPICAL | Status: DC | PRN
  Administered 2013-03-07: 1000 mL

## 2013-03-07 MED ORDER — LEVALBUTEROL HCL 0.63 MG/3ML IN NEBU
0.6300 mg | INHALATION_SOLUTION | Freq: Four times a day (QID) | RESPIRATORY_TRACT | Status: DC
  Administered 2013-03-07 – 2013-03-08 (×5): 0.63 mg via RESPIRATORY_TRACT
  Filled 2013-03-07 (×8): qty 3

## 2013-03-07 MED ORDER — LIDOCAINE HCL (CARDIAC) 20 MG/ML IV SOLN
INTRAVENOUS | Status: DC | PRN
  Administered 2013-03-07: 80 mg via INTRAVENOUS

## 2013-03-07 MED ORDER — SODIUM BICARBONATE 8.4 % IV SOLN
INTRAVENOUS | Status: DC | PRN
  Administered 2013-03-07: 50 meq via INTRAVENOUS

## 2013-03-07 SURGICAL SUPPLY — 59 items
BAG DECANTER FOR FLEXI CONT (MISCELLANEOUS) IMPLANT
BLADE SURG 11 STRL SS (BLADE) ×3 IMPLANT
CANISTER SUCTION 2500CC (MISCELLANEOUS) ×3 IMPLANT
CATH KIT ON Q 5IN SLV (PAIN MANAGEMENT) IMPLANT
CATH ROBINSON RED A/P 22FR (CATHETERS) ×2 IMPLANT
CATH THORACIC 28FR (CATHETERS) ×2 IMPLANT
CATH THORACIC 36FR (CATHETERS) IMPLANT
CATH THORACIC 36FR RT ANG (CATHETERS) IMPLANT
CATH TROCAR 32FR (CATHETERS) ×4 IMPLANT
CLIP TI MEDIUM 6 (CLIP) ×2 IMPLANT
CONT SPEC 4OZ CLIKSEAL STRL BL (MISCELLANEOUS) ×6 IMPLANT
COVER SURGICAL LIGHT HANDLE (MISCELLANEOUS) ×6 IMPLANT
DRAPE LAPAROSCOPIC ABDOMINAL (DRAPES) ×3 IMPLANT
DRAPE WARM FLUID 44X44 (DRAPE) ×3 IMPLANT
ELECT BLADE 4.0 EZ CLEAN MEGAD (MISCELLANEOUS) ×6
ELECT REM PT RETURN 9FT ADLT (ELECTROSURGICAL) ×3
ELECTRODE BLDE 4.0 EZ CLN MEGD (MISCELLANEOUS) ×1 IMPLANT
ELECTRODE REM PT RTRN 9FT ADLT (ELECTROSURGICAL) ×1 IMPLANT
GLOVE ORTHO TXT STRL SZ7.5 (GLOVE) ×6 IMPLANT
GOWN STRL NON-REIN LRG LVL3 (GOWN DISPOSABLE) ×9 IMPLANT
KIT BASIN OR (CUSTOM PROCEDURE TRAY) ×3 IMPLANT
KIT ROOM TURNOVER OR (KITS) ×3 IMPLANT
KIT SUCTION CATH 14FR (SUCTIONS) ×3 IMPLANT
NS IRRIG 1000ML POUR BTL (IV SOLUTION) ×6 IMPLANT
PACK CHEST (CUSTOM PROCEDURE TRAY) ×3 IMPLANT
PAD ARMBOARD 7.5X6 YLW CONV (MISCELLANEOUS) ×6 IMPLANT
SEALANT SURG COSEAL 4ML (VASCULAR PRODUCTS) IMPLANT
SOLUTION ANTI FOG 6CC (MISCELLANEOUS) ×3 IMPLANT
SPONGE GAUZE 4X4 12PLY (GAUZE/BANDAGES/DRESSINGS) ×3 IMPLANT
SPONGE TONSIL 1.25 RF SGL STRG (GAUZE/BANDAGES/DRESSINGS) ×3 IMPLANT
SUT CHROMIC 3 0 SH 27 (SUTURE) IMPLANT
SUT ETHILON 3 0 PS 1 (SUTURE) IMPLANT
SUT PROLENE 3 0 SH DA (SUTURE) IMPLANT
SUT PROLENE 4 0 RB 1 (SUTURE)
SUT PROLENE 4-0 RB1 .5 CRCL 36 (SUTURE) IMPLANT
SUT PROLENE 6 0 C 1 30 (SUTURE) IMPLANT
SUT SILK  1 MH (SUTURE) ×8
SUT SILK 1 MH (SUTURE) ×2 IMPLANT
SUT SILK 2 0SH CR/8 30 (SUTURE) IMPLANT
SUT SILK 3 0SH CR/8 30 (SUTURE) IMPLANT
SUT VIC AB 1 CTX 18 (SUTURE) ×3 IMPLANT
SUT VIC AB 2 TP1 27 (SUTURE) IMPLANT
SUT VIC AB 2-0 CT2 18 VCP726D (SUTURE) IMPLANT
SUT VIC AB 2-0 CTX 36 (SUTURE) ×5 IMPLANT
SUT VIC AB 3-0 SH 8-18 (SUTURE) ×2 IMPLANT
SUT VIC AB 3-0 X1 27 (SUTURE) ×3 IMPLANT
SUT VICRYL 0 UR6 27IN ABS (SUTURE) IMPLANT
SUT VICRYL 2 TP 1 (SUTURE) IMPLANT
SWAB COLLECTION DEVICE MRSA (MISCELLANEOUS) ×2 IMPLANT
SYR BULB IRRIGATION 50ML (SYRINGE) ×2 IMPLANT
SYSTEM SAHARA CHEST DRAIN ATS (WOUND CARE) ×3 IMPLANT
TAPE CLOTH SURG 4X10 WHT LF (GAUZE/BANDAGES/DRESSINGS) ×2 IMPLANT
TIP APPLICATOR SPRAY EXTEND 16 (VASCULAR PRODUCTS) IMPLANT
TOWEL OR 17X24 6PK STRL BLUE (TOWEL DISPOSABLE) ×3 IMPLANT
TOWEL OR 17X26 10 PK STRL BLUE (TOWEL DISPOSABLE) ×6 IMPLANT
TRAP SPECIMEN MUCOUS 40CC (MISCELLANEOUS) ×3 IMPLANT
TRAY FOLEY CATH 16FRSI W/METER (SET/KITS/TRAYS/PACK) ×3 IMPLANT
TUBE ANAEROBIC SPECIMEN COL (MISCELLANEOUS) ×2 IMPLANT
WATER STERILE IRR 1000ML POUR (IV SOLUTION) ×6 IMPLANT

## 2013-03-07 NOTE — H&P (Signed)
Name: [name] MRN: [id number] DOB: [birth year]    ADMISSION DATE:  03/07/2013 CONSULTATION DATE:  03/07/13  REFERRING MD :  Dr. Morton Peters PRIMARY SERVICE: TCTS  CHIEF COMPLAINT:  Post-Op Respiratory Failure  BRIEF PATIENT DESCRIPTION: 78 y/o M admitted 12/18 for planned VATS for loculated L pleural effusion.  SIGNIFICANT EVENTS / STUDIES:  12/18 - Admitted for VATS for L loculated effusion.  Unstable in OR, unable to complete surgical procedure per report  LINES / TUBES: OETT 12/18>>> R IJ TLC 12/18>>>  CULTURES: Pleural Fluid 12/18>>>  ANTIBIOTICS: Vanco 12/18>>> Zosyn 12/18>>>  HISTORY OF PRESENT ILLNESS:  78 y/o M, SNF resident with PMH of CAD, CHF/ICM w EF of 20%, MV Regurg, AV stenosis CKD (baseline 2.3), Non-Hodgkin's Lymphoma (resolved) and anemia who has been followed for L effusion / empyema since Oct 2014.  Post hospitalization, he was discharged to SNF. Previous fluid grew streptococcus and he was recommended to have VATS but refused.  He completed an oral course of Augmentin.  He was seen 12/08 by Dr. Sherene [name] and was again recommended for VATS for complicated effusion.  He continued to have increase in effusion by CXR and worsening SOB.  Patient was evaluated by Dr. Donata [name] on 12/12 for chronic loculated left pleural effusion.  Scheduled for VATS 12/18.  Reportedly, patient became to unstable to complete procedure in OR and was returned to ICU.    PAST MEDICAL HISTORY :  Past Medical History  Diagnosis Date  . Aortic insufficiency     a. Previously mod-severe;  b. 03/2012 Echo: Mild to Mod AI  . Atrial flutter     resolved  . Anemia   . PVCs (premature ventricular contractions)   . Chronic systolic CHF (congestive heart failure)     a.  03/2012 Echo: EF 15%, Mild to Mod AI, Mod MR  . Bilateral hydronephrosis     a. 03/2012 Renal u/s: Bilat Severe hydronephrosis, medical renal dzs.  . CKD (chronic kidney disease), stage III   . Non Hodgkin's lymphoma      resolved >10 yr ago  . Moderate mitral regurgitation     a.  03/2012 Echo: Mod MR  . BPH (benign prostatic hyperplasia)     a. noted on renal u/s 03/2012.  . Ischemic cardiomyopathy     a.  Lexiscan Myoview 04/24/12: Inferior apical MI, study not gated due to frequent PVCs, triplets => patient refuses cath  . Gout   . Cardiomyopathy, ischemic 04/24/2012  . Acute systolic CHF (congestive heart failure) 04/17/2012  . Atrial fibrillation 02/02/2013  . CAD (coronary artery disease) 04/24/2012  . Chronic systolic heart failure 04/24/2012  . Acute respiratory failure 01/31/2013  . CAP (community acquired pneumonia) 01/30/2013  . Pleural effusion, left 01/29/2013  . Empyema 01/30/2013  . Protein-calorie malnutrition, severe 01/31/2013  . Gait abnormality     Hx: of  . Mitral valve insufficiency     Hx: of   . Aortic valve stenosis     Hx: of  . Paroxysmal supraventricular tachycardia     Hx: of  . Shortness of breath    Past Surgical History  Procedure Laterality Date  . Lymph node dissection      Groin  . Inguinal hernia repair    . Colonoscopy w/ biopsies and polypectomy      Hx: of   . Tonsillectomy     Prior to Admission medications   Medication Sig Start Date End Date Taking? Authorizing Provider  docusate sodium (COLACE) 100 MG capsule Take 100 mg by mouth 2 (two) times daily.   Yes Historical Provider, MD  feeding supplement, ENSURE COMPLETE, (ENSURE COMPLETE) LIQD Take 237 mLs by mouth daily after supper. 02/08/13  Yes Leroy Sea, MD  ferrous sulfate 325 (65 FE) MG tablet Take 325 mg by mouth 2 (two) times daily with a meal.   Yes Historical Provider, MD  furosemide (LASIX) 40 MG tablet Take 40 mg by mouth daily as needed for fluid (for weight gain of 3 pounds or more).    Yes Historical Provider, MD  HYDROcodone-acetaminophen (NORCO/VICODIN) 5-325 MG per tablet Take 1 tablet by mouth every 4 hours as needed for mild pain *DO NOT EXCEED 4 GM OF TYLENOL IN 24 HOURS*; Take 2  tablets by mouth every 4 hours as needed for moderate to severe pain 02/08/13  Yes Man X Mast, NP  saccharomyces boulardii (FLORASTOR) 250 MG capsule Take 250 mg by mouth 2 (two) times daily.   Yes Historical Provider, MD  tamsulosin (FLOMAX) 0.4 MG CAPS capsule Take 1 capsule (0.4 mg total) by mouth daily after supper. 02/08/13  Yes Leroy Sea, MD  nitroGLYCERIN (NITROSTAT) 0.4 MG SL tablet Place 1 tablet (0.4 mg total) under the tongue every 5 (five) minutes x 3 doses as needed for chest pain. 06/22/12   [name] M Swaziland, MD   Allergies  Allergen Reactions  . Contrast Media [Iodinated Diagnostic Agents] Other (See Comments)    Patient does not want dye because it is hard on his kidneys  . Prednisone Rash  . Shellfish Allergy Itching and Rash    Crab meat    FAMILY HISTORY:  Family History  Problem Relation Age of Onset  . Prostate cancer Father    SOCIAL HISTORY:  reports that he has never smoked. He has never used smokeless tobacco. He reports that he does not drink alcohol or use illicit drugs.  REVIEW OF SYSTEMS:  Unable to complete as pt is altered on vent.   SUBJECTIVE:   VITAL SIGNS: Temp:  [97.5 F (36.4 C)] 97.5 F (36.4 C) (12/18 0619) Pulse Rate:  [41-87] 81 (12/18 0915) Resp:  [14-18] 14 (12/18 0915) BP: (95-145)/(46-60) 145/46 mmHg (12/18 0915) SpO2:  [98 %-100 %] 100 % (12/18 0915) FiO2 (%):  [50 %] 50 % (12/18 0915)  HEMODYNAMICS:    VENTILATOR SETTINGS: Vent Mode:  [-] SIMV;PRVC;PSV FiO2 (%):  [50 %] 50 % Set Rate:  [12 bmp-20 bmp] 20 bmp Vt Set:  [575 mL-650 mL] 575 mL PEEP:  [5 cmH20] 5 cmH20 Pressure Support:  [10 cmH20] 10 cmH20 Plateau Pressure:  [21 cmH20] 21 cmH20  INTAKE / OUTPUT: Intake/Output     12/17 0701 - 12/18 0700 12/18 0701 - 12/19 0700   I.V.  1000   IV Piggyback  500   Total Intake   1500   Net   +1500          PHYSICAL EXAMINATION: General:  Frail elderly male in NAD Neuro:  Sedate, no distress HEENT:  Mm pink/moist,  no jvd Cardiovascular:  Unable to auscultate heart tones due to adventitious breath sounds  Lungs:  resp's even/non-labored, lungs bilaterally with coarse rhonchi Abdomen:  Flat,soft, bsx4 active  Musculoskeletal:  No acute deformities Skin:  Warm/dry, no edema  LABS:  CBC  Recent Labs Lab 03/04/13 1315 03/07/13 0807  WBC 5.5  --   HGB 9.4* 9.9*  HCT 29.4* 29.0*  PLT 211  --  Coag's  Recent Labs Lab 03/04/13 1315  APTT 32  INR 1.23   BMET  Recent Labs Lab 03/04/13 1315 03/07/13 0807  NA 140 143  K 5.4* 5.7*  CL 112  --   CO2 18*  --   BUN 38*  --   CREATININE 2.07*  --   GLUCOSE 83  --    Electrolytes  Recent Labs Lab 03/04/13 1315  CALCIUM 8.2*   Sepsis Markers No results found for this basename: LATICACIDVEN, PROCALCITON, O2SATVEN,  in the last 168 hours ABG  Recent Labs Lab 03/04/13 1315 03/07/13 0807  PHART 7.362 7.176*  PCO2ART 34.2* 60.9*  PO2ART 84.8 312.0*   Liver Enzymes  Recent Labs Lab 03/04/13 1315  AST 17  ALT 13  ALKPHOS 97  BILITOT 0.2*  ALBUMIN 2.1*   Cardiac Enzymes No results found for this basename: TROPONINI, PROBNP,  in the last 168 hours Glucose No results found for this basename: GLUCAP,  in the last 168 hours  Imaging Dg Chest Portable 1 View  03/07/2013   CLINICAL DATA:  Respiratory failure.  EXAM: PORTABLE CHEST - 1 VIEW  COMPARISON:  Chest x-ray of March 07, 2013 at 838 a.m.  FINDINGS: The right lung is adequately inflated. Minimal prominence of the interstitial markings is present but there has been some improvement since yesterday's study. On the left, the heart border remains obscured. There remains a small amount of pleural fluid along the inferior- sign rib lateral left thoracic wall. The interstitial markings of the mid and lower lung remain increased. No pneumothorax is evident. There is no significant shift of the mediastinum. The cardiopericardial silhouette remains mildly enlarged.  The  endotracheal tube tip lies 5.3 cm above the crotch of the carina. The right internal jugular venous catheter tip lies in the proximal to mid portion of the SVC. An esophagogastric tube has been placed whose tip and proximal port projects below the inferior margin of the film. The left-sided chest tube appears unchanged with its tip lying medially at the level off the posterior aspects of the 8th and 9th ribs.  IMPRESSION: Allowing for differences in positioning there has not been significant interval change in the appearance of the lungs or of the mediastinal structures. There has been interval the placement of an esophagogastric tube.   Electronically Signed   By: David  [name]   On: 03/07/2013 09:43   Dg Chest Port 1 View  03/07/2013   CLINICAL DATA:  Left-sided chest tube.  Left effusion.  EXAM: PORTABLE CHEST - 1 VIEW  COMPARISON:  Two-view chest 03/04/2013.  FINDINGS: The heart is enlarged. Left-sided chest tube has been placed. The effusion is markedly decreased. There now appears to be some pleural air on the left. A portion of the collection is likely loculated. There is minimal fluid within the minor fissure. Patient is rotated somewhat to the right.  A right IJ line terminates in the mid SVC. The patient is intubated. The endotracheal tube terminates 5.5 cm above the carina. Mild pulmonary vascular congestion is slightly increased.  IMPRESSION: 1. Interval placement of a left-sided chest tube with marked reduction in the left pleural fluid. 2. There is now some pleural gas over the left lower hemi thorax. 3. A portion of the left pleural effusion is likely loculated. 4. Slight increase in mild pulmonary vascular congestion. 5. Support apparatus as above, in satisfactory position.   Electronically Signed   By: Gennette Pac M.D.   On: 03/07/2013 08:54  ASSESSMENT / PLAN:  PULMONARY A: Acute Respiratory Failure Left Empyema - s/p chest tube placement in OR, VATS aborted due to hypotension with  medications P:   -full vent support, adjust vent settings -abg now and in am -trend cxr -chest tube per TCTS -Daily SBT -? If will return to OR in near future  CARDIOVASCULAR A:  ICM - EF 20% CHF Atrial Fibrillation MVR / AS Hypotension - intra op with medications, resolved. P:  -cycle enzymes with hypotension -assess EKG -ICU monitoring  RENAL A:   CKD - baseline cr 2.3 BPH P:   -trend BMP -foley post-op  GASTROINTESTINAL A:   SUP P:   -PPI -NPO -consider early feeding if not extubated   HEMATOLOGIC A:   Chronic Anemia Hx Non-Hodgkin's Lymphoma - resolved.  P:  -trend H/H -Lovenox for DVT  INFECTIOUS A:   L Empyema P:   -abx as above -follow cultures from OR -->? If any obtained given hemodynamic instability  ENDOCRINE A:   Gout   At risk hyper / hypoglycemia P:   -monitor CBG's Q4 while NPO  NEUROLOGIC A:   Sedation  P:   -PRN Fentanyl  Canary Brim, NP-C [facility] Pgr: [phone] or [phone] 03/07/2013, 10:06 AM   Reviewed above, examined pt and agree with assessment/plan.  He has Lt empyema and needed VATS.  He developed hypotension prior to procedure after receiving diprivan.  He had Lt chest tube inserted, but was not able to proceed with VATS decortication.  He remains on vent.  Will plan to optimize medical status, and then d/w TCTS when to reconsider doing VATS decortication.  CC time 40 minutes.  Coralyn Helling, MD [facility]/Critical Care 03/07/2013, 10:06 AM Pager:  [phone] After 3pm call: [phone]

## 2013-03-07 NOTE — Consult Note (Signed)
Name: [name] MRN: [id number] DOB: Aug 12, 1934    ADMISSION DATE:  03/07/2013 CONSULTATION DATE:  03/07/13  REFERRING MD :  Dr. Morton Peters PRIMARY SERVICE: TCTS  CHIEF COMPLAINT:  Post-Op Respiratory Failure  BRIEF PATIENT DESCRIPTION: 78 y/o M admitted 12/18 for planned VATS for loculated L pleural effusion.  SIGNIFICANT EVENTS / STUDIES:  12/18 - Admitted for VATS for L loculated effusion.  Unstable in OR, unable to complete surgical procedure per report  LINES / TUBES: OETT 12/18>>> R IJ TLC 12/18>>>  CULTURES: Pleural Fluid 12/18>>>  ANTIBIOTICS: Vanco 12/18>>> Zosyn 12/18>>>  HISTORY OF PRESENT ILLNESS:  78 y/o M, SNF resident with PMH of CAD, CHF/ICM w EF of 20%, MV Regurg, AV stenosis CKD (baseline 2.3), Non-Hodgkin's Lymphoma (resolved) and anemia who has been followed for L effusion / empyema since Oct 2014.  Post hospitalization, he was discharged to SNF. Previous fluid grew streptococcus and he was recommended to have VATS but refused.  He completed an oral course of Augmentin.  He was seen 12/08 by Dr. Sherene Sires and was again recommended for VATS for complicated effusion.  He continued to have increase in effusion by CXR and worsening SOB.  Patient was evaluated by Dr. Donata Clay on 12/12 for chronic loculated left pleural effusion.  Scheduled for VATS 12/18.  Reportedly, patient became to unstable to complete procedure in OR and was returned to ICU.    PAST MEDICAL HISTORY :  Past Medical History  Diagnosis Date  . Aortic insufficiency     a. Previously mod-severe;  b. 03/2012 Echo: Mild to Mod AI  . Atrial flutter     resolved  . Anemia   . PVCs (premature ventricular contractions)   . Chronic systolic CHF (congestive heart failure)     a.  03/2012 Echo: EF 15%, Mild to Mod AI, Mod MR  . Bilateral hydronephrosis     a. 03/2012 Renal u/s: Bilat Severe hydronephrosis, medical renal dzs.  . CKD (chronic kidney disease), stage III   . Non Hodgkin's lymphoma       resolved >10 yr ago  . Moderate mitral regurgitation     a.  03/2012 Echo: Mod MR  . BPH (benign prostatic hyperplasia)     a. noted on renal u/s 03/2012.  . Ischemic cardiomyopathy     a.  Lexiscan Myoview 04/24/12: Inferior apical MI, study not gated due to frequent PVCs, triplets => patient refuses cath  . Gout   . Cardiomyopathy, ischemic 04/24/2012  . Acute systolic CHF (congestive heart failure) 04/17/2012  . Atrial fibrillation 02/02/2013  . CAD (coronary artery disease) 04/24/2012  . Chronic systolic heart failure 04/24/2012  . Acute respiratory failure 01/31/2013  . CAP (community acquired pneumonia) 01/30/2013  . Pleural effusion, left 01/29/2013  . Empyema 01/30/2013  . Protein-calorie malnutrition, severe 01/31/2013  . Gait abnormality     Hx: of  . Mitral valve insufficiency     Hx: of   . Aortic valve stenosis     Hx: of  . Paroxysmal supraventricular tachycardia     Hx: of  . Shortness of breath    Past Surgical History  Procedure Laterality Date  . Lymph node dissection      Groin  . Inguinal hernia repair    . Colonoscopy w/ biopsies and polypectomy      Hx: of   . Tonsillectomy     Prior to Admission medications   Medication Sig Start Date End Date Taking? Authorizing Provider  docusate sodium (COLACE) 100 MG capsule Take 100 mg by mouth 2 (two) times daily.   Yes Historical Provider, MD  feeding supplement, ENSURE COMPLETE, (ENSURE COMPLETE) LIQD Take 237 mLs by mouth daily after supper. 02/08/13  Yes Leroy Sea, MD  ferrous sulfate 325 (65 FE) MG tablet Take 325 mg by mouth 2 (two) times daily with a meal.   Yes Historical Provider, MD  furosemide (LASIX) 40 MG tablet Take 40 mg by mouth daily as needed for fluid (for weight gain of 3 pounds or more).    Yes Historical Provider, MD  HYDROcodone-acetaminophen (NORCO/VICODIN) 5-325 MG per tablet Take 1 tablet by mouth every 4 hours as needed for mild pain *DO NOT EXCEED 4 GM OF TYLENOL IN 24 HOURS*; Take 2  tablets by mouth every 4 hours as needed for moderate to severe pain 02/08/13  Yes Man X Mast, NP  saccharomyces boulardii (FLORASTOR) 250 MG capsule Take 250 mg by mouth 2 (two) times daily.   Yes Historical Provider, MD  tamsulosin (FLOMAX) 0.4 MG CAPS capsule Take 1 capsule (0.4 mg total) by mouth daily after supper. 02/08/13  Yes Leroy Sea, MD  nitroGLYCERIN (NITROSTAT) 0.4 MG SL tablet Place 1 tablet (0.4 mg total) under the tongue every 5 (five) minutes x 3 doses as needed for chest pain. 06/22/12   Peter M [place], MD   Allergies  Allergen Reactions  . Contrast Media [Iodinated Diagnostic Agents] Other (See Comments)    Patient does not want dye because it is hard on his kidneys  . Prednisone Rash  . Shellfish Allergy Itching and Rash    Crab meat    FAMILY HISTORY:  Family History  Problem Relation Age of Onset  . Prostate cancer Father    SOCIAL HISTORY:  reports that he has never smoked. He has never used smokeless tobacco. He reports that he does not drink alcohol or use illicit drugs.  REVIEW OF SYSTEMS:  Unable to complete as pt is altered on vent.   SUBJECTIVE:   VITAL SIGNS: Temp:  [97.5 F (36.4 C)] 97.5 F (36.4 C) (12/18 0619) Pulse Rate:  [41-87] 81 (12/18 0722) Resp:  [18] 18 (12/18 0619) BP: (95)/(60) 95/60 mmHg (12/18 0619) SpO2:  [98 %] 98 % (12/18 0619)  HEMODYNAMICS:    VENTILATOR SETTINGS:    INTAKE / OUTPUT: Intake/Output     12/17 0701 - 12/18 0700 12/18 0701 - 12/19 0700   I.V.  1000   IV Piggyback  500   Total Intake   1500   Net   +1500          PHYSICAL EXAMINATION: General:  Frail elderly male in NAD Neuro:  Sedate, no distress HEENT:  Mm pink/moist, no jvd Cardiovascular:  Unable to auscultate heart tones due to adventitious breath sounds  Lungs:  resp's even/non-labored, lungs bilaterally with coarse rhonchi Abdomen:  Flat,soft, bsx4 active  Musculoskeletal:  No acute deformities Skin:  Warm/dry, no  edema  LABS:  CBC  Recent Labs Lab 03/04/13 1315 03/07/13 0807  WBC 5.5  --   HGB 9.4* 9.9*  HCT 29.4* 29.0*  PLT 211  --    Coag's  Recent Labs Lab 03/04/13 1315  APTT 32  INR 1.23   BMET  Recent Labs Lab 03/04/13 1315 03/07/13 0807  NA 140 143  K 5.4* 5.7*  CL 112  --   CO2 18*  --   BUN 38*  --   CREATININE 2.07*  --  GLUCOSE 83  --    Electrolytes  Recent Labs Lab 03/04/13 1315  CALCIUM 8.2*   Sepsis Markers No results found for this basename: LATICACIDVEN, PROCALCITON, O2SATVEN,  in the last 168 hours ABG  Recent Labs Lab 03/04/13 1315 03/07/13 0807  PHART 7.362 7.176*  PCO2ART 34.2* 60.9*  PO2ART 84.8 312.0*   Liver Enzymes  Recent Labs Lab 03/04/13 1315  AST 17  ALT 13  ALKPHOS 97  BILITOT 0.2*  ALBUMIN 2.1*   Cardiac Enzymes No results found for this basename: TROPONINI, PROBNP,  in the last 168 hours Glucose No results found for this basename: GLUCAP,  in the last 168 hours  Imaging Dg Chest Port 1 View  03/07/2013   CLINICAL DATA:  Left-sided chest tube.  Left effusion.  EXAM: PORTABLE CHEST - 1 VIEW  COMPARISON:  Two-view chest 03/04/2013.  FINDINGS: The heart is enlarged. Left-sided chest tube has been placed. The effusion is markedly decreased. There now appears to be some pleural air on the left. A portion of the collection is likely loculated. There is minimal fluid within the minor fissure. Patient is rotated somewhat to the right.  A right IJ line terminates in the mid SVC. The patient is intubated. The endotracheal tube terminates 5.5 cm above the carina. Mild pulmonary vascular congestion is slightly increased.  IMPRESSION: 1. Interval placement of a left-sided chest tube with marked reduction in the left pleural fluid. 2. There is now some pleural gas over the left lower hemi thorax. 3. A portion of the left pleural effusion is likely loculated. 4. Slight increase in mild pulmonary vascular congestion. 5. Support  apparatus as above, in satisfactory position.   Electronically Signed   By: Gennette Pac M.D.   On: 03/07/2013 08:54    ASSESSMENT / PLAN:  PULMONARY A: Acute Respiratory Failure Left Empyema - s/p chest tube placement in OR, VATS aborted due to hypotension with medications P:   -full vent support, adjust vent settings -abg now and in am -trend cxr -chest tube per TCTS -Daily SBT -? If will return to OR in near future  CARDIOVASCULAR A:  ICM - EF 20% CHF Atrial Fibrillation MVR / AS Hypotension - intra op with medications, resolved. P:  -cycle enzymes with hypotension -assess EKG -ICU monitoring  RENAL A:   CKD - baseline cr 2.3 BPH P:   -trend BMP -foley post-op  GASTROINTESTINAL A:   SUP P:   -PPI -NPO -consider early feeding if not extubated   HEMATOLOGIC A:   Chronic Anemia Hx Non-Hodgkin's Lymphoma - resolved.  P:  -trend H/H -Lovenox for DVT  INFECTIOUS A:   L Empyema P:   -abx as above -follow cultures from OR -->? If any obtained given hemodynamic instability  ENDOCRINE A:   Gout   At risk hyper / hypoglycemia P:   -monitor CBG's Q4 while NPO  NEUROLOGIC A:   Sedation  P:   -PRN Fentanyl  Canary Brim, NP-C [facility] Pgr: [phone] or [phone] 03/07/2013, 9:14 AM   Reviewed above, examined pt and agree with assessment/plan.  He has Lt empyema and needed VATS.  He developed hypotension prior to procedure after receiving diprivan.  He had Lt chest tube inserted, but was not able to proceed with VATS decortication.  He remains on vent.  Will plan to optimize medical status, and then d/w TCTS when to reconsider doing VATS decortication.  CC time 40 minutes.  Coralyn Helling, MD [facility]  03/07/2013, 10:03 AM Pager:  [phone] After 3pm call: [phone]

## 2013-03-07 NOTE — Anesthesia Postprocedure Evaluation (Signed)
  Anesthesia Post-op Note  Patient: [name]  Procedure(s) Performed: Procedure(s): CHEST TUBE INSERTION (Left) TALC PLEURADESIS (Left)  Patient Location: PACU and SICU  Anesthesia Type:General  Level of Consciousness: sedated  Airway and Oxygen Therapy: Patient remains intubated per anesthesia plan  Post-op Pain: mild  Post-op Assessment: Post-op Vital signs reviewed  Post-op Vital Signs: Reviewed  Complications: Patient hypotensive in OR case aborted and subglottic ETT placed for post op ventilation per surgeon

## 2013-03-07 NOTE — Procedures (Signed)
Extubation Procedure Note  Patient Details:   Name: [name] DOB: 23-Oct-1934 MRN: [id number]   Airway Documentation:     Evaluation  O2 sats: stable throughout Complications: No apparent complications Patient did tolerate procedure well. Bilateral Breath Sounds: Rhonchi Suctioning: Airway Yes Patient tolerated wean. Patient achieved VC of 550 mL and NIF of -18. MD and RN aware, to proceed with extubation. Positive for cuff leak. Extubated to a 4 Lpm nasal cannula. No signs of dyspnea or stridor. Patient instructed on usage of Incentive Spirometer, achieved 450 mL 5 times. Patient resting comfortably at this time. Will continue to monitor.  Ancil Boozer 03/07/2013, 12:07 PM

## 2013-03-07 NOTE — Progress Notes (Signed)
The patient was examined and preop studies reviewed. There has been no change from the prior exam and the patient is ready for surgery  plan Left VATS decortication on Sheppard Coil

## 2013-03-07 NOTE — Care Management Note (Signed)
    Page 1 of 1   03/07/2013     1:36:21 PM   CARE MANAGEMENT NOTE 03/07/2013  Patient:  BRAIAN, TIJERINA   Account Number:  [id number]  Date Initiated:  03/07/2013  Documentation initiated by:  Avie Arenas  Subjective/Objective Assessment:   presented from SNF for surgical procedure - during intake process - deteriorated requiring intubation.     Action/Plan:   Anticipated DC Date:  03/14/2013   Anticipated DC Plan:  SKILLED NURSING FACILITY  In-house referral  Clinical Social Worker      DC Planning Services  CM consult      Choice offered to / List presented to:             Status of service:  In process, will continue to follow Medicare Important Message given?   (If response is "NO", the following Medicare IM given date fields will be blank) Date Medicare IM given:   Date Additional Medicare IM given:    Discharge Disposition:    Per UR Regulation:  Reviewed for med. necessity/level of care/duration of stay  If discussed at Long Length of Stay Meetings, dates discussed:    Comments:  Contact:  Egge,Clarence Brother [phone]  03-07-13 1:30pm Avie Arenas, RNBSN [phone] From SNF - on vent - remains sedated - SW consult placed.

## 2013-03-07 NOTE — Preoperative (Signed)
Beta Blockers   Reason not to administer Beta Blockers:Not Applicable 

## 2013-03-07 NOTE — Transfer of Care (Signed)
Immediate Anesthesia Transfer of Care Note  Patient: [name]  Procedure(s) Performed: Procedure(s): CHEST TUBE INSERTION (Left) TALC PLEURADESIS (Left)  Patient Location: SICU  Anesthesia Type:General  Level of Consciousness: sedated, unresponsive and Patient remains intubated per anesthesia plan  Airway & Oxygen Therapy: Patient remains intubated per anesthesia plan and Patient placed on Ventilator (see vital sign flow sheet for setting)  Post-op Assessment: Report given to PACU RN and Post -op Vital signs reviewed and stable  Post vital signs: Reviewed and stable  Complications: No apparent anesthesia complications

## 2013-03-07 NOTE — Brief Op Note (Addendum)
03/07/2013  8:56 AM  PATIENT:  [name]  78 y.o. male  PRE-OPERATIVE DIAGNOSIS:  LOCULATED LEFT PLEURAL EFFUSION  POST-OPERATIVE DIAGNOSIS:  LOCULATED LEFT PLEURAL EFFUSION  PROCEDURE:  Procedure(s): CHEST TUBE INSERTION (Left) TALC PLEURADESIS (Left)  SURGEON:  Surgeon(s) and Role:    * Kerin Perna, MD - Primary  PHYSICIAN ASSISTANT: none  ASSISTANTS: none   ANESTHESIA:   general, local 1% lidocaine  EBL:  Total I/O In: 1500 [I.V.:1000; IV Piggyback:500] Out: -   BLOOD ADMINISTERED:none  DRAINS: 1 Chest Tube(s) in the left pleural space   LOCAL MEDICATIONS USED:  LIDOCAINE  and Amount: 5 ml  SPECIMEN:  Aspirate  DISPOSITION OF SPECIMEN: micro  COUNTS:  YES  TOURNIQUET:  none DICTATION: .Dragon Dictation  PLAN OF CARE: Admit to inpatient   PATIENT DISPOSITION:  ICU - intubated and critically ill.   Delay start of Pharmacological VTE agent (>24hrs) due to surgical blood loss or risk of bleeding: no   Patient became severely hypotensive with adequate rhythm upon anesthesia induction He was unresponsive to pressors - wide open neo- initially but responded with volume and time A chest tube drainage of loculated effusion- empyema was performed while still asleep and powdered talc insufflated. Patient will not tolerate formal VATS and decortication of LLL Chest tube will be managed as long term empyema drain.

## 2013-03-07 NOTE — Anesthesia Preprocedure Evaluation (Addendum)
Anesthesia Evaluation  Patient identified by MRN, date of birth, ID band Patient awake    Reviewed: Allergy & Precautions, H&P , NPO status , Patient's Chart, lab work & pertinent test results  Airway Mallampati: II      Dental  (+) Dental Advisory Given   Pulmonary shortness of breath,    + decreased breath sounds      Cardiovascular + CAD and +CHF + dysrhythmias Atrial Fibrillation Rhythm:Regular Rate:Normal  02/01/13 2D Echo:   Impressions:  - Mildly dilated LV with severe global hypokinesis, EF 20%.   The aortic valve was poorly visualized but there appeared   to be moderate eccentric aortic insufficiency. The aortic   root was severely dilated. Mild MR. Normal RV size and   systolic function. Mild pulmonary hypertension.    Neuro/Psych    GI/Hepatic negative GI ROS, Neg liver ROS,   Endo/Other    Renal/GU CRFRenal disease     Musculoskeletal   Abdominal   Peds  Hematology  (+) anemia ,   Anesthesia Other Findings   Reproductive/Obstetrics                        Anesthesia Physical Anesthesia Plan  ASA: IV  Anesthesia Plan: General   Post-op Pain Management:    Induction: Intravenous  Airway Management Planned: Double Lumen EBT  Additional Equipment: Arterial line and CVP  Intra-op Plan:   Post-operative Plan: Extubation in OR and Possible Post-op intubation/ventilation  Informed Consent: I have reviewed the patients History and Physical, chart, labs and discussed the procedure including the risks, benefits and alternatives for the proposed anesthesia with the patient or authorized representative who has indicated his/her understanding and acceptance.   Dental advisory given  Plan Discussed with: CRNA, Anesthesiologist and Surgeon  Anesthesia Plan Comments:         Anesthesia Quick Evaluation

## 2013-03-07 NOTE — Anesthesia Procedure Notes (Addendum)
Procedure Name: Intubation Date/Time: 03/07/2013 7:51 AM Performed by: Elberta Leatherwood Pre-anesthesia Checklist: Patient identified, Emergency Drugs available, Suction available and Patient being monitored Patient Re-evaluated:Patient Re-evaluated prior to inductionOxygen Delivery Method: Circle system utilized Preoxygenation: Pre-oxygenation with 100% oxygen Intubation Type: IV induction Ventilation: Mask ventilation without difficulty Laryngoscope Size: Mac and 3 Grade View: Grade II Endobronchial tube: Double lumen EBT and 39 Fr Number of attempts: 1 Airway Equipment and Method: Stylet and Video-laryngoscopy Placement Confirmation: ETT inserted through vocal cords under direct vision,  positive ETCO2 and breath sounds checked- equal and bilateral Tube secured with: Tape Dental Injury: Teeth and Oropharynx as per pre-operative assessment    Procedure Name: Intubation Date/Time: 03/07/2013 8:14 AM Performed by: Vita Barley E Pre-anesthesia Checklist: Patient identified, Emergency Drugs available, Suction available and Patient being monitored Patient Re-evaluated:Patient Re-evaluated prior to inductionOxygen Delivery Method: Circle system utilized Preoxygenation: Pre-oxygenation with 100% oxygen Intubation Type: IV induction Ventilation: Mask ventilation without difficulty Grade View: Grade I Tube type: Subglottic suction tube Tube size: 7.5 mm Number of attempts: 1 Airway Equipment and Method: Video-laryngoscopy and Bougie stylet Placement Confirmation: ETT inserted through vocal cords under direct vision,  positive ETCO2 and breath sounds checked- equal and bilateral Secured at: 24 cm Tube secured with: Tape Dental Injury: Teeth and Oropharynx as per pre-operative assessment

## 2013-03-08 ENCOUNTER — Inpatient Hospital Stay (HOSPITAL_COMMUNITY): Payer: Medicare Other

## 2013-03-08 DIAGNOSIS — J90 Pleural effusion, not elsewhere classified: Secondary | ICD-10-CM

## 2013-03-08 DIAGNOSIS — I5022 Chronic systolic (congestive) heart failure: Secondary | ICD-10-CM

## 2013-03-08 LAB — GLUCOSE, CAPILLARY
Glucose-Capillary: 113 mg/dL — ABNORMAL HIGH (ref 70–99)
Glucose-Capillary: 139 mg/dL — ABNORMAL HIGH (ref 70–99)
Glucose-Capillary: 82 mg/dL (ref 70–99)
Glucose-Capillary: 83 mg/dL (ref 70–99)
Glucose-Capillary: 96 mg/dL (ref 70–99)
Glucose-Capillary: 96 mg/dL (ref 70–99)

## 2013-03-08 LAB — BASIC METABOLIC PANEL
BUN: 36 mg/dL — ABNORMAL HIGH (ref 6–23)
CO2: 23 mEq/L (ref 19–32)
Calcium: 8.1 mg/dL — ABNORMAL LOW (ref 8.4–10.5)
Chloride: 111 mEq/L (ref 96–112)
Creatinine, Ser: 2.11 mg/dL — ABNORMAL HIGH (ref 0.50–1.35)
GFR calc Af Amer: 33 mL/min — ABNORMAL LOW (ref >90)
GFR calc non Af Amer: 29 mL/min — ABNORMAL LOW (ref >90)
Glucose, Bld: 106 mg/dL — ABNORMAL HIGH (ref 70–99)
Potassium: 5.3 mEq/L — ABNORMAL HIGH (ref 3.5–5.1)
Sodium: 140 mEq/L (ref 135–145)

## 2013-03-08 LAB — CBC
HCT: 27 % — ABNORMAL LOW (ref 39.0–52.0)
Hemoglobin: 8.5 g/dL — ABNORMAL LOW (ref 13.0–17.0)
MCH: 28.8 pg (ref 26.0–34.0)
MCHC: 31.5 g/dL (ref 30.0–36.0)
MCV: 91.5 fL (ref 78.0–100.0)
Platelets: 178 10*3/uL (ref 150–400)
RBC: 2.95 MIL/uL — ABNORMAL LOW (ref 4.22–5.81)
RDW: 19.2 % — ABNORMAL HIGH (ref 11.5–15.5)
WBC: 7.4 10*3/uL (ref 4.0–10.5)

## 2013-03-08 LAB — BLOOD GAS, ARTERIAL
Acid-base deficit: 2.8 mmol/L — ABNORMAL HIGH (ref 0.0–2.0)
Bicarbonate: 22.9 mEq/L (ref 20.0–24.0)
O2 Content: 4 L/min
O2 Saturation: 99.5 %
Patient temperature: 98.6
TCO2: 24.5 mmol/L (ref 0–100)
pCO2 arterial: 50.4 mmHg — ABNORMAL HIGH (ref 35.0–45.0)
pH, Arterial: 7.28 — ABNORMAL LOW (ref 7.350–7.450)
pO2, Arterial: 159 mmHg — ABNORMAL HIGH (ref 80.0–100.0)

## 2013-03-08 LAB — PHOSPHORUS: Phosphorus: 4.4 mg/dL (ref 2.3–4.6)

## 2013-03-08 LAB — MAGNESIUM: Magnesium: 1.9 mg/dL (ref 1.5–2.5)

## 2013-03-08 MED ORDER — TAMSULOSIN HCL 0.4 MG PO CAPS
0.4000 mg | ORAL_CAPSULE | Freq: Every day | ORAL | Status: DC
  Administered 2013-03-08 – 2013-03-09 (×2): 0.4 mg via ORAL
  Filled 2013-03-08 (×3): qty 1

## 2013-03-08 MED ORDER — DEXTROSE-NACL 5-0.45 % IV SOLN: INTRAVENOUS | Status: DC

## 2013-03-08 MED ORDER — PANTOPRAZOLE SODIUM 40 MG PO TBEC
40.0000 mg | DELAYED_RELEASE_TABLET | Freq: Every day | ORAL | Status: DC
  Administered 2013-03-08: 40 mg via ORAL
  Filled 2013-03-08: qty 1

## 2013-03-08 MED ORDER — TRAMADOL HCL 50 MG PO TABS
50.0000 mg | ORAL_TABLET | Freq: Four times a day (QID) | ORAL | Status: DC | PRN
  Administered 2013-03-10 – 2013-03-13 (×3): 50 mg via ORAL
  Filled 2013-03-08 (×3): qty 1

## 2013-03-08 MED ORDER — SACCHAROMYCES BOULARDII 250 MG PO CAPS
250.0000 mg | ORAL_CAPSULE | Freq: Two times a day (BID) | ORAL | Status: DC
  Administered 2013-03-08 – 2013-03-13 (×11): 250 mg via ORAL
  Filled 2013-03-08 (×12): qty 1

## 2013-03-08 MED ORDER — LEVALBUTEROL HCL 0.63 MG/3ML IN NEBU: 0.6300 mg | INHALATION_SOLUTION | RESPIRATORY_TRACT | Status: DC | PRN

## 2013-03-08 NOTE — Progress Notes (Signed)
ANTIBIOTIC CONSULT NOTE - INITIAL  Pharmacy Consult for Zosyn Indication: Empyema, s/p chest tube placement on 12/18 by TCTS  Allergies  Allergen Reactions  . Contrast Media [Iodinated Diagnostic Agents] Other (See Comments)    Patient does not want dye because it is hard on his kidneys  . Prednisone Rash  . Shellfish Allergy Itching and Rash    Crab meat    Patient Measurements: Height: 6' (182.9 cm) Weight: 154 lb 9.6 oz (70.126 kg) IBW/kg (Calculated) : 77.6   Vital Signs: Temp: 97.3 F (36.3 C) (12/19 0758) Temp src: Oral (12/19 0758) BP: 100/60 mmHg (12/19 0900) Pulse Rate: 113 (12/19 0900) Intake/Output from previous day: 12/18 0701 - 12/19 0700 In: 3150 [P.O.:500; I.V.:2000; IV Piggyback:650] Out: 1210 [Urine:765; Chest Tube:445] Intake/Output from this shift: Total I/O In: 100 [I.V.:100] Out: 80 [Urine:80]  Labs:  Recent Labs  03/07/13 0950 03/07/13 1029 03/08/13 0410  WBC 5.8  --  7.4  HGB 9.0* 11.2* 8.5*  PLT 174  --  178  CREATININE 2.10* 2.10* 2.11*   Estimated Creatinine Clearance: 29.5 ml/min (by C-G formula based on Cr of 2.11). No results found for this basename: VANCOTROUGH, Leodis Binet, VANCORANDOM, GENTTROUGH, GENTPEAK, GENTRANDOM, TOBRATROUGH, TOBRAPEAK, TOBRARND, AMIKACINPEAK, AMIKACINTROU, AMIKACIN,  in the last 72 hours   Microbiology: Recent Results (from the past 720 hour(s))  SURGICAL PCR SCREEN     Status: None   Collection Time    03/04/13  1:13 PM      Result Value Range Status   MRSA, PCR NEGATIVE  NEGATIVE Final   Staphylococcus aureus NEGATIVE  NEGATIVE Final   Comment:            The Xpert SA Assay (FDA     approved for NASAL specimens     in patients over 106 years of age),     is one component of     a comprehensive surveillance     program.  Test performance has     been validated by [employer] for patients greater     than or equal to 85 year old.     It is not intended     to diagnose infection nor to      guide or monitor treatment.  URINE CULTURE     Status: None   Collection Time    03/04/13  1:14 PM      Result Value Range Status   Specimen Description URINE, RANDOM   Final   Special Requests NONE   Final   Culture  Setup Time     Final   Value: 03/04/2013 14:00     Performed at [facility] Count     Final   Value: NO GROWTH     Performed at [facility]   Culture     Final   Value: NO GROWTH     Performed at [facility]   Report Status 03/05/2013 FINAL   Final  BODY FLUID CULTURE     Status: None   Collection Time    03/07/13  8:32 AM      Result Value Range Status   Specimen Description PLEURAL LEFT FLUID   Final   Special Requests PT ON ZINACEF FLUID   Final   Gram Stain     Final   Value: ABUNDANT WBC PRESENT,BOTH PMN AND MONONUCLEAR     NO ORGANISMS SEEN     Performed at [facility]   Culture PENDING   Incomplete   Report Status PENDING   Incomplete  BODY FLUID CULTURE     Status: None   Collection Time    03/07/13  8:35 AM      Result Value Range Status   Specimen Description PLEURAL LEFT FLUID   Final   Special Requests FLUID ON SWAB PT ON ZINACEF   Final   Gram Stain     Final   Value: NO WBC SEEN     NO ORGANISMS SEEN     Performed at [facility]   Culture PENDING   Incomplete   Report Status PENDING   Incomplete  ANAEROBIC CULTURE     Status: None   Collection Time    03/07/13  8:35 AM      Result Value Range Status   Specimen Description PLEURAL LEFT FLUID   Final   Special Requests FLUID 0N SWAB PT ON ZINACEF   Final   Gram Stain     Final   Value: RARE WBC PRESENT,BOTH PMN AND MONONUCLEAR     NO ORGANISMS SEEN     Performed at [facility]   Culture     Final   Value: NO ANAEROBES ISOLATED; CULTURE IN PROGRESS FOR 5 DAYS     Performed at [facility]   Report Status PENDING   Incomplete    Medical History: Past Medical History  Diagnosis Date  . Aortic insufficiency      a. Previously mod-severe;  b. 03/2012 Echo: Mild to Mod AI  . Atrial flutter     resolved  . Anemia   . PVCs (premature ventricular contractions)   . Chronic systolic CHF (congestive heart failure)     a.  03/2012 Echo: EF 15%, Mild to Mod AI, Mod MR  . Bilateral hydronephrosis     a. 03/2012 Renal u/s: Bilat Severe hydronephrosis, medical renal dzs.  . CKD (chronic kidney disease), stage III   . Non Hodgkin's lymphoma     resolved >10 yr ago  . Moderate mitral regurgitation     a.  03/2012 Echo: Mod MR  . BPH (benign prostatic hyperplasia)     a. noted on renal u/s 03/2012.  . Ischemic cardiomyopathy     a.  Lexiscan Myoview 04/24/12: Inferior apical MI, study not gated due to frequent PVCs, triplets => patient refuses cath  . Gout   . Cardiomyopathy, ischemic 04/24/2012  . Acute systolic CHF (congestive heart failure) 04/17/2012  . Atrial fibrillation 02/02/2013  . CAD (coronary artery disease) 04/24/2012  . Chronic systolic heart failure 04/24/2012  . Acute respiratory failure 01/31/2013  . CAP (community acquired pneumonia) 01/30/2013  . Pleural effusion, left 01/29/2013  . Empyema 01/30/2013  . Protein-calorie malnutrition, severe 01/31/2013  . Gait abnormality     Hx: of  . Mitral valve insufficiency     Hx: of   . Aortic valve stenosis     Hx: of  . Paroxysmal supraventricular tachycardia     Hx: of  . Shortness of breath     Medications:  Prescriptions prior to admission  Medication Sig Dispense Refill  . amoxicillin-clavulanate (AUGMENTIN) 875-125 MG per tablet Take 1 tablet by mouth 2 (two) times daily. For 10 days. Start 12.15.14 end 12.25.14      . docusate sodium (COLACE) 100 MG capsule Take 100 mg by mouth 2 (two) times daily.      . feeding supplement, ENSURE COMPLETE, (ENSURE COMPLETE) LIQD  Take 237 mLs by mouth daily after supper.      . ferrous sulfate 325 (65 FE) MG tablet Take 325 mg by mouth 2 (two) times daily with a meal.      . furosemide (LASIX) 40 MG  tablet Take 40 mg by mouth daily as needed for fluid (for weight gain of 3 pounds or more).       Marland Kitchen HYDROcodone-acetaminophen (NORCO/VICODIN) 5-325 MG per tablet Take 1 tablet by mouth every 4 hours as needed for mild pain *DO NOT EXCEED 4 GM OF TYLENOL IN 24 HOURS*; Take 2 tablets by mouth every 4 hours as needed for moderate to severe pain  180 tablet  0  . nitroGLYCERIN (NITROSTAT) 0.4 MG SL tablet Place 1 tablet (0.4 mg total) under the tongue every 5 (five) minutes x 3 doses as needed for chest pain.  25 tablet  3  . saccharomyces boulardii (FLORASTOR) 250 MG capsule Take 250 mg by mouth 2 (two) times daily.      . tamsulosin (FLOMAX) 0.4 MG CAPS capsule Take 1 capsule (0.4 mg total) by mouth daily after supper.  30 capsule     Assessment: 78 year old man with a history of left effusion/empyema since October, 2014.  Pleural fluid culture on 01/30/13 grew Strep intermedius, sensitive to penicillin.  Chest tube placed on 12/18 and cultures are pending. Zosyn to start empirically post op for empyema treatment.  Goal of Therapy:  Treat infection  Plan:  Zosyn 3.375g IV q8h (infuse over 4 hours) - already receiving Monitor renal function and cultures  Mickeal Skinner 03/08/2013,10:42 AM

## 2013-03-08 NOTE — Progress Notes (Signed)
1 Day Post-Op Procedure(s) (LRB): CHEST TUBE INSERTION (Left) TALC PLEURADESIS (Left) Subjective: S/P L chest tube placement for empyema OOB to chair , breathing comfortably CXR shows tube in good position, pain adequately controlled  Objective: Vital signs in last 24 hours: Temp:  [97.3 F (36.3 C)-97.8 F (36.6 C)] 97.3 F (36.3 C) (12/19 0758) Pulse Rate:  [43-113] 113 (12/19 0900) Cardiac Rhythm:  [-] Atrial fibrillation (12/19 0800) Resp:  [11-27] 18 (12/19 0900) BP: (86-145)/(48-78) 100/60 mmHg (12/19 0900) SpO2:  [96 %-100 %] 100 % (12/19 0935) Arterial Line BP: (101-155)/(48-70) 105/48 mmHg (12/19 0900) FiO2 (%):  [40 %-50 %] 40 % (12/18 1050) Weight:  [154 lb 8 oz (70.081 kg)-154 lb 9.6 oz (70.126 kg)] 154 lb 9.6 oz (70.126 kg) (12/19 0625)  Hemodynamic parameters for last 24 hours:  Afib/ NSR  Intake/Output from previous day: 12/18 0701 - 12/19 0700 In: 3150 [P.O.:500; I.V.:2000; IV Piggyback:650] Out: 1210 [Urine:765; Chest Tube:445] Intake/Output this shift: Total I/O In: 100 [I.V.:100] Out: 50 [Urine:50]  Slightly decreased BS on L No airleak from chest tube  Lab Results:  Recent Labs  03/07/13 0950 03/07/13 1029 03/08/13 0410  WBC 5.8  --  7.4  HGB 9.0* 11.2* 8.5*  HCT 28.6* 33.0* 27.0*  PLT 174  --  178   BMET:  Recent Labs  03/07/13 0950 03/07/13 1029 03/08/13 0410  NA 140 144 140  K 5.3* 5.1 5.3*  CL 110 113* 111  CO2 21  --  23  GLUCOSE 125* 129* 106*  BUN 37* 35* 36*  CREATININE 2.10* 2.10* 2.11*  CALCIUM 8.3*  --  8.1*    PT/INR: No results found for this basename: LABPROT, INR,  in the last 72 hours ABG    Component Value Date/Time   PHART 7.280* 03/08/2013 0415   HCO3 22.9 03/08/2013 0415   TCO2 24.5 03/08/2013 0415   ACIDBASEDEF 2.8* 03/08/2013 0415   O2SAT 99.5 03/08/2013 0415   CBG (last 3)   Recent Labs  03/07/13 2334 03/08/13 0411 03/08/13 0757  GLUCAP 139* 113* 83    Assessment/Plan: S/P Procedure(s)  (LRB): CHEST TUBE INSERTION (Left) TALC PLEURADESIS (Left)    chest tube will be long term and I will follow in office and slowly advance/ remove Cont IV antibiotics while in hospital- fluid removed was purulent- convert to po antibiotics at DC Convert to mini express prior to DC to SNF , where he was preop   LOS: 1 day    VAN TRIGT III,Shailene Demonbreun 03/08/2013

## 2013-03-08 NOTE — H&P (Signed)
Name: [name] MRN: [id number] DOB: October 07, 1934    ADMISSION DATE:  03/07/2013 CONSULTATION DATE:  03/07/13  REFERRING MD :  Dr. Morton Peters PRIMARY SERVICE: TCTS  CHIEF COMPLAINT:  Post-Op Respiratory Failure  BRIEF PATIENT DESCRIPTION: 78 y/o M admitted 12/18 for planned VATS for loculated L pleural effusion.  SIGNIFICANT EVENTS / STUDIES:  12/18 - Admitted for VATS for L loculated effusion.  Unstable in OR, unable to complete surgical procedure per report  LINES / TUBES: OETT 12/18>>>12/18 R IJ TLC 12/18>>> Radial aline 12/18>>>12/19  CULTURES: Pleural Fluid 12/18>>>  ANTIBIOTICS: Vanco 12/18>>> Zosyn 12/18>>>12/19  SUBJECTIVE:  Breathing better.  Denies chest pain.  VITAL SIGNS: Temp:  [97.3 F (36.3 C)-97.8 F (36.6 C)] 97.3 F (36.3 C) (12/19 0758) Pulse Rate:  [43-113] 113 (12/19 0900) Resp:  [11-27] 18 (12/19 0900) BP: (86-145)/(48-78) 100/60 mmHg (12/19 0900) SpO2:  [96 %-100 %] 100 % (12/19 0935) Arterial Line BP: (101-155)/(48-70) 105/48 mmHg (12/19 0900) FiO2 (%):  [40 %-50 %] 40 % (12/18 1050) Weight:  [154 lb 8 oz (70.081 kg)-154 lb 9.6 oz (70.126 kg)] 154 lb 9.6 oz (70.126 kg) (12/19 0625) 3 liters [place]  INTAKE / OUTPUT: Intake/Output     12/18 0701 - 12/19 0700 12/19 0701 - 12/20 0700   P.O. 500    I.V. (mL/kg) 2000 (28.5) 100 (1.4)   IV Piggyback 650    Total Intake(mL/kg) 3150 (44.9) 100 (1.4)   Urine (mL/kg/hr) 765 (0.5) 80 (0.4)   Chest Tube 445 (0.3)    Total Output 1210 80   Net +1940 +20          PHYSICAL EXAMINATION: General: No distress, sitting in chair Neuro: alert, normal strength HEENT:  Rt IJ site clean Cardiovascular: regular Lungs: decreased BS Lt base, Lt chest tube in place Abdomen: soft, non tender Musculoskeletal: no edema Skin: no rashes  LABS:  CBC  Recent Labs Lab 03/04/13 1315  03/07/13 0950 03/07/13 1029 03/08/13 0410  WBC 5.5  --  5.8  --  7.4  HGB 9.4*  < > 9.0* 11.2* 8.5*  HCT 29.4*  <  > 28.6* 33.0* 27.0*  PLT 211  --  174  --  178  < > = values in this interval not displayed.  Coag's  Recent Labs Lab 03/04/13 1315  APTT 32  INR 1.23   BMET  Recent Labs Lab 03/04/13 1315  03/07/13 0950 03/07/13 1029 03/08/13 0410  NA 140  < > 140 144 140  K 5.4*  < > 5.3* 5.1 5.3*  CL 112  --  110 113* 111  CO2 18*  --  21  --  23  BUN 38*  --  37* 35* 36*  CREATININE 2.07*  --  2.10* 2.10* 2.11*  GLUCOSE 83  --  125* 129* 106*  < > = values in this interval not displayed.  Electrolytes  Recent Labs Lab 03/04/13 1315 03/07/13 0950 03/08/13 0410  CALCIUM 8.2* 8.3* 8.1*  MG  --   --  1.9  PHOS  --   --  4.4   ABG  Recent Labs Lab 03/07/13 1115 03/07/13 1304 03/08/13 0415  PHART 7.309* 7.288* 7.280*  PCO2ART 43.5 44.7 50.4*  PO2ART 104.0* 79.0* 159.0*   Liver Enzymes  Recent Labs Lab 03/04/13 1315  AST 17  ALT 13  ALKPHOS 97  BILITOT 0.2*  ALBUMIN 2.1*   Cardiac Enzymes  Recent Labs Lab 03/07/13 0950 03/07/13 1500 03/07/13 2145  TROPONINI <  0.30 <0.30 <0.30   Glucose  Recent Labs Lab 03/07/13 1156 03/07/13 1631 03/07/13 1931 03/07/13 2334 03/08/13 0411 03/08/13 0757  GLUCAP 97 98 107* 139* 113* 83    Imaging Dg Chest Port 1 View  03/08/2013   CLINICAL DATA:  Chest tube insertion  EXAM: PORTABLE CHEST - 1 VIEW  COMPARISON:  Yesterday  FINDINGS: Endotracheal and NG tubes have been removed. Right internal jugular central venous catheter is stable. Left chest tube is stable. Loculated pneumothorax at the left base has improved. Pleural and parenchymal densities at the left base are stable. Central left upper lobe airspace disease is stable. Cardiomegaly.  IMPRESSION: Extubated.  Improved loculated left basilar pneumothorax with a stable chest tube.  Otherwise stable pleural and parenchymal changes in the left lung.   Electronically Signed   By: Maryclare Bean M.D.   On: 03/08/2013 07:54   Dg Chest Portable 1 View  03/07/2013   CLINICAL  DATA:  Respiratory failure.  EXAM: PORTABLE CHEST - 1 VIEW  COMPARISON:  Chest x-ray of March 07, 2013 at 838 a.m.  FINDINGS: The right lung is adequately inflated. Minimal prominence of the interstitial markings is present but there has been some improvement since yesterday's study. On the left, the heart border remains obscured. There remains a small amount of pleural fluid along the inferior- sign rib lateral left thoracic wall. The interstitial markings of the mid and lower lung remain increased. No pneumothorax is evident. There is no significant shift of the mediastinum. The cardiopericardial silhouette remains mildly enlarged.  The endotracheal tube tip lies 5.3 cm above the crotch of the carina. The right internal jugular venous catheter tip lies in the proximal to mid portion of the SVC. An esophagogastric tube has been placed whose tip and proximal port projects below the inferior margin of the film. The left-sided chest tube appears unchanged with its tip lying medially at the level off the posterior aspects of the 8th and 9th ribs.  IMPRESSION: Allowing for differences in positioning there has not been significant interval change in the appearance of the lungs or of the mediastinal structures. There has been interval the placement of an esophagogastric tube.   Electronically Signed   By: David  [place]   On: 03/07/2013 09:43   Dg Chest Port 1 View  03/07/2013   CLINICAL DATA:  Left-sided chest tube.  Left effusion.  EXAM: PORTABLE CHEST - 1 VIEW  COMPARISON:  Two-view chest 03/04/2013.  FINDINGS: The heart is enlarged. Left-sided chest tube has been placed. The effusion is markedly decreased. There now appears to be some pleural air on the left. A portion of the collection is likely loculated. There is minimal fluid within the minor fissure. Patient is rotated somewhat to the right.  A right IJ line terminates in the mid SVC. The patient is intubated. The endotracheal tube terminates 5.5 cm above  the carina. Mild pulmonary vascular congestion is slightly increased.  IMPRESSION: 1. Interval placement of a left-sided chest tube with marked reduction in the left pleural fluid. 2. There is now some pleural gas over the left lower hemi thorax. 3. A portion of the left pleural effusion is likely loculated. 4. Slight increase in mild pulmonary vascular congestion. 5. Support apparatus as above, in satisfactory position.   Electronically Signed   By: Gennette Pac M.D.   On: 03/07/2013 08:54    ASSESSMENT / PLAN: A:   L Empyema >> pleural fluid cx from 01/30/13 with Streptococcus intermedius. P:   -  chest tube per TCTS >> plan for long term chest tube drainage rather than attempting VATS again -d/c vancomycin 12/19 -D2 zosyn >> narrow abx once cx results available -f/u CXR 12/20  A: Acute Respiratory Failure prior to Lt VATS 12/18 >> resolved. P:   -oxygen to keep SpO2 > 92%  A:  Hx of ischemic cardiomyopathy, A fib, Mitral regurgitation, Aortic regurgitation. Hypotension prior to VATS 12/18; likely related to meds >> resolved. P:  -monitor hemodynamics -d/c a line 12/19 -keep CVL in for now -hold outpt lasix for now  A:   CKD stage III - baseline cr 2.3. Hx of BPH. P:   -monitor renal fx, urine outpt, electrolytes -resume flomax 12/19 -keep foley in for now  A:   Protein calorie malnutrition. P:   -regular diet -SUP no longer indicated >> will d/c protonix  A:   Chronic Anemia. Hx Non-Hodgkin's Lymphoma. P:  -f/u CBC -Lovenox for DVT  A:   Hx of Gout.   P:   -monitor clinically  D/c Dr. Donata Clay.  Transfer to SDU 3 south unit 12/19.  Coralyn Helling, MD [facility] Pulmonary/Critical Care 03/08/2013, 9:50 AM Pager:  [phone] After 3pm call: [phone]

## 2013-03-08 NOTE — Progress Notes (Signed)
CSW received phone call from [facility] at [facility], stating patient is from there and is able to return back when medically ready. Full assessment to follow. CSW reported off to unit social worker who will follow up .  Maree Krabbe, MSW, Theresia Majors [phone]

## 2013-03-08 NOTE — Op Note (Signed)
[name], [name] NO.:  [id number]  MEDICAL RECORD NO.:  [id number]  LOCATION:  2S09C                        FACILITY:  [facility]  PHYSICIAN:  Kerin Perna, M.D.  DATE OF BIRTH:  [birth year]  DATE OF PROCEDURE:  03/07/2013 DATE OF DISCHARGE:                              OPERATIVE REPORT   OPERATIONS: 1. Placement of left chest tube for drainage of loculated pleural     effusion. 2. Powdered talc insufflation of left pleural space.  SURGEON:  Kerin Perna, MD  PREOPERATIVE DIAGNOSES:  Recurrent loculated left pleural effusion, history of left lower lobe pneumonia, chronic renal failure, congestive heart failure.  POSTOPERATIVE DIAGNOSES:  Recurrent loculated left pleural effusion, history of left lower lobe pneumonia, chronic renal failure, congestive heart failure.  ANESTHESIA:  General by Judie Petit, M.D.  CLINICAL NOTE:  The patient is a 78 year old gentleman, currently residing in a nursing home, who recently had a prolonged hospitalization for left lower lobe pneumonia with a left empyema, treated with chest tube drainage and prolonged IV antibiotics.  At that time, video- assisted thoracoscopic drainage and decortication of left lower lobe was recommended, but the patient refused.  He since has re-thought the situation while being at the skilled nursing facility and feel strong enough to undergo surgery.  He has shortness of breath and states he cannot go on the way he is now.  I examined the patient in the office and reviewed results of his CT scan and chest x-ray studies with the patient and his friend-partner.  I discussed the details of left video-assisted thoracoscopic surgery to drain the empyema and to remove the pleural peel of the left lower lobe to allow re-expansion of the lung to eliminate the space.  I reviewed the major risks of the surgery including risks of bleeding, MI, stroke, prolonged ventilator dependence, air leak,  recurrent infection, and death.  The patient understood that his risk was increased due to his poor heart ejection fraction and heart failure and his chronic renal failure.  In fact, he wished to delay surgery in order to get his affairs and his will in order.  He consented to proceed with this operation under what I felt was an informed consent.  OPERATIVE FINDINGS: 1. Severe hypotension on induction of anesthesia, requiring high-dose     pressors and volume to restore blood pressure. 2. Decision was made not to proceed with formal VATS, but to     expeditiously insert a large bore chest tube to drain the loculated     effusion and to perform a talc pleurodesis. 3. The patient tolerated the procedure well and was returned to the     ICU, still intubated, but hemodynamically stable. 4. 350 mL of turbid fluid was drained from the left pleural space and     sent for studies.  OPERATIVE PROCEDURE:  The patient was brought to the operating room and placed supine on the operating table.  General anesthesia was induced. Shortly after that, the patient developed severe hypotension.  His heart rate and EKG complex remained unchanged, in baseline, and sinus alternating with atrial fibrillation.  He was given high-dose pressors (phenylephrine) and albumin volume re-expanders.  The decision was made to cancel the formal VATS and to perform a limited chest tube drainage of the loculated effusion.  The patient was kept supine and quickly prepped and draped over his left anterior chest.  A proper time-out was performed.  A small incision was made in the sixth interspace.  The left pleural space and cavity was entered.  Immediately, turbid fluid drained out of the site and this was sent for studies.  A 32-French chest tube with extra side holes cut was then inserted and directed posteriorly into the cavity.  It was suctioned and irrigated until dry.  Next, through the chest tube, a red rubber  Robinson catheter with powdered talc in a syringe was placed in the pleural space and the talc was insufflated.  The catheter was withdrawn and the chest tube was connected to a Pleur-Evac drainage system.  The chest tube was secured to the skin with silk sutures and a sterile dressing was applied.  The double-lumen tube has been exchanged for single-lumen tube by the Anesthesia Team.  The patient, at this point, was hemodynamically stable on low-dose neo and a stable cardiac rhythm. He was then returned directly to the intensive care unit on the ventilator.     Kerin Perna, M.D.     PV/MEDQ  D:  03/07/2013  T:  03/08/2013  Job:  [id number]  cc:   Charlaine Dalton. Sherene Sires, MD, FCCP

## 2013-03-09 ENCOUNTER — Inpatient Hospital Stay (HOSPITAL_COMMUNITY): Payer: Medicare Other

## 2013-03-09 LAB — CBC
HCT: 24.5 % — ABNORMAL LOW (ref 39.0–52.0)
Hemoglobin: 7.7 g/dL — ABNORMAL LOW (ref 13.0–17.0)
MCH: 28.4 pg (ref 26.0–34.0)
MCHC: 31.4 g/dL (ref 30.0–36.0)
MCV: 90.4 fL (ref 78.0–100.0)
Platelets: 129 10*3/uL — ABNORMAL LOW (ref 150–400)
RBC: 2.71 MIL/uL — ABNORMAL LOW (ref 4.22–5.81)
RDW: 19 % — ABNORMAL HIGH (ref 11.5–15.5)
WBC: 5.1 10*3/uL (ref 4.0–10.5)

## 2013-03-09 LAB — GLUCOSE, CAPILLARY
Glucose-Capillary: 116 mg/dL — ABNORMAL HIGH (ref 70–99)
Glucose-Capillary: 83 mg/dL (ref 70–99)
Glucose-Capillary: 83 mg/dL (ref 70–99)

## 2013-03-09 LAB — COMPREHENSIVE METABOLIC PANEL
ALT: 7 U/L (ref 0–53)
AST: 8 U/L (ref 0–37)
Albumin: 1.8 g/dL — ABNORMAL LOW (ref 3.5–5.2)
Alkaline Phosphatase: 58 U/L (ref 39–117)
BUN: 40 mg/dL — ABNORMAL HIGH (ref 6–23)
CO2: 25 mEq/L (ref 19–32)
Calcium: 7.6 mg/dL — ABNORMAL LOW (ref 8.4–10.5)
Chloride: 108 mEq/L (ref 96–112)
Creatinine, Ser: 2.17 mg/dL — ABNORMAL HIGH (ref 0.50–1.35)
GFR calc Af Amer: 32 mL/min — ABNORMAL LOW (ref >90)
GFR calc non Af Amer: 28 mL/min — ABNORMAL LOW (ref >90)
Glucose, Bld: 78 mg/dL (ref 70–99)
Potassium: 5.6 mEq/L — ABNORMAL HIGH (ref 3.5–5.1)
Sodium: 138 mEq/L (ref 135–145)
Total Bilirubin: 0.3 mg/dL (ref 0.3–1.2)
Total Protein: 5.7 g/dL — ABNORMAL LOW (ref 6.0–8.3)

## 2013-03-09 NOTE — Progress Notes (Signed)
Name: Marqual Mi MRN: [id number] DOB: [birth year]    ADMISSION DATE:  03/07/2013 CONSULTATION DATE:  03/07/13  REFERRING MD :  Dr. Morton Peters PRIMARY SERVICE: TCTS  CHIEF COMPLAINT:  Post-Op Respiratory Failure  BRIEF PATIENT DESCRIPTION: 78 y/o M admitted 12/18 for planned VATS for loculated L pleural effusion.  SIGNIFICANT EVENTS / STUDIES:  12/18 - Admitted for VATS for L loculated effusion.  Unstable in OR, unable to complete surgical procedure per report  LINES / TUBES: OETT 12/18>>>12/18 R IJ TLC 12/18>>> Radial aline 12/18>>>12/19 L Cest tube 12/19 >>  CULTURES: Pleural Fluid 12/18>>>  ANTIBIOTICS: Vanco 12/18> 12/19  Zosyn 12/18>   SUBJECTIVE:  Breathing better.    chest pain tolerable  VITAL SIGNS: Temp:  [97.9 F (36.6 C)-98.6 F (37 C)] 98.4 F (36.9 C) (12/20 1140) Pulse Rate:  [82-107] 89 (12/20 1140) Resp:  [14-22] 22 (12/20 1140) BP: (91-108)/(53-71) 91/53 mmHg (12/20 1140) SpO2:  [100 %] 100 % (12/20 1140) 02 rx 3 liters [place]  INTAKE / OUTPUT: Intake/Output     12/19 0701 - 12/20 0700 12/20 0701 - 12/21 0700   P.O. 240 480   I.V. (mL/kg) 480 (6.8) 80 (1.1)   IV Piggyback 150    Total Intake(mL/kg) 870 (12.4) 560 (8)   Urine (mL/kg/hr) 1120 (0.7) 350 (0.8)   Stool 1 (0)    Chest Tube 215 (0.1) 100 (0.2)   Total Output 1336 450   Net -466 +110        Stool Occurrence  1 x     PHYSICAL EXAMINATION: General: No distress, sitting in chair Neuro: alert, normal strength HEENT:  Rt IJ site clean Cardiovascular: regular Lungs: decreased BS Lt base, Lt chest tube in place Abdomen: soft, non tender Musculoskeletal: 1-2 + pitting  Bilateral LE  edema Skin: no rashes  LABS:  CBC  Recent Labs Lab 03/07/13 0950 03/07/13 1029 03/08/13 0410 03/09/13 0320  WBC 5.8  --  7.4 5.1  HGB 9.0* 11.2* 8.5* 7.7*  HCT 28.6* 33.0* 27.0* 24.5*  PLT 174  --  178 129*    Coag's  Recent Labs Lab 03/04/13 1315  APTT 32  INR 1.23    BMET  Recent Labs Lab 03/07/13 0950 03/07/13 1029 03/08/13 0410 03/09/13 0320  NA 140 144 140 138  K 5.3* 5.1 5.3* 5.6*  CL 110 113* 111 108  CO2 21  --  23 25  BUN 37* 35* 36* 40*  CREATININE 2.10* 2.10* 2.11* 2.17*  GLUCOSE 125* 129* 106* 78    Electrolytes  Recent Labs Lab 03/07/13 0950 03/08/13 0410 03/09/13 0320  CALCIUM 8.3* 8.1* 7.6*  MG  --  1.9  --   PHOS  --  4.4  --    ABG  Recent Labs Lab 03/07/13 1115 03/07/13 1304 03/08/13 0415  PHART 7.309* 7.288* 7.280*  PCO2ART 43.5 44.7 50.4*  PO2ART 104.0* 79.0* 159.0*   Liver Enzymes  Recent Labs Lab 03/04/13 1315 03/09/13 0320  AST 17 8  ALT 13 7  ALKPHOS 97 58  BILITOT 0.2* 0.3  ALBUMIN 2.1* 1.8*   Cardiac Enzymes  Recent Labs Lab 03/07/13 0950 03/07/13 1500 03/07/13 2145  TROPONINI <0.30 <0.30 <0.30   Glucose  Recent Labs Lab 03/08/13 1328 03/08/13 1758 03/08/13 2036 03/08/13 2349 03/09/13 0309 03/09/13 0759  GLUCAP 82 96 96 116* 83 83    Imaging Dg Chest Port 1 View  03/09/2013   CLINICAL DATA:  Left chest tube  EXAM: PORTABLE  CHEST - 1 VIEW  COMPARISON:  03/08/2013  FINDINGS: Support devices including left basilar chest tube remain in place, unchanged. Loculated left basilar pneumothorax again noted. Left lower lobe airspace opacity is stable. No confluent opacity on the right.  IMPRESSION: Stable exam.   Electronically Signed   By: Charlett Nose M.D.   On: 03/09/2013 08:04   Dg Chest Port 1 View  03/08/2013   CLINICAL DATA:  Chest tube insertion  EXAM: PORTABLE CHEST - 1 VIEW  COMPARISON:  Yesterday  FINDINGS: Endotracheal and NG tubes have been removed. Right internal jugular central venous catheter is stable. Left chest tube is stable. Loculated pneumothorax at the left base has improved. Pleural and parenchymal densities at the left base are stable. Central left upper lobe airspace disease is stable. Cardiomegaly.  IMPRESSION: Extubated.  Improved loculated left basilar  pneumothorax with a stable chest tube.  Otherwise stable pleural and parenchymal changes in the left lung.   Electronically Signed   By: Maryclare Bean M.D.   On: 03/08/2013 07:54    ASSESSMENT / PLAN: A:   L Empyema >> pleural fluid cx from 01/30/13 with Streptococcus intermedius S/p VATS 12/19  P:   -chest tube per TCTS   -d/c vancomycin 12/19 and continue zosyn per dashboard       A: Acute Respiratory Failure prior to Lt VATS 12/18 >> resolved. P:   -oxygen to keep SpO2 > 92%  A:  Hx of ischemic cardiomyopathy, A fib, Mitral regurgitation, Aortic regurgitation. Hypotension prior to VATS 12/18; likely related to meds >> resolved. P:  -monitor hemodynamics -d/cd a line 12/19 -keep CVL in for now -hold outpt lasix for now  A:   CKD stage III - baseline cr 2.3. Hx of BPH. P:   -monitor renal fx, urine outpt, electrolytes -resumed  flomax 12/19 and bp tolerating  -keep foley in for now  A:   Protein calorie malnutrition. P:   -regular diet -SUP no longer indicated >>   Off  protonix  A:   Chronic Anemia. Hx Non-Hodgkin's Lymphoma. P:  -f/u CBC -Lovenox for DVT  A:   Hx of Gout.   P:   -monitor clinically    Sandrea Hughs, MD Pulmonary and Critical Care Medicine [facility] Cell [phone] After 5:30 PM or weekends, call [phone]

## 2013-03-09 NOTE — Progress Notes (Addendum)
      [address]       [name] [address]             [phone]      2 Days Post-Op Procedure(s) (LRB): CHEST TUBE INSERTION (Left) TALC PLEURADESIS (Left)  Subjective:  [name] has no complaints this morning.  Objective: Vital signs in last 24 hours: Temp:  [97.9 F (36.6 C)-98.6 F (37 C)] 98.6 F (37 C) (12/20 0800) Pulse Rate:  [82-107] 85 (12/20 0800) Cardiac Rhythm:  [-] Atrial fibrillation (12/20 0800) Resp:  [14-22] 18 (12/20 0800) BP: (82-108)/(46-71) 98/56 mmHg (12/20 0800) SpO2:  [91 %-100 %] 100 % (12/20 0800) Arterial Line BP: (96-102)/(38-44) 102/44 mmHg (12/19 1100)  Intake/Output from previous day: 12/19 0701 - 12/20 0700 In: 870 [P.O.:240; I.V.:480; IV Piggyback:150] Out: 1336 [Urine:1120; Stool:1; Chest Tube:215] Intake/Output this shift: Total I/O In: 280 [P.O.:240; I.V.:40] Out: 450 [Urine:350; Chest Tube:100]  General appearance: alert, cooperative and no distress Heart: regular rate and rhythm Lungs: diminished breath sounds left base Abdomen: soft, non-tender; bowel sounds normal; no masses,  no organomegaly Wound: clean and dry  Lab Results:  Recent Labs  03/08/13 0410 03/09/13 0320  WBC 7.4 5.1  HGB 8.5* 7.7*  HCT 27.0* 24.5*  PLT 178 129*   BMET:  Recent Labs  03/08/13 0410 03/09/13 0320  NA 140 138  K 5.3* 5.6*  CL 111 108  CO2 23 25  GLUCOSE 106* 78  BUN 36* 40*  CREATININE 2.11* 2.17*  CALCIUM 8.1* 7.6*    PT/INR: No results found for this basename: LABPROT, INR,  in the last 72 hours ABG    Component Value Date/Time   PHART 7.280* 03/08/2013 0415   HCO3 22.9 03/08/2013 0415   TCO2 24.5 03/08/2013 0415   ACIDBASEDEF 2.8* 03/08/2013 0415   O2SAT 99.5 03/08/2013 0415   CBG (last 3)   Recent Labs  03/08/13 2349 03/09/13 0309 03/09/13 0759  GLUCAP 116* 83 83    Assessment/Plan: S/P Procedure(s) (LRB): CHEST TUBE INSERTION (Left) TALC PLEURADESIS (Left)  1. Chest Tube- 100  cc output since midnight, small air leak with cough, leave chest tube to suction for now 2. Fluid cultures remain negative, on ABX for Empyema 3. Care per primary   LOS: 2 days    [name] 03/09/2013   Chart reviewed, patient examined, agree with above. I don't see an air leak this afternoon. Will keep to suction today.

## 2013-03-10 ENCOUNTER — Inpatient Hospital Stay (HOSPITAL_COMMUNITY): Payer: Medicare Other

## 2013-03-10 LAB — CBC
Hemoglobin: 8.3 g/dL — ABNORMAL LOW (ref 13.0–17.0)
MCH: 28.4 pg (ref 26.0–34.0)
MCV: 91.1 fL (ref 78.0–100.0)
Platelets: 152 10*3/uL (ref 150–400)
RBC: 2.92 MIL/uL — ABNORMAL LOW (ref 4.22–5.81)
WBC: 6.7 10*3/uL (ref 4.0–10.5)

## 2013-03-10 LAB — BODY FLUID CULTURE
Culture: NO GROWTH
Culture: NO GROWTH
Gram Stain: NONE SEEN

## 2013-03-10 LAB — BASIC METABOLIC PANEL
CO2: 24 mEq/L (ref 19–32)
Calcium: 7.8 mg/dL — ABNORMAL LOW (ref 8.4–10.5)
GFR calc Af Amer: 36 mL/min — ABNORMAL LOW (ref >90)
Glucose, Bld: 143 mg/dL — ABNORMAL HIGH (ref 70–99)
Potassium: 5.4 mEq/L — ABNORMAL HIGH (ref 3.5–5.1)
Sodium: 138 mEq/L (ref 135–145)

## 2013-03-10 MED ORDER — SODIUM CHLORIDE 0.9 % IV BOLUS (SEPSIS)
500.0000 mL | Freq: Once | INTRAVENOUS | Status: AC
  Administered 2013-03-10: 500 mL via INTRAVENOUS

## 2013-03-10 MED ORDER — METOPROLOL TARTRATE 12.5 MG HALF TABLET
12.5000 mg | ORAL_TABLET | Freq: Two times a day (BID) | ORAL | Status: DC
  Administered 2013-03-10 – 2013-03-11 (×3): 12.5 mg via ORAL
  Filled 2013-03-10 (×3): qty 1

## 2013-03-10 NOTE — Progress Notes (Addendum)
      [address]       [place] [address]             [phone]      3 Days Post-Op Procedure(s) (LRB): CHEST TUBE INSERTION (Left) TALC PLEURADESIS (Left)  Subjective:  Mr. Fleig is resting comfortably this morning.   Objective: Vital signs in last 24 hours: Temp:  [98.2 F (36.8 C)-98.6 F (37 C)] 98.2 F (36.8 C) (12/21 0756) Pulse Rate:  [85-92] 90 (12/21 0756) Cardiac Rhythm:  [-] Normal sinus rhythm;Other (Comment) (12/21 0756) Resp:  [18-28] 19 (12/21 0756) BP: (91-102)/(50-65) 99/51 mmHg (12/21 0756) SpO2:  [98 %-100 %] 100 % (12/21 0756)  Intake/Output from previous day: 12/20 0701 - 12/21 0700 In: 1375 [P.O.:840; I.V.:460; IV Piggyback:75] Out: 1845 [Urine:1625; Chest Tube:220] Intake/Output this shift: Total I/O In: 20 [I.V.:20] Out: 30 [Chest Tube:30]  General appearance: alert, cooperative and no distress Heart: regular rate and rhythm Lungs: diminished breath sounds Left Base Abdomen: soft, non-tender; bowel sounds normal; no masses,  no organomegaly Wound: clean and dry  Lab Results:  Recent Labs  03/08/13 0410 03/09/13 0320  WBC 7.4 5.1  HGB 8.5* 7.7*  HCT 27.0* 24.5*  PLT 178 129*   BMET:  Recent Labs  03/08/13 0410 03/09/13 0320  NA 140 138  K 5.3* 5.6*  CL 111 108  CO2 23 25  GLUCOSE 106* 78  BUN 36* 40*  CREATININE 2.11* 2.17*  CALCIUM 8.1* 7.6*    PT/INR: No results found for this basename: LABPROT, INR,  in the last 72 hours ABG    Component Value Date/Time   PHART 7.280* 03/08/2013 0415   HCO3 22.9 03/08/2013 0415   TCO2 24.5 03/08/2013 0415   ACIDBASEDEF 2.8* 03/08/2013 0415   O2SAT 99.5 03/08/2013 0415   CBG (last 3)   Recent Labs  03/08/13 2349 03/09/13 0309 03/09/13 0759  GLUCAP 116* 83 83    Assessment/Plan: S/P Procedure(s) (LRB): CHEST TUBE INSERTION (Left) TALC PLEURADESIS (Left)  1. Chest tube- no air leak appreciated, 220 cc output yesterday, will place chest tube to  water seal 2. OR cultures remain negative, on ABX for empyema 3. Care per primary  LOS: 3 days    Lowella Dandy 03/10/2013  Agree with above. Chest tube output is still probably too high to put to [employer].

## 2013-03-10 NOTE — Progress Notes (Signed)
Clinical Social Work Department BRIEF PSYCHOSOCIAL ASSESSMENT 03/10/2013  Patient:  HERNANDO, REALI     Account Number:  [id number]     Admit date:  03/07/2013  Clinical Social Worker:  Hendricks Milo  Date/Time:  03/10/2013 10:16 AM  Referred by:  Physician  Date Referred:  03/08/2013 Referred for  SNF Placement   Other Referral:   Interview type:  Patient Other interview type:    PSYCHOSOCIAL DATA Living Status:  FACILITY Admitted from facility:  [facility] Level of care:  Skilled Nursing Facility Primary support name:  Reshad Saab [phone] Primary support relationship to patient:  SIBLING Degree of support available:   Very supportive per patient.    CURRENT CONCERNS  Other Concerns:    SOCIAL WORK ASSESSMENT / PLAN Clinical Social Worker (CSW) met with patient to discuss D/C plan. Patient reported that he was at [facility] skilled nursing facility for a week before coming to the hospital. Patient stated that he was there for short term rehab. Patient reported that he wants to return to [place] to finish his rehab after he leaves the hospital. Patient lives alone in [place] and plans on returning to his private residence after completing therapy at [facility]. Patient reported that he has family in [place] that can help care for him when he returns home.   Assessment/plan status:  Psychosocial Support/Ongoing Assessment of Needs Other assessment/ plan:   Information/referral to community resources:    PATIENT'S/FAMILY'S RESPONSE TO PLAN OF CARE: Patient thanked CSW for visit.

## 2013-03-10 NOTE — Progress Notes (Signed)
Name: [name] MRN: [id number] DOB: [birth year]    ADMISSION DATE:  03/07/2013 CONSULTATION DATE:  03/07/13  REFERRING MD :  Dr. Morton Peters PRIMARY SERVICE: TCTS  CHIEF COMPLAINT:  Post-Op Respiratory Failure  BRIEF PATIENT DESCRIPTION: 78 y/o M admitted 12/18 for planned VATS for loculated L pleural effusion.  SIGNIFICANT EVENTS / STUDIES:  12/18 - Admitted for VATS for L loculated effusion.  Unstable in OR, unable to complete surgical procedure 12/19 VATS with Pleurodesis per DR VanTight   LINES / TUBES: OETT 12/18>>>12/18 R IJ TLC 12/18>>> Radial aline 12/18>>>12/19 L Chest Tube 12/18 >>  CULTURES: Pleural Fluid 12/18> no org seen >>> BC x 2 12/18  >>>  ANTIBIOTICS: Vanco 12/18>>> Zosyn 12/18>>>12/19  SUBJECTIVE:  Breathing better.  Denies chest pain.  VITAL SIGNS: Temp:  [98.2 F (36.8 C)-98.6 F (37 C)] 98.2 F (36.8 C) (12/21 0756) Pulse Rate:  [85-92] 90 (12/21 0756) Resp:  [18-28] 19 (12/21 0756) BP: (91-102)/(50-65) 99/51 mmHg (12/21 0756) SpO2:  [98 %-100 %] 100 % (12/21 0756) 02 rx 3 liters [place]  INTAKE / OUTPUT: Intake/Output     12/20 0701 - 12/21 0700 12/21 0701 - 12/22 0700   P.O. 840    I.V. (mL/kg) 460 (6.6) 20 (0.3)   IV Piggyback 75    Total Intake(mL/kg) 1375 (19.6) 20 (0.3)   Urine (mL/kg/hr) 1625 (1)    Stool     Chest Tube 220 (0.1)    Total Output 1845     Net -470 +20        Stool Occurrence 1 x      PHYSICAL EXAMINATION: General: No distress, sitting in chair Neuro: alert, normal strength HEENT:  Rt IJ site clean Cardiovascular: regular Lungs: decreased BS Lt base, Lt chest tube in place Abdomen: soft, non tender Musculoskeletal:1-2 + pitting Bilateral LE edema  Skin: no rashes  LABS:  CBC  Recent Labs Lab 03/07/13 0950 03/07/13 1029 03/08/13 0410 03/09/13 0320  WBC 5.8  --  7.4 5.1  HGB 9.0* 11.2* 8.5* 7.7*  HCT 28.6* 33.0* 27.0* 24.5*  PLT 174  --  178 129*    Coag's  Recent Labs Lab  03/04/13 1315  APTT 32  INR 1.23   BMET  Recent Labs Lab 03/07/13 0950 03/07/13 1029 03/08/13 0410 03/09/13 0320  NA 140 144 140 138  K 5.3* 5.1 5.3* 5.6*  CL 110 113* 111 108  CO2 21  --  23 25  BUN 37* 35* 36* 40*  CREATININE 2.10* 2.10* 2.11* 2.17*  GLUCOSE 125* 129* 106* 78    Electrolytes  Recent Labs Lab 03/07/13 0950 03/08/13 0410 03/09/13 0320  CALCIUM 8.3* 8.1* 7.6*  MG  --  1.9  --   PHOS  --  4.4  --    ABG  Recent Labs Lab 03/07/13 1115 03/07/13 1304 03/08/13 0415  PHART 7.309* 7.288* 7.280*  PCO2ART 43.5 44.7 50.4*  PO2ART 104.0* 79.0* 159.0*   Liver Enzymes  Recent Labs Lab 03/04/13 1315 03/09/13 0320  AST 17 8  ALT 13 7  ALKPHOS 97 58  BILITOT 0.2* 0.3  ALBUMIN 2.1* 1.8*   Cardiac Enzymes  Recent Labs Lab 03/07/13 0950 03/07/13 1500 03/07/13 2145  TROPONINI <0.30 <0.30 <0.30   Glucose  Recent Labs Lab 03/08/13 1328 03/08/13 1758 03/08/13 2036 03/08/13 2349 03/09/13 0309 03/09/13 0759  GLUCAP 82 96 96 116* 83 83    Imaging Dg Chest Port 1 View  03/09/2013   CLINICAL  DATA:  Left chest tube  EXAM: PORTABLE CHEST - 1 VIEW  COMPARISON:  03/08/2013  FINDINGS: Support devices including left basilar chest tube remain in place, unchanged. Loculated left basilar pneumothorax again noted. Left lower lobe airspace opacity is stable. No confluent opacity on the right.  IMPRESSION: Stable exam.   Electronically Signed   By: Charlett Nose M.D.   On: 03/09/2013 08:04    ASSESSMENT / PLAN: A:   L Empyema >> pleural fluid cx from 01/30/13 with Streptococcus intermedius. P:   -chest tube per TCTS   - Zosyn per dashboard    A: Acute Respiratory Failure prior to Lt VATS 12/18 >> resolved. P:   -oxygen to keep SpO2 > 92%  A:  Hx of ischemic cardiomyopathy, A fib, Mitral regurgitation, Aortic regurgitation. Hypotension prior to VATS 12/18; likely related to meds >> resolved. P:  -monitor hemodynamics      A:   CKD stage  III - baseline cr 2.3. Hx of BPH. Lab Results  Component Value Date   CREATININE 2.17* 03/09/2013   CREATININE 2.11* 03/08/2013   CREATININE 2.10* 03/07/2013    P:   -monitor renal fx, urine outpt, electrolytes -resumed flomax 12/19 -keep foley in for now  A:   Protein calorie malnutrition. P:   -regular diet -SUP no longer indicated >> will d/c protonix  A:   Chronic Anemia. Hx Non-Hodgkin's Lymphoma.  Recent Labs Lab 03/07/13 1029 03/08/13 0410 03/09/13 0320  HGB 11.2* 8.5* 7.7*    P:  -f/u CBC on lovenox     A:   Hx of Gout.   P:   -monitor clinically      Sandrea Hughs, MD Pulmonary and Critical Care Medicine [facility] Cell [phone] After 5:30 PM or weekends, call [phone]

## 2013-03-11 ENCOUNTER — Inpatient Hospital Stay (HOSPITAL_COMMUNITY): Payer: Medicare Other

## 2013-03-11 DIAGNOSIS — I82A12 Acute embolism and thrombosis of left axillary vein: Secondary | ICD-10-CM | POA: Diagnosis not present

## 2013-03-11 DIAGNOSIS — R609 Edema, unspecified: Secondary | ICD-10-CM

## 2013-03-11 DIAGNOSIS — J90 Pleural effusion, not elsewhere classified: Secondary | ICD-10-CM

## 2013-03-11 MED ORDER — METOPROLOL TARTRATE 25 MG PO TABS
25.0000 mg | ORAL_TABLET | Freq: Two times a day (BID) | ORAL | Status: DC
  Administered 2013-03-12: 25 mg via ORAL
  Filled 2013-03-11 (×4): qty 1

## 2013-03-11 MED ORDER — COLCHICINE 0.6 MG PO TABS
0.6000 mg | ORAL_TABLET | Freq: Two times a day (BID) | ORAL | Status: AC
  Administered 2013-03-11 – 2013-03-12 (×4): 0.6 mg via ORAL
  Filled 2013-03-11 (×5): qty 1

## 2013-03-11 MED ORDER — METOPROLOL TARTRATE 50 MG PO TABS
50.0000 mg | ORAL_TABLET | Freq: Two times a day (BID) | ORAL | Status: DC
  Filled 2013-03-11 (×2): qty 1

## 2013-03-11 MED ORDER — ENOXAPARIN SODIUM 80 MG/0.8ML ~~LOC~~ SOLN
1.0000 mg/kg | Freq: Every day | SUBCUTANEOUS | Status: DC
  Administered 2013-03-11: 18:00:00 70 mg via SUBCUTANEOUS
  Filled 2013-03-11 (×2): qty 0.8

## 2013-03-11 NOTE — Progress Notes (Addendum)
      [address]       [place] [address]             [phone]      4 Days Post-Op Procedure(s) (LRB): CHEST TUBE INSERTION (Left) TALC PLEURADESIS (Left)  Subjective:  [name] has no new complaints this morning.    Objective: Vital signs in last 24 hours: Temp:  [97.2 F (36.2 C)-98.9 F (37.2 C)] 97.2 F (36.2 C) (12/22 0736) Pulse Rate:  [81-131] 114 (12/22 0736) Cardiac Rhythm:  [-] Normal sinus rhythm;Other (Comment) (12/22 0736) Resp:  [20-28] 25 (12/22 0736) BP: (94-107)/(61-66) 107/61 mmHg (12/22 0736) SpO2:  [94 %-97 %] 95 % (12/22 0736)  Intake/Output from previous day: 12/21 0701 - 12/22 0700 In: 380 [I.V.:380] Out: 2345 [Urine:2075; Chest Tube:270] Intake/Output this shift: Total I/O In: 20 [I.V.:20] Out: 145 [Urine:125; Chest Tube:20]  General appearance: alert, cooperative and no distress Heart: regular rate and rhythm Lungs: coarse, clears with cough Wound: Clean and dry  Lab Results:  Recent Labs  03/09/13 0320 03/10/13 0930  WBC 5.1 6.7  HGB 7.7* 8.3*  HCT 24.5* 26.6*  PLT 129* 152   BMET:  Recent Labs  03/09/13 0320 03/10/13 0930  NA 138 138  K 5.6* 5.4*  CL 108 107  CO2 25 24  GLUCOSE 78 143*  BUN 40* 36*  CREATININE 2.17* 1.99*  CALCIUM 7.6* 7.8*    PT/INR: No results found for this basename: LABPROT, INR,  in the last 72 hours ABG    Component Value Date/Time   PHART 7.280* 03/08/2013 0415   HCO3 22.9 03/08/2013 0415   TCO2 24.5 03/08/2013 0415   ACIDBASEDEF 2.8* 03/08/2013 0415   O2SAT 99.5 03/08/2013 0415   CBG (last 3)   Recent Labs  03/08/13 2349 03/09/13 0309 03/09/13 0759  GLUCAP 116* 83 83    Assessment/Plan: S/P Procedure(s) (LRB): CHEST TUBE INSERTION (Left) TALC PLEURADESIS (Left)  1. Chest tube- no air leak appreciated, 270 cc output yesterday, will place to [employer] today 2. Empyema- will continue IV ABX 3. Dispo- chest tube to remain in place at discharge,  will advance tube in our office  4. Care per primary   LOS: 4 days    [name] 03/11/2013  Will convert to [employer] Cont po Augmentin for 10 days at [facility] CXR today is satisfactory- L basilar space well drained Will follow as outpatient to slowly remove empyema tube  patient examined and medical record reviewed,agree with above note. [name] 03/11/2013

## 2013-03-11 NOTE — Progress Notes (Signed)
Name: [name] MRN: [id number] DOB: June 11, 1934    ADMISSION DATE:  03/07/2013 CONSULTATION DATE:  03/07/13  REFERRING MD :  Dr. Morton Peters PRIMARY SERVICE: TCTS  CHIEF COMPLAINT:  Post-Op Respiratory Failure  BRIEF PATIENT DESCRIPTION: 78 y/o M admitted 12/18 for planned VATS for loculated L pleural effusion.  SIGNIFICANT EVENTS / STUDIES:  12/18 - Admitted for VATS for L loculated effusion.  Unstable in OR, unable to complete surgical procedure 12/19 VATS with Pleurodesis per DR VanTight   LINES / TUBES: OETT 12/18>>>12/18 R IJ TLC 12/18>>> Radial aline 12/18>>>12/19 L Chest Tube 12/18 >>  CULTURES: Pleural Fluid 12/18> no org seen >>> BC x 2 12/18  >>>  ANTIBIOTICS: Vanco 12/18>>> Zosyn 12/18>>>12/19  SUBJECTIVE:  Breathing better.  Denies chest pain. C/o R hand pain  VITAL SIGNS: Temp:  [97.2 F (36.2 C)-98.9 F (37.2 C)] 97.2 F (36.2 C) (12/22 0736) Pulse Rate:  [81-131] 114 (12/22 0736) Resp:  [20-28] 25 (12/22 0736) BP: (94-107)/(61-66) 107/61 mmHg (12/22 0736) SpO2:  [94 %-97 %] 95 % (12/22 0736) 02 rx 3 liters [place]  INTAKE / OUTPUT: Intake/Output     12/21 0701 - 12/22 0700 12/22 0701 - 12/23 0700   P.O.     I.V. (mL/kg) 380 (5.4) 20 (0.3)   IV Piggyback     Total Intake(mL/kg) 380 (5.4) 20 (0.3)   Urine (mL/kg/hr) 2075 (1.2) 125 (0.6)   Chest Tube 270 (0.2) 20 (0.1)   Total Output 2345 145   Net -1965 -125          PHYSICAL EXAMINATION: General: No distress, sitting in chair Neuro: alert, normal strength HEENT:  Rt IJ site clean Cardiovascular: regular Lungs: decreased BS Lt base, Lt chest tube in place Abdomen: soft, non tender Musculoskeletal:1-2 + pitting Bilateral LE edema  LUE edema 2+ Acute gouty flare  Skin: no rashes  LABS:  CBC  Recent Labs Lab 03/08/13 0410 03/09/13 0320 03/10/13 0930  WBC 7.4 5.1 6.7  HGB 8.5* 7.7* 8.3*  HCT 27.0* 24.5* 26.6*  PLT 178 129* 152    Coag's  Recent Labs Lab  03/04/13 1315  APTT 32  INR 1.23   BMET  Recent Labs Lab 03/08/13 0410 03/09/13 0320 03/10/13 0930  NA 140 138 138  K 5.3* 5.6* 5.4*  CL 111 108 107  CO2 23 25 24   BUN 36* 40* 36*  CREATININE 2.11* 2.17* 1.99*  GLUCOSE 106* 78 143*    Electrolytes  Recent Labs Lab 03/08/13 0410 03/09/13 0320 03/10/13 0930  CALCIUM 8.1* 7.6* 7.8*  MG 1.9  --   --   PHOS 4.4  --   --    ABG  Recent Labs Lab 03/07/13 1115 03/07/13 1304 03/08/13 0415  PHART 7.309* 7.288* 7.280*  PCO2ART 43.5 44.7 50.4*  PO2ART 104.0* 79.0* 159.0*   Liver Enzymes  Recent Labs Lab 03/04/13 1315 03/09/13 0320  AST 17 8  ALT 13 7  ALKPHOS 97 58  BILITOT 0.2* 0.3  ALBUMIN 2.1* 1.8*   Cardiac Enzymes  Recent Labs Lab 03/07/13 0950 03/07/13 1500 03/07/13 2145  TROPONINI <0.30 <0.30 <0.30   Glucose  Recent Labs Lab 03/08/13 1328 03/08/13 1758 03/08/13 2036 03/08/13 2349 03/09/13 0309 03/09/13 0759  GLUCAP 82 96 96 116* 83 83    Imaging Dg Chest Port 1 View  03/11/2013   CLINICAL DATA:  VATS  EXAM: PORTABLE CHEST - 1 VIEW  COMPARISON:  03/10/2013  FINDINGS: Right jugular central venous catheter  remains in place with tip overlying the lower SVC. Left-sided chest tube is unchanged. The heart remains enlarged. Loculated left basilar hydropneumothorax does not appear significantly changed. There may be a tiny left apical pneumothorax as well. Left basilar airspace opacity does not appear significantly changed. No right-sided airspace consolidation is identified.  IMPRESSION: No significant interval change in the appearance of left basilar hydropneumothorax and left basilar airspace opacity.   Electronically Signed   By: Sebastian Ache   On: 03/11/2013 07:58   Dg Chest Port 1 View  03/10/2013   CLINICAL DATA:  VAT S  EXAM: PORTABLE CHEST - 1 VIEW  COMPARISON:  03/09/2013  FINDINGS: Left chest tube remains in place, unchanged. Suspect tiny left apical pneumothorax. Continued loculated  left basilar hydro pneumothorax. Left lower lobe airspace opacity, unchanged. No confluent opacity on the right. Cardiomegaly.  IMPRESSION: Continued left basilar loculated hydro pneumothorax. Tiny left apical pneumothorax.   Electronically Signed   By: Charlett Nose M.D.   On: 03/10/2013 08:38    ASSESSMENT / PLAN: A:   L Empyema >> pleural fluid cx from 01/30/13 with Streptococcus intermedius. P:   -chest tube per TCTS   - Zosyn per dashboard    A: Acute Respiratory Failure prior to Lt VATS 12/18 >> resolved. P:   -oxygen to keep SpO2 > 92%  A:  Hx of ischemic cardiomyopathy, A fib, Mitral regurgitation, Aortic regurgitation. Hypotension prior to VATS 12/18; likely related to meds >> resolved. afib RVR RUE Edema P:  -monitor hemodynamics -increase metoprolol -venous doppler LUE      A:   CKD stage III - baseline cr 2.3. Hx of BPH. Lab Results  Component Value Date   CREATININE 1.99* 03/10/2013   CREATININE 2.17* 03/09/2013   CREATININE 2.11* 03/08/2013    P:   -monitor renal fx, urine outpt, electrolytes -resumed flomax 12/19 D/c foley  A:   Protein calorie malnutrition. P:   -regular diet Assess swallow  A:   Chronic Anemia. Hx Non-Hodgkin's Lymphoma.  Recent Labs Lab 03/08/13 0410 03/09/13 0320 03/10/13 0930  HGB 8.5* 7.7* 8.3*    P:  -f/u CBC on lovenox    MUSCULOSKELETAL A:   Hx of Gout.  With acute flare P:   -colchicine Rx       Caryl Bis  [phone]  Cell  [phone]  If no response or cell goes to voicemail, call beeper [phone]

## 2013-03-11 NOTE — Progress Notes (Signed)
OT Cancellation Note  Patient Details Name: [name] MRN: [id number] DOB: 06/18/1934   Cancelled Treatment:    Reason Eval/Treat Not Completed: Medical issues which prohibited therapy Noted DVTs. PLease advise if OK to proceed with OT/PT. Thank you. [facility] Meriam Chojnowski, OTR/L  [phone] 03/11/2013 03/11/2013, 2:26 PM

## 2013-03-11 NOTE — Evaluation (Signed)
Clinical/Bedside Swallow Evaluation Patient Details  Name: [name] MRN: [id number] Date of Birth: [birth year]  Today's Date: 03/11/2013 Time: 4098-1191 SLP Time Calculation (min): 19 min  Past Medical History:  Past Medical History  Diagnosis Date  . Aortic insufficiency     a. Previously mod-severe;  b. 03/2012 Echo: Mild to Mod AI  . Atrial flutter     resolved  . Anemia   . PVCs (premature ventricular contractions)   . Chronic systolic CHF (congestive heart failure)     a.  03/2012 Echo: EF 15%, Mild to Mod AI, Mod MR  . Bilateral hydronephrosis     a. 03/2012 Renal u/s: Bilat Severe hydronephrosis, medical renal dzs.  . CKD (chronic kidney disease), stage III   . Non Hodgkin's lymphoma     resolved >10 yr ago  . Moderate mitral regurgitation     a.  03/2012 Echo: Mod MR  . BPH (benign prostatic hyperplasia)     a. noted on renal u/s 03/2012.  . Ischemic cardiomyopathy     a.  Lexiscan Myoview 04/24/12: Inferior apical MI, study not gated due to frequent PVCs, triplets => patient refuses cath  . Gout   . Cardiomyopathy, ischemic 04/24/2012  . Acute systolic CHF (congestive heart failure) 04/17/2012  . Atrial fibrillation 02/02/2013  . CAD (coronary artery disease) 04/24/2012  . Chronic systolic heart failure 04/24/2012  . Acute respiratory failure 01/31/2013  . CAP (community acquired pneumonia) 01/30/2013  . Pleural effusion, left 01/29/2013  . Empyema 01/30/2013  . Protein-calorie malnutrition, severe 01/31/2013  . Gait abnormality     Hx: of  . Mitral valve insufficiency     Hx: of   . Aortic valve stenosis     Hx: of  . Paroxysmal supraventricular tachycardia     Hx: of  . Shortness of breath    Past Surgical History:  Past Surgical History  Procedure Laterality Date  . Lymph node dissection      Groin  . Inguinal hernia repair    . Colonoscopy w/ biopsies and polypectomy      Hx: of   . Tonsillectomy    . Chest tube insertion  03/07/2013     PLANNED VATS            DR Maren Beach   HPI:  78 y/o M admitted 12/18 for planned VATS for loculated L pleural effusion. 12/18, Patient unstable in OT, unable to complete surgical procedure. 12/19, VATS with pleurodesis. Patient with protein calorie malnutrition, SLP consult ordered.    Assessment / Plan / Recommendation Clinical Impression  Swallow evaluation complete. Patient with a functional appearing oropharyngeal swallow however with consistent multiple, audible swallows with thin liquids suggestive of an anatomical component and/or esophageal dysfunction. Patient does not however endorse globus, GER, or discomfort with swallow and is with only minimal indication of aspiration characterized by one episode of coughing post swallow. Baseline cough makes diagnostics at bedside difficult. Given noted malnutrition and ? of infiltrate on previous CXR, along with ? s/s of aspiration, will f/u briefly to assist in minimizing risk of aspiration and determine need for additional objective testing. At this time, patient appears safe to continue on a regular diet, thin liquids with general safe swallowing strategies.     Aspiration Risk  Mild    Diet Recommendation Regular;Thin liquid   Liquid Administration via: Cup;Straw Medication Administration: Whole meds with liquid Supervision: Patient able to self feed;Intermittent supervision to cue for compensatory strategies Compensations: Slow rate;Small sips/bites Postural  Changes and/or Swallow Maneuvers: Seated upright 90 degrees;Upright 30-60 min after meal    Other  Recommendations Oral Care Recommendations: Oral care BID   Follow Up Recommendations  None    Frequency and Duration min 2x/week  2 weeks   Pertinent Vitals/Pain n/a        Swallow Study    General HPI: 78 y/o M admitted 12/18 for planned VATS for loculated L pleural effusion. 12/18, Patient unstable in OT, unable to complete surgical procedure. 12/19, VATS with pleurodesis. Patient with  protein calorie malnutrition, SLP consult ordered.  Type of Study: Bedside swallow evaluation Previous Swallow Assessment: none Diet Prior to this Study: Regular;Thin liquids Temperature Spikes Noted: No Respiratory Status: Nasal cannula History of Recent Intubation: Yes Length of Intubations (days):  (surgery only) Behavior/Cognition: Alert;Cooperative;Pleasant mood Oral Cavity - Dentition: Adequate natural dentition Self-Feeding Abilities: Able to feed self Patient Positioning: Upright in bed Baseline Vocal Quality: Clear Volitional Cough: Strong Volitional Swallow: Able to elicit    Oral/Motor/Sensory Function Overall Oral Motor/Sensory Function: Appears within functional limits for tasks assessed   Ice Chips Ice chips: Not tested   Thin Liquid Thin Liquid: Impaired Presentation: Cup;Straw Pharyngeal  Phase Impairments: Suspected delayed Swallow;Decreased hyoid-laryngeal movement;Multiple swallows;Cough - Immediate (audible swallow)    Nectar Thick Nectar Thick Liquid: Not tested   Honey Thick Honey Thick Liquid: Not tested   Puree Puree: Not tested   Solid   GO   Darick Fetters MA, CCC-SLP [phone]  Solid: Within functional limits Presentation: Self Fed       Jair Lindblad Meryl 03/11/2013,3:14 PM

## 2013-03-11 NOTE — Progress Notes (Signed)
ANTICOAGULATION CONSULT NOTE - Initial Consult  Pharmacy Consult for lovenox Indication: DVT  Allergies  Allergen Reactions  . Contrast Media [Iodinated Diagnostic Agents] Other (See Comments)    Patient does not want dye because it is hard on his kidneys  . Prednisone Rash  . Shellfish Allergy Itching and Rash    Crab meat    Patient Measurements: Height: 6' (182.9 cm) Weight: 154 lb 9.6 oz (70.126 kg) IBW/kg (Calculated) : 77.6  Vital Signs: Temp: 97.7 F (36.5 C) (12/22 1154) Temp src: Oral (12/22 1154) BP: 91/60 mmHg (12/22 1154) Pulse Rate: 77 (12/22 1154)  Labs:  Recent Labs  03/09/13 0320 03/10/13 0930  HGB 7.7* 8.3*  HCT 24.5* 26.6*  PLT 129* 152  CREATININE 2.17* 1.99*    Estimated Creatinine Clearance: 31.3 ml/min (by C-G formula based on Cr of 1.99).  Assessment: 78 year old male s/p VATS for L loculated effusion. Dopplers done today show partial DVT noted behind venous valve leaflets of the left subclavian vein, left axillary vein, and left brachial vein, Partial superficial thrombosis behind the venous valve leaflets of the basilic vein.   Patient was on low dose sq lovenox for px, new orders received to change to full dose lovenox per ccm. Given reduced renal function will dose daily. Will check cbc in am then q72h while on lovenox.  Goal of Therapy:  Anti-Xa level 0.6-1 units/ml 4hrs after LMWH dose given Monitor platelets by anticoagulation protocol: Yes   Plan:  Lovenox 70mg  q24 hours CBC q72 hours  Sheppard Coil PharmD., BCPS Clinical Pharmacist Pager [phone] 03/11/2013 3:41 PM

## 2013-03-11 NOTE — Progress Notes (Signed)
*  PRELIMINARY RESULTS* Vascular Ultrasound Left upper extremity venous duplex has been completed.  Preliminary findings: Partial DVT noted behind venous valve leaflets of the left subclavian vein, left axillary vein, and left brachial vein. Partial superficial thrombosis behind the venous valve leaflets of the basilic vein.  Farrel Demark, RDMS, RVT  03/11/2013, 11:55 AM

## 2013-03-11 NOTE — Progress Notes (Signed)
ANTIBIOTIC CONSULT NOTE - INITIAL  Pharmacy Consult for Zosyn Indication: Empyema, s/p chest tube placement on 12/18 by TCTS  Allergies  Allergen Reactions  . Contrast Media [Iodinated Diagnostic Agents] Other (See Comments)    Patient does not want dye because it is hard on his kidneys  . Prednisone Rash  . Shellfish Allergy Itching and Rash    Crab meat    Patient Measurements: Height: 6' (182.9 cm) Weight: 154 lb 9.6 oz (70.126 kg) IBW/kg (Calculated) : 77.6   Vital Signs: Temp: 97.2 F (36.2 C) (12/22 0736) Temp src: Oral (12/22 0736) BP: 107/61 mmHg (12/22 0736) Pulse Rate: 114 (12/22 0736) Intake/Output from previous day: 12/21 0701 - 12/22 0700 In: 380 [I.V.:380] Out: 2345 [Urine:2075; Chest Tube:270] Intake/Output from this shift: Total I/O In: 20 [I.V.:20] Out: 145 [Urine:125; Chest Tube:20]  Labs:  Recent Labs  03/09/13 0320 03/10/13 0930  WBC 5.1 6.7  HGB 7.7* 8.3*  PLT 129* 152  CREATININE 2.17* 1.99*   Estimated Creatinine Clearance: 31.3 ml/min (by C-G formula based on Cr of 1.99). No results found for this basename: VANCOTROUGH, Leodis Binet, VANCORANDOM, GENTTROUGH, GENTPEAK, GENTRANDOM, TOBRATROUGH, TOBRAPEAK, TOBRARND, AMIKACINPEAK, AMIKACINTROU, AMIKACIN,  in the last 72 hours   Microbiology: Recent Results (from the past 720 hour(s))  SURGICAL PCR SCREEN     Status: None   Collection Time    03/04/13  1:13 PM      Result Value Range Status   MRSA, PCR NEGATIVE  NEGATIVE Final   Staphylococcus aureus NEGATIVE  NEGATIVE Final   Comment:            The Xpert SA Assay (FDA     approved for NASAL specimens     in patients over 74 years of age),     is one component of     a comprehensive surveillance     program.  Test performance has     been validated by [employer] for patients greater     than or equal to 29 year old.     It is not intended     to diagnose infection nor to     guide or monitor treatment.  URINE CULTURE      Status: None   Collection Time    03/04/13  1:14 PM      Result Value Range Status   Specimen Description URINE, RANDOM   Final   Special Requests NONE   Final   Culture  Setup Time     Final   Value: 03/04/2013 14:00     Performed at [facility] Count     Final   Value: NO GROWTH     Performed at [facility]   Culture     Final   Value: NO GROWTH     Performed at [facility]   Report Status 03/05/2013 FINAL   Final  BODY FLUID CULTURE     Status: None   Collection Time    03/07/13  8:32 AM      Result Value Range Status   Specimen Description PLEURAL LEFT FLUID   Final   Special Requests PT ON ZINACEF FLUID   Final   Gram Stain     Final   Value: ABUNDANT WBC PRESENT,BOTH PMN AND MONONUCLEAR     NO ORGANISMS SEEN     Performed at [facility]     Final  Value: NO GROWTH 3 DAYS     Performed at [facility]   Report Status 03/10/2013 FINAL   Final  BODY FLUID CULTURE     Status: None   Collection Time    03/07/13  8:35 AM      Result Value Range Status   Specimen Description PLEURAL LEFT FLUID   Final   Special Requests FLUID ON SWAB PT ON ZINACEF   Final   Gram Stain     Final   Value: NO WBC SEEN     NO ORGANISMS SEEN     Performed at [facility]   Culture     Final   Value: NO GROWTH 3 DAYS     Performed at [facility]   Report Status 03/10/2013 FINAL   Final  ANAEROBIC CULTURE     Status: None   Collection Time    03/07/13  8:35 AM      Result Value Range Status   Specimen Description PLEURAL LEFT FLUID   Final   Special Requests FLUID 0N SWAB PT ON ZINACEF   Final   Gram Stain     Final   Value: RARE WBC PRESENT,BOTH PMN AND MONONUCLEAR     NO ORGANISMS SEEN     Performed at [facility]   Culture     Final   Value: NO ANAEROBES ISOLATED; CULTURE IN PROGRESS FOR 5 DAYS     Performed at [facility]   Report Status PENDING   Incomplete   Assessment: 78  year old man with a history of left effusion/empyema since October, 2014.  Pleural fluid culture on 01/30/13 grew Strep intermedius, sensitive to penicillin.  Chest tube placed on 12/18 and cultures are no growth to date. Zosyn to be continued for now. No fevers noted, wbc normal  Goal of Therapy:  Treat infection  Plan:  Zosyn 3.375g IV q8h (infuse over 4 hours) Monitor renal function and cultures  Sheppard Coil PharmD., BCPS Clinical Pharmacist Pager [phone] 03/11/2013 10:30 AM

## 2013-03-11 NOTE — Evaluation (Signed)
Physical Therapy Evaluation Patient Details Name: [name] MRN: [id number] DOB: 08/22/1934 Today's Date: 03/11/2013 Time: 1478-2956 PT Time Calculation (min): 34 min  PT Assessment / Plan / Recommendation History of Present Illness  78 y/o M admitted 12/18 for planned VATS for loculated L pleural effusion.  Clinical Impression  Pt greatly limited by L knee pain and L UE pain and swelling. Pt currently requiring maxAx2 for all transfers and OOB mobility due to limited functional ability from L UE and LE. Pt was amb on Saturday 12/20 but hasn't been able to these last 2 days. Pt con't to benefit from return to SNF to address mentioned deficits and to achieve safe mod I function prior to returning to home alone.    PT Assessment  Patient needs continued PT services    Follow Up Recommendations  SNF;Supervision/Assistance - 24 hour    Does the patient have the potential to tolerate intense rehabilitation      Barriers to Discharge        Equipment Recommendations       Recommendations for Other Services     Frequency Min 2X/week    Precautions / Restrictions Precautions Precautions: Fall Restrictions Weight Bearing Restrictions: No   Pertinent Vitals/Pain Pt did not rate but c/o of pain in L UE, L knee and chest tube placement      Mobility  Bed Mobility Bed Mobility: Supine to Sit;Sit to Supine Supine to Sit: 2: Max assist;HOB elevated Sit to Supine: 2: Max assist;HOB flat Details for Bed Mobility Assistance: assist for trunk elevation and L LE managment Transfers Transfers: Sit to Stand;Stand to Sit;Stand Pivot Transfers Sit to Stand: 1: +2 Total assist;With upper extremity assist;From chair/3-in-1 Sit to Stand: Patient Percentage: 30% Stand to Sit: 1: +2 Total assist;With upper extremity assist;To bed;To chair/3-in-1 Stand to Sit: Patient Percentage: 30% Stand Pivot Transfers: 1: +2 Total assist Stand Pivot Transfers: Patient Percentage: 40% Details for  Transfer Assistance: multiple attempts to reach standing, pt very cautious/hesitant requiring max verbal cues and tactile cues at hips to achieve full hip ext. max directional v/c's to complete stand pvt transfer. PT returned to assist pt off BSC. Pt with increased difficulty due to transferring to L side (weaker side). assist to advance L LE during transfer. Ambulation/Gait Ambulation/Gait Assistance: Not tested (comment)    Exercises     PT Diagnosis: Difficulty walking;Acute pain  PT Problem List: Decreased strength;Decreased range of motion;Decreased activity tolerance;Decreased balance;Decreased mobility PT Treatment Interventions: DME instruction;Gait training;Stair training;Functional mobility training;Therapeutic activities;Therapeutic exercise     PT Goals(Current goals can be found in the care plan section) Acute Rehab PT Goals Patient Stated Goal: get back to [facility] PT Goal Formulation: With patient Time For Goal Achievement: 03/25/13 Potential to Achieve Goals: Good  Visit Information  Last PT Received On: 03/11/13 Assistance Needed: +2 History of Present Illness: 78 y/o M admitted 12/18 for planned VATS for loculated L pleural effusion.       Prior Functioning  Home Living Family/patient expects to be discharged to:: Skilled nursing facility Living Arrangements: Alone Additional Comments: pt admitted from [facility] Prior Function Level of Independence: Independent with assistive device(s) Comments: pt was using cane at [facility] and attending PT Communication Communication: No difficulties Dominant Hand: Right    Cognition  Cognition Arousal/Alertness: Awake/alert Behavior During Therapy: WFL for tasks assessed/performed Overall Cognitive Status: Within Functional Limits for tasks assessed    Extremity/Trunk Assessment Upper Extremity Assessment Upper Extremity Assessment: LUE deficits/detail LUE Deficits /  Details: noted swelling and pain greatly  limiting function Lower Extremity Assessment Lower Extremity Assessment: LLE deficits/detail (bilat LE edema) LLE Deficits / Details: L knee pain limiting function/ROM/WBing tolerance Cervical / Trunk Assessment Cervical / Trunk Assessment: Normal   Balance    End of Session PT - End of Session Equipment Utilized During Treatment: Gait belt Activity Tolerance: Patient limited by pain;Patient limited by fatigue Patient left: in bed;with call bell/phone within reach;with family/visitor present Nurse Communication: Mobility status  GP     Marcene Brawn 03/11/2013, 4:06 PM  Lewis Shock, PT, DPT Pager #: [phone] Office #: [phone]

## 2013-03-12 ENCOUNTER — Other Ambulatory Visit: Payer: Self-pay | Admitting: *Deleted

## 2013-03-12 ENCOUNTER — Encounter (HOSPITAL_COMMUNITY): Payer: Self-pay | Admitting: Cardiothoracic Surgery

## 2013-03-12 ENCOUNTER — Inpatient Hospital Stay (HOSPITAL_COMMUNITY): Payer: Medicare Other

## 2013-03-12 DIAGNOSIS — J90 Pleural effusion, not elsewhere classified: Secondary | ICD-10-CM

## 2013-03-12 LAB — ANAEROBIC CULTURE

## 2013-03-12 LAB — CBC
HCT: 27.1 % — ABNORMAL LOW (ref 39.0–52.0)
Hemoglobin: 8.5 g/dL — ABNORMAL LOW (ref 13.0–17.0)
MCH: 28.6 pg (ref 26.0–34.0)
MCHC: 31.4 g/dL (ref 30.0–36.0)
MCV: 91.2 fL (ref 78.0–100.0)
RDW: 18.3 % — ABNORMAL HIGH (ref 11.5–15.5)

## 2013-03-12 LAB — BASIC METABOLIC PANEL
BUN: 49 mg/dL — ABNORMAL HIGH (ref 6–23)
CO2: 24 mEq/L (ref 19–32)
Chloride: 107 mEq/L (ref 96–112)
Creatinine, Ser: 2.32 mg/dL — ABNORMAL HIGH (ref 0.50–1.35)
Glucose, Bld: 104 mg/dL — ABNORMAL HIGH (ref 70–99)

## 2013-03-12 MED ORDER — ENOXAPARIN SODIUM 30 MG/0.3ML ~~LOC~~ SOLN
30.0000 mg | SUBCUTANEOUS | Status: DC
  Administered 2013-03-12: 30 mg via SUBCUTANEOUS
  Filled 2013-03-12 (×2): qty 0.3

## 2013-03-12 MED ORDER — ENOXAPARIN SODIUM 30 MG/0.3ML ~~LOC~~ SOLN: 30.0000 mg | Freq: Every day | SUBCUTANEOUS | Status: DC

## 2013-03-12 NOTE — Progress Notes (Signed)
OT Cancellation Note  Patient Details Name: [name] MRN: [id number] DOB: Nov 13, 1934   Cancelled Treatment:    Reason Eval/Treat Not Completed: Other (comment) Pt plans to D/C back to SNF for rehab. Defer OT to SNF. If OT eval needed, please reorder. Thank you. [facility] Adham Johnson, OTR/L  [phone] 03/12/2013 03/12/2013, 11:26 AM

## 2013-03-12 NOTE — Progress Notes (Signed)
Pt tx 2 west per MD order, pt VSS, pt verbalized understanding of tx, report called to receiving RN, all questions answered, updates given at [facility]

## 2013-03-12 NOTE — Progress Notes (Signed)
Speech Language Pathology Treatment: Dysphagia  Patient Details Name: [name] MRN: [id number] DOB: [birth year] Today's Date: 03/12/2013 Time: 1010-1030 SLP Time Calculation (min): 20 min  Assessment / Plan / Recommendation Clinical Impression  Patient seen to address dysphagia goals with focus on direct observation and skilled assessment of patient's toleration of regular solids and thin liquids. Patient exhibited a baseline, congested cough prior to PO's  intermittently during PO intake and after PO intake. Cough did not appear to be related to PO intake and patient's vocal quality remained clear, no change in vital signs. 12/23 CXR: "Persistent left basilar consolidation is noted. .  The right lung remains clear. IMPRESSION: No change in left-sided hydro pneumothorax"      HPI: 78 y/o M admitted 12/18 for planned VATS for loculated L pleural effusion. 12/18, Patient unstable in OT, unable to complete surgical procedure. 12/19, VATS with pleurodesis. Patient with protein calorie malnutrition, SLP consult ordered.    Pertinent Vitals   SLP Plan    Continue with Regular Solids, thin liquids.    Recommendations Diet recommendations: Regular Liquids provided via: Straw;Cup Medication Administration: Whole meds with liquid Supervision: Patient able to self feed;Intermittent supervision to cue for compensatory strategies Compensations: Slow rate;Small sips/bites Postural Changes and/or Swallow Maneuvers: Seated upright 90 degrees;Upright 30-60 min after meal                   GO     Pablo Lawrence 03/12/2013, 12:24 PM  Angela Nevin, MA, CCC-SLP [facility] Speech-Language Pathologist

## 2013-03-12 NOTE — Progress Notes (Signed)
Name: [name] MRN: [id number] DOB: [birth year]    ADMISSION DATE:  03/07/2013 CONSULTATION DATE:  03/07/13  REFERRING MD :  Dr. Morton Peters PRIMARY SERVICE: TCTS  CHIEF COMPLAINT:  Post-Op Respiratory Failure  BRIEF PATIENT DESCRIPTION: 78 y/o M admitted 12/18 for planned VATS for loculated L pleural effusion.  SIGNIFICANT EVENTS / STUDIES:  12/18 - Admitted for VATS for L loculated effusion.  Unstable in OR, unable to complete surgical procedure 12/19 VATS with Pleurodesis per DR VanTight   LINES / TUBES: OETT 12/18>>>12/18 R IJ TLC 12/18>>> Radial aline 12/18>>>12/19 L Chest Tube 12/18 >>  CULTURES: Pleural Fluid 12/18> no org seen >>> BC x 2 12/18  >>>  ANTIBIOTICS: Vanco 12/18>>> Zosyn 12/18>>>12/19  SUBJECTIVE:  Breathing better. Less pain in L hand  VITAL SIGNS: Temp:  [97.6 F (36.4 C)-98.9 F (37.2 C)] 97.6 F (36.4 C) (12/23 0806) Pulse Rate:  [71-92] 88 (12/23 0806) Resp:  [19-25] 23 (12/23 0806) BP: (90-101)/(45-62) 101/62 mmHg (12/23 0806) SpO2:  [92 %-100 %] 92 % (12/23 0806) 02 rx 2 liters [place]  INTAKE / OUTPUT: Intake/Output     12/22 0701 - 12/23 0700 12/23 0701 - 12/24 0700   P.O. 480    I.V. (mL/kg) 180 (2.6)    Total Intake(mL/kg) 660 (9.4)    Urine (mL/kg/hr) 625 (0.4)    Chest Tube 125 (0.1)    Total Output 750     Net -90          Urine Occurrence 5 x      PHYSICAL EXAMINATION: General: No distress, sitting in chair Neuro: alert, normal strength HEENT:  Rt IJ site clean Cardiovascular: regular Lungs: decreased BS Lt base, Lt chest tube in place Abdomen: soft, non tender Musculoskeletal:1-2 + pitting Bilateral LE edema  LUE edema 2+ Acute gouty flare >>better 12/23  Skin: no rashes  LABS:  CBC  Recent Labs Lab 03/09/13 0320 03/10/13 0930 03/12/13 0354  WBC 5.1 6.7 8.2  HGB 7.7* 8.3* 8.5*  HCT 24.5* 26.6* 27.1*  PLT 129* 152 179    Coag's No results found for this basename: APTT, INR,  in the last 168  hours BMET  Recent Labs Lab 03/09/13 0320 03/10/13 0930 03/12/13 0354  NA 138 138 138  K 5.6* 5.4* 5.6*  CL 108 107 107  CO2 25 24 24   BUN 40* 36* 49*  CREATININE 2.17* 1.99* 2.32*  GLUCOSE 78 143* 104*    Electrolytes  Recent Labs Lab 03/08/13 0410 03/09/13 0320 03/10/13 0930 03/12/13 0354  CALCIUM 8.1* 7.6* 7.8* 8.1*  MG 1.9  --   --   --   PHOS 4.4  --   --   --    ABG  Recent Labs Lab 03/07/13 1115 03/07/13 1304 03/08/13 0415  PHART 7.309* 7.288* 7.280*  PCO2ART 43.5 44.7 50.4*  PO2ART 104.0* 79.0* 159.0*   Liver Enzymes  Recent Labs Lab 03/09/13 0320  AST 8  ALT 7  ALKPHOS 58  BILITOT 0.3  ALBUMIN 1.8*   Cardiac Enzymes  Recent Labs Lab 03/07/13 0950 03/07/13 1500 03/07/13 2145  TROPONINI <0.30 <0.30 <0.30   Glucose  Recent Labs Lab 03/08/13 1328 03/08/13 1758 03/08/13 2036 03/08/13 2349 03/09/13 0309 03/09/13 0759  GLUCAP 82 96 96 116* 83 83    Imaging Dg Chest 2 View  03/12/2013   CLINICAL DATA:  Shortness of breath check chest tube  EXAM: CHEST  2 VIEW  COMPARISON:  03/11/2013  FINDINGS: Right-sided central  venous line again is noted within the mid superior vena cava. The cardiac shadow is enlarged but stable. A hydro pneumothorax is noted within the pleural space on the left and unchanged. Persistent left basilar consolidation is noted. . A left-sided chest tube is again identified in stable position. The right lung remains clear.  IMPRESSION: No change in left-sided hydro pneumothorax.   Electronically Signed   By: Alcide Clever M.D.   On: 03/12/2013 08:07   Dg Chest Port 1 View  03/11/2013   CLINICAL DATA:  VATS  EXAM: PORTABLE CHEST - 1 VIEW  COMPARISON:  03/10/2013  FINDINGS: Right jugular central venous catheter remains in place with tip overlying the lower SVC. Left-sided chest tube is unchanged. The heart remains enlarged. Loculated left basilar hydropneumothorax does not appear significantly changed. There may be a tiny  left apical pneumothorax as well. Left basilar airspace opacity does not appear significantly changed. No right-sided airspace consolidation is identified.  IMPRESSION: No significant interval change in the appearance of left basilar hydropneumothorax and left basilar airspace opacity.   Electronically Signed   By: Sebastian Ache   On: 03/11/2013 07:58    ASSESSMENT / PLAN: A:   L Empyema >> pleural fluid cx from 01/30/13 with Streptococcus intermedius. P:   -chest tube per TCTS   - Zosyn per dashboard    A: Acute Respiratory Failure prior to Lt VATS 12/18 >> resolved. P:   -oxygen to keep SpO2 > 92%  A:  Hx of ischemic cardiomyopathy, A fib, Mitral regurgitation, Aortic regurgitation. Hypotension prior to VATS 12/18; likely related to meds >> resolved. afib RVR RUE Edema>>DVT RUE on doppler RUE P:  -monitor hemodynamics -cont  metoprolol       A:   CKD stage III - baseline cr 2.3. Hx of BPH. Lab Results  Component Value Date   CREATININE 2.32* 03/12/2013   CREATININE 1.99* 03/10/2013   CREATININE 2.17* 03/09/2013    P:   -monitor renal fx, urine outpt, electrolytes -resumed flomax 12/19   A:   Protein calorie malnutrition. P:   -regular diet Assess swallow  A:   Chronic Anemia. Hx Non-Hodgkin's Lymphoma.  Recent Labs Lab 03/09/13 0320 03/10/13 0930 03/12/13 0354  HGB 7.7* 8.3* 8.5*    P:  -f/u CBC on lovenox  Full dose lovenox   MUSCULOSKELETAL A:   Hx of Gout.  With acute flare P:   -colchicine Rx       Caryl Bis  [phone]  Cell  [phone]  If no response or cell goes to voicemail, call beeper [phone]

## 2013-03-12 NOTE — Discharge Summary (Signed)
[address]       [place] [address]             [phone]       [name] 1934/09/06 78 y.o. [id number]  03/07/2013   Coralyn Helling, MD  LOCULATED LEFT PLEURAL EFFUSION   HPI:  This is a 78 y.o. male black male who presented for evaluation of chronic loculated left pleural effusion. The patient was admitted at [facility] 2 weeks in November for a left lower lobe pneumonia with empyema, treated with thoracentesis and chest tube drainage. Fluid cultures were positive for streptococcus. The patient was recommended left VAT and decortication but refused and was discharged to [facility]. He finished a course of Augmentin. Over time he developed increasing shortness of breath with minimal walking when doing physical therapy. However, his overall strength has improved as well as his oral intake. A chest x-ray was done 2 days prior to admission and showed a large left pleural effusion. He decided at that time to proceed with surgery due to feeling so poorly. At that time he did deny productive cough, fever, hemoptysis, or left chest wall pain. Of note, his medical history is significant for heart failure with ejection fraction of 20%, mild to moderate mitral regurgitation, and also chronic renal insufficiency with a creatinine of 2.3. He was reevaluated by Dr.Van Maudie Flakes and admitted this hospitalization for the Vats/decortication.  Past Medical History   Diagnosis  Date   .  Aortic insufficiency      a. Previously mod-severe; b. 03/2012 Echo: Mild to Mod AI   .  Atrial flutter      resolved   .  Anemia    .  PVCs (premature ventricular contractions)    .  Chronic systolic CHF (congestive heart failure)      a. 03/2012 Echo: EF 15%, Mild to Mod AI, Mod MR   .  Bilateral hydronephrosis      a. 03/2012 Renal u/s: Bilat Severe hydronephrosis, medical renal dzs.   .  CKD (chronic kidney disease), stage III    .  Non Hodgkin's lymphoma      resolved >10  yr ago   .  Moderate mitral regurgitation      a. 03/2012 Echo: Mod MR   .  BPH (benign prostatic hyperplasia)      a. noted on renal u/s 03/2012.   .  Ischemic cardiomyopathy      a. Lexiscan Myoview 04/24/12: Inferior apical MI, study not gated due to frequent PVCs, triplets => patient refuses cath   .  Gout    .  Cardiomyopathy, ischemic  04/24/2012   .  Acute systolic CHF (congestive heart failure)  04/17/2012   .  Atrial fibrillation  02/02/2013   .  CAD (coronary artery disease)  04/24/2012   .  Chronic systolic heart failure  04/24/2012   .  Acute respiratory failure  01/31/2013   .  CAP (community acquired pneumonia)  01/30/2013   .  Pleural effusion, left  01/29/2013   .  Empyema  01/30/2013   .  Protein-calorie malnutrition, severe  01/31/2013    Past Surgical History   Procedure  Laterality  Date   .  Lymph node dissection       Groin   .  Inguinal hernia repair      Family History   Problem  Relation  Age of Onset   .  Prostate cancer  Father     Social History  History   Substance Use Topics   .  Smoking status:  Never Smoker   .  Smokeless tobacco:  Never Used   .  Alcohol Use:  No    Current Outpatient Prescriptions   Medication  Sig  Dispense  Refill   .  docusate sodium (COLACE) 100 MG capsule  Take 100 mg by mouth 2 (two) times daily.     .  feeding supplement, ENSURE COMPLETE, (ENSURE COMPLETE) LIQD  Take 237 mLs by mouth daily after supper.     .  ferrous sulfate 325 (65 FE) MG tablet  Take 325 mg by mouth 2 (two) times daily with a meal.     .  furosemide (LASIX) 40 MG tablet  Take 40 mg by mouth daily as needed for fluid (for weight gain of 3 pounds or more).     Marland Kitchen  HYDROcodone-acetaminophen (NORCO/VICODIN) 5-325 MG per tablet  Take 1 tablet by mouth every 4 hours as needed for mild pain *DO NOT EXCEED 4 GM OF TYLENOL IN 24 HOURS*; Take 2 tablets by mouth every 4 hours as needed for moderate to severe pain  180 tablet  0   .  nitroGLYCERIN (NITROSTAT) 0.4 MG SL  tablet  Place 1 tablet (0.4 mg total) under the tongue every 5 (five) minutes x 3 doses as needed for chest pain.  25 tablet  3   .  saccharomyces boulardii (FLORASTOR) 250 MG capsule  Take 250 mg by mouth 2 (two) times daily.     .  tamsulosin (FLOMAX) 0.4 MG CAPS capsule  Take 1 capsule (0.4 mg total) by mouth daily after supper.  30 capsule     No current facility-administered medications for this visit.    Allergies   Allergen  Reactions   .  Contrast Media [Iodinated Diagnostic Agents]  Other (See Comments)     Patient does not want dye because it is hard on his kidneys   .  Prednisone  Rash   .  Shellfish Allergy  Itching and Rash     Crab meat       Hospital Course:  The patient was admitted and taken to the operating room on 03/07/2013 at which time he underwent the following procedure:  DATE OF PROCEDURE: 03/07/2013  DATE OF DISCHARGE:  OPERATIVE REPORT  OPERATIONS:  1. Placement of left chest tube for drainage of loculated pleural  effusion.  2. Powdered talc insufflation of left pleural space.  SURGEON: Kerin Perna, MD  PREOPERATIVE DIAGNOSES: Recurrent loculated left pleural effusion,  history of left lower lobe pneumonia, chronic renal failure, congestive  heart failure.  POSTOPERATIVE DIAGNOSES: Recurrent loculated left pleural effusion,  history of left lower lobe pneumonia, chronic renal failure, congestive  heart failure. OPERATIVE FINDINGS:  1. Severe hypotension on induction of anesthesia, requiring high-dose  pressors and volume to restore blood pressure.  2. Decision was made not to proceed with formal VATS, but to  expeditiously insert a large bore chest tube to drain the loculated  effusion and to perform a talc pleurodesis.  3. The patient tolerated the procedure well and was returned to the  ICU, still intubated, but hemodynamically stable.  4. 350 mL of turbid fluid was drained from the left pleural space and  sent for studies.  Due to the  intraoperative difficulties critical care medicine consultation was obtained to assist with management of this patient.  Postoperative hospital course: The patient  was continued on a course of Zosyn intravenously during the postoperative period due to the Streptococcus intermedius infection. The acute respiratory failure resolved over time. The patient did develop during the postoperative period a left upper arm deep venous thrombosis and has been treated with Lovenox. Plan at discharge is to continue Lovenox at DVT prophylaxis level. Renal function shows most recent creatinine 2.43  and a BUN of 57. The patient did have a postoperative swallowing study by speech therapy and he is felt to be low risk for aspiration. His laboratory values are stable in regards to his hemoglobin and hematocrit. He does not have a leukocytosis. The current plan of the chest tube is to continue at nursing facility on a mini express with plans to manage in the office. Drainage should be recorded by the facility. At discharge, he will also have a 10 day course of oral Augmentin. He is felt to be stable to transfer back to the nursing facility on today's date.    Recent Labs  03/10/13 0930 03/12/13 0354  NA 138 138  K 5.4* 5.6*  CL 107 107  CO2 24 24  GLUCOSE 143* 104*  BUN 36* 49*  CALCIUM 7.8* 8.1*    Recent Labs  03/10/13 0930 03/12/13 0354  WBC 6.7 8.2  HGB 8.3* 8.5*  HCT 26.6* 27.1*  PLT 152 179   No results found for this basename: INR,  in the last 72 hours   Discharge Instructions:  The patient is discharged to home with extensive instructions on wound care and progressive ambulation.  They are instructed not to drive or perform any heavy lifting until returning to see the physician in his office.  Discharge Diagnosis:  LOCULATED LEFT PLEURAL EFFUSION  Secondary Diagnosis: Patient Active Problem List   Diagnosis Date Noted  . DVT of left axillary vein, acute 03/11/2013  . Empyema of left  pleural space 03/07/2013  . Atrial fibrillation 02/02/2013  . Acute respiratory failure 01/31/2013  . 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 Protein-calorie malnutrition, severe 01/31/2013  . CAP (community acquired pneumonia) 01/30/2013  . Empyema 01/30/2013  . Pleural effusion, left 01/29/2013  . Cardiomyopathy, ischemic 04/24/2012  . CAD (coronary artery disease) 04/24/2012  . Chronic systolic heart failure 04/24/2012  . Acute systolic CHF (congestive heart failure) 04/17/2012  . Moderate mitral regurgitation 04/17/2012  . Bilateral hydronephrosis   . Aortic insufficiency 02/22/2012  . PVCs (premature ventricular contractions)   . CKD (chronic kidney disease), stage III    Past Medical History  Diagnosis Date  . Aortic insufficiency     a. Previously mod-severe;  b. 03/2012 Echo: Mild to Mod AI  . Atrial flutter     resolved  . Anemia   . PVCs (premature ventricular contractions)   . Chronic systolic CHF (congestive heart failure)     a.  03/2012 Echo: EF 15%, Mild to Mod AI, Mod MR  . Bilateral hydronephrosis     a. 03/2012 Renal u/s: Bilat Severe hydronephrosis, medical renal dzs.  . CKD (chronic kidney disease), stage III   . Non Hodgkin's lymphoma     resolved >10 yr ago  . Moderate mitral regurgitation     a.  03/2012 Echo: Mod MR  . BPH (benign prostatic hyperplasia)     a. noted on renal u/s 03/2012.  . Ischemic cardiomyopathy     a.  Lexiscan Myoview 04/24/12: Inferior apical MI, study not gated due to frequent PVCs, triplets => patient refuses cath  . Gout   .  Cardiomyopathy, ischemic 04/24/2012  . Acute systolic CHF (congestive heart failure) 04/17/2012  . Atrial fibrillation 02/02/2013  . CAD (coronary artery disease) 04/24/2012  . Chronic systolic heart failure 04/24/2012  . Acute respiratory failure 01/31/2013  . CAP (community acquired pneumonia) 01/30/2013  . Pleural effusion, left 01/29/2013  . Empyema 01/30/2013  . Protein-calorie malnutrition, severe 01/31/2013  . Gait  abnormality     Hx: of  . Mitral valve insufficiency     Hx: of   . Aortic valve stenosis     Hx: of  . Paroxysmal supraventricular tachycardia     Hx: of  . Shortness of breath     Medications at discharge:   Medication List         amoxicillin-clavulanate 875-125 MG per tablet  Commonly known as:  AUGMENTIN  Take 1 tablet by mouth 2 (two) times daily. For 10 days     docusate sodium 100 MG capsule  Commonly known as:  COLACE  Take 100 mg by mouth 2 (two) times daily.     enoxaparin 30 MG/0.3ML injection  Commonly known as:  LOVENOX  Inject 0.3 mLs (30 mg total) into the skin daily.     feeding supplement (ENSURE COMPLETE) Liqd  Take 237 mLs by mouth daily after supper.     ferrous sulfate 325 (65 FE) MG tablet  Take 325 mg by mouth 2 (two) times daily with a meal.     furosemide 40 MG tablet  Commonly known as:  LASIX  Take 40 mg by mouth daily as needed for fluid (for weight gain of 3 pounds or more).     HYDROcodone-acetaminophen 5-325 MG per tablet  Commonly known as:  NORCO/VICODIN  Take 1 tablet by mouth every 4 hours as needed for mild pain *DO NOT EXCEED 4 GM OF TYLENOL IN 24 HOURS*; Take 2 tablets by mouth every 4 hours as needed for moderate to severe pain     metoprolol tartrate 25 MG tablet  Commonly known as:  LOPRESSOR  Take 1 tablet (25 mg total) by mouth 2 (two) times daily.     nitroGLYCERIN 0.4 MG SL tablet  Commonly known as:  NITROSTAT  Place 1 tablet (0.4 mg total) under the tongue every 5 (five) minutes x 3 doses as needed for chest pain.     saccharomyces boulardii 250 MG capsule  Commonly known as:  FLORASTOR  Take 250 mg by mouth 2 (two) times daily.     tamsulosin 0.4 MG Caps capsule  Commonly known as:  FLOMAX  Take 1 capsule (0.4 mg total) by mouth daily after supper.     traMADol 50 MG tablet  Commonly known as:  ULTRAM  Take 1 tablet (50 mg total) by mouth every 6 (six) hours as needed for moderate pain.         Disposition: SNF   Gershon Crane, PA-C 03/12/2013  12:20 PM

## 2013-03-12 NOTE — Progress Notes (Signed)
Physical Therapy Treatment Patient Details Name: [name] MRN: [id number] DOB: 09/13/1934 Today's Date: 03/12/2013 Time: 4098-1191 PT Time Calculation (min): 24 min  PT Assessment / Plan / Recommendation  History of Present Illness 78 y/o M admitted 12/18 for planned VATS for loculated L pleural effusion.   PT Comments   Pt with improved ability to move L UE and LE however remains extremely weak and unable to use L UE functionally due to inability to WB thru hand due to onset of pain. Pt remains unable to amb this date due to inability to move L LE or achieve full upright position. Pt con't to be appropriate for SNF upon d/c to maximize functional recovery.   Follow Up Recommendations  SNF;Supervision/Assistance - 24 hour     Does the patient have the potential to tolerate intense rehabilitation     Barriers to Discharge        Equipment Recommendations  None recommended by PT    Recommendations for Other Services    Frequency     Progress towards PT Goals Progress towards PT goals: Progressing toward goals  Plan Current plan remains appropriate    Precautions / Restrictions Precautions Precautions: Fall Restrictions Weight Bearing Restrictions: No   Pertinent Vitals/Pain 6/10 L UE pain with weight-bearing,    Mobility  Bed Mobility Bed Mobility: Supine to Sit Supine to Sit: 2: Max assist;HOB elevated Details for Bed Mobility Assistance: assist for trunk elevation and L LE managment Transfers Transfers: Sit to Stand;Stand to Sit;Stand Pivot Transfers Sit to Stand: 1: +2 Total assist;With upper extremity assist;From chair/3-in-1 Sit to Stand: Patient Percentage: 30% Stand to Sit: 1: +2 Total assist;With upper extremity assist;To bed;To chair/3-in-1 Stand to Sit: Patient Percentage: 30% Stand Pivot Transfers: 1: +2 Total assist Stand Pivot Transfers: Patient Percentage: 30% Details for Transfer Assistance: pt con't to be unable to achieve full hip/knee extension.  pt able to step with R LE however requires assist for L LE advancement Ambulation/Gait Ambulation/Gait Assistance: Not tested (comment)    Exercises General Exercises - Lower Extremity Quad Sets: PROM;Both;10 reps;Supine Gluteal Sets: AROM;Both;15 reps Long Arc Quad: AROM;Both;15 reps;Seated Hip ABduction/ADduction: AROM;Both;15 reps;Seated Heel Raises: AROM;Both;20 reps;Seated   PT Diagnosis:    PT Problem List:   PT Treatment Interventions:     PT Goals (current goals can now be found in the care plan section)    Visit Information  Last PT Received On: 03/12/13 Assistance Needed: +2 History of Present Illness: 78 y/o M admitted 12/18 for planned VATS for loculated L pleural effusion.    Subjective Data      Cognition  Cognition Arousal/Alertness: Awake/alert Behavior During Therapy: WFL for tasks assessed/performed Overall Cognitive Status: Within Functional Limits for tasks assessed    Balance     End of Session PT - End of Session Equipment Utilized During Treatment: Gait belt Activity Tolerance: Patient limited by pain;Patient limited by fatigue Patient left: in chair;with call bell/phone within reach Nurse Communication: Mobility status   GP     Marcene Brawn 03/12/2013, 10:20 AM  Lewis Shock, PT, DPT Pager #: [phone] Office #: [phone]

## 2013-03-12 NOTE — Progress Notes (Signed)
Pt R IJ d/c per MD order, pt tol well, pt educated to call nursing if SOB, dizzy or any changes from baseline

## 2013-03-12 NOTE — Progress Notes (Addendum)
TCTS DAILY ICU PROGRESS NOTE                   [address]            [place] [address]          [phone]   5 Days Post-Op Procedure(s) (LRB): CHEST TUBE INSERTION (Left) TALC PLEURADESIS (Left)  Total Length of Stay:  LOS: 5 days   Subjective: Says breathing is reasonably comfortable, + productive cough but less so  Objective: Vital signs in last 24 hours: Temp:  [97.7 F (36.5 C)-98.9 F (37.2 C)] 98.9 F (37.2 C) (12/23 0418) Pulse Rate:  [71-92] 83 (12/23 0418) Cardiac Rhythm:  [-] Normal sinus rhythm;Other (Comment) (12/22 2000) Resp:  [19-25] 23 (12/23 0418) BP: (90-92)/(45-60) 92/57 mmHg (12/23 0418) SpO2:  [94 %-100 %] 97 % (12/23 0418)  Filed Weights   03/07/13 1800 03/08/13 0625  Weight: 154 lb 8 oz (70.081 kg) 154 lb 9.6 oz (70.126 kg)    Weight change:    Hemodynamic parameters for last 24 hours:    Intake/Output from previous day: 12/22 0701 - 12/23 0700 In: 660 [P.O.:480; I.V.:180] Out: 750 [Urine:625; Chest Tube:125]  Intake/Output this shift:    Current Meds: Scheduled Meds: . bisacodyl  10 mg Oral Daily  . colchicine  0.6 mg Oral BID  . enoxaparin (LOVENOX) injection  1 mg/kg Subcutaneous q1800  . metoprolol tartrate  25 mg Oral BID  . piperacillin-tazobactam (ZOSYN)  IV  3.375 g Intravenous Q8H  . saccharomyces boulardii  250 mg Oral BID   Continuous Infusions: . dextrose 5 % and 0.45% NaCl 20 mL/hr at 03/12/13 0400   PRN Meds:.fentaNYL, levalbuterol, ondansetron (ZOFRAN) IV, potassium chloride, traMADol  General appearance: alert, cooperative and no distress Heart: regular rate and rhythm and occ extrasystole(PVC's) Lungs: coarse ronchi throughout Abdomen: soft, non-tender Extremities: L>R LE edema  Lab Results: CBC: Recent Labs  03/10/13 0930 03/12/13 0354  WBC 6.7 8.2  HGB 8.3* 8.5*  HCT 26.6* 27.1*  PLT 152 179   BMET:  Recent Labs  03/10/13 0930 03/12/13 0354  NA 138 138  K 5.4* 5.6*  CL  107 107  CO2 24 24  GLUCOSE 143* 104*  BUN 36* 49*  CREATININE 1.99* 2.32*  CALCIUM 7.8* 8.1*    PT/INR: No results found for this basename: LABPROT, INR,  in the last 72 hours Radiology: Dg Chest Port 1 View  03/11/2013   CLINICAL DATA:  VATS  EXAM: PORTABLE CHEST - 1 VIEW  COMPARISON:  03/10/2013  FINDINGS: Right jugular central venous catheter remains in place with tip overlying the lower SVC. Left-sided chest tube is unchanged. The heart remains enlarged. Loculated left basilar hydropneumothorax does not appear significantly changed. There may be a tiny left apical pneumothorax as well. Left basilar airspace opacity does not appear significantly changed. No right-sided airspace consolidation is identified.  IMPRESSION: No significant interval change in the appearance of left basilar hydropneumothorax and left basilar airspace opacity.   Electronically Signed   By: Sebastian Ache   On: 03/11/2013 07:58     Assessment/Plan: S/P Procedure(s) (LRB): CHEST TUBE INSERTION (Left) TALC PLEURADESIS (Left)  1 chest tube now mini-express, serosang drainage, 125 cc recorded yesterday- cont tube 2 mild asp risk per ST 3 now fll dose lovenox for DVT- will need long term plan 4 cont IV abx - aggressive pulm rx 5 ? Timing of return to SNF 6 creat up further - limit  nephrotoxic drugs as able 7 push rehab as able GOLD,WAYNE E 03/12/2013 8:04 AM   Patient examined and CXR reviewed Expect patient will be ready for return to SNF tomorrow from surgical perspective- repeat creatinine in am Reduce Lovenox to DVT prophylaxis at DC Nurses will need to empty Mini-express daily  patient examined and medical record reviewed,agree with above note. VAN TRIGT III,[place] 03/12/2013

## 2013-03-13 ENCOUNTER — Inpatient Hospital Stay (HOSPITAL_COMMUNITY): Payer: Medicare Other

## 2013-03-13 LAB — TYPE AND SCREEN
ABO/RH(D): A POS
Antibody Screen: NEGATIVE
Unit division: 0
Unit division: 0

## 2013-03-13 LAB — BASIC METABOLIC PANEL
BUN: 57 mg/dL — ABNORMAL HIGH (ref 6–23)
CO2: 23 mEq/L (ref 19–32)
Creatinine, Ser: 2.43 mg/dL — ABNORMAL HIGH (ref 0.50–1.35)
GFR calc Af Amer: 28 mL/min — ABNORMAL LOW (ref >90)
GFR calc non Af Amer: 24 mL/min — ABNORMAL LOW (ref >90)
Glucose, Bld: 100 mg/dL — ABNORMAL HIGH (ref 70–99)
Sodium: 138 mEq/L (ref 135–145)

## 2013-03-13 MED ORDER — METOPROLOL TARTRATE 25 MG PO TABS: 25.0000 mg | ORAL_TABLET | Freq: Two times a day (BID) | ORAL | Status: AC

## 2013-03-13 MED ORDER — AMOXICILLIN-POT CLAVULANATE 875-125 MG PO TABS: 1.0000 | ORAL_TABLET | Freq: Two times a day (BID) | ORAL | Status: DC

## 2013-03-13 MED ORDER — TRAMADOL HCL 50 MG PO TABS: 50.0000 mg | ORAL_TABLET | Freq: Four times a day (QID) | ORAL | Status: AC | PRN

## 2013-03-13 MED ORDER — ENOXAPARIN SODIUM 30 MG/0.3ML ~~LOC~~ SOLN: 30.0000 mg | SUBCUTANEOUS | Status: AC

## 2013-03-13 NOTE — Progress Notes (Signed)
Patient to be discharged to SNF, will be transported via ems, copied chart and all paperwork sent with Patient. [name], Randall An RN

## 2013-03-13 NOTE — Progress Notes (Addendum)
03/13/13 1210pm Report called to [place] facility\ spoke with  [facility] LPN, Artia Singley, [employer]

## 2013-03-13 NOTE — Progress Notes (Signed)
       [address]       [place] [address]             [phone]          6 Days Post-Op Procedure(s) (LRB): CHEST TUBE INSERTION (Left) TALC PLEURADESIS (Left)  Subjective: Comfortable, breathing stable.  No complaints.   Objective: Vital signs in last 24 hours: Patient Vitals for the past 24 hrs:  BP Temp Temp src Pulse Resp SpO2  03/13/13 0429 89/54 mmHg 98.3 F (36.8 C) Oral 76 18 100 %  03/12/13 2212 90/62 mmHg - - - - -  03/12/13 2014 86/50 mmHg 98.2 F (36.8 C) Oral 64 18 100 %  03/12/13 1700 94/58 mmHg - - 77 18 -  03/12/13 1500 82/48 mmHg 97.9 F (36.6 C) Oral 75 18 100 %  03/12/13 1249 85/57 mmHg - - 40 18 100 %  03/12/13 1200 - 97.6 F (36.4 C) Oral - - -   Current Weight  03/08/13 154 lb 9.6 oz (70.126 kg)     Intake/Output from previous day: 12/23 0701 - 12/24 0700 In: 1290 [P.O.:840; IV Piggyback:450] Out: 1201 [Urine:1100; Stool:1; Chest Tube:100]    PHYSICAL EXAM:  Heart: RRR Lungs: Coarse rhonchi bilaterally Wound: Clean and dry    Lab Results: CBC: Recent Labs  03/10/13 0930 03/12/13 0354  WBC 6.7 8.2  HGB 8.3* 8.5*  HCT 26.6* 27.1*  PLT 152 179   BMET:  Recent Labs  03/12/13 0354 03/13/13 0452  NA 138 138  K 5.6* 4.7  CL 107 107  CO2 24 23  GLUCOSE 104* 100*  BUN 49* 57*  CREATININE 2.32* 2.43*  CALCIUM 8.1* 7.7*    PT/INR: No results found for this basename: LABPROT, INR,  in the last 72 hours  CXR: FINDINGS:  The cardiac silhouette is enlarged. The patient has right-sided  central venous catheter has been removed in the interim. A stable  hydro pneumothorax projects within the left lung base. Chest tubes  also identified within the left lung base unchanged in position. The  right lung is clear. Persistent density projects in the left lung  base which is increased in conspicuity when compared to the previous  study.  IMPRESSION:  Stable left hydro pneumothorax and positioning of the  left chest  tube. Increased conspicuity of the left basilar density likely  reflecting increased atelectasis. An infiltrate and clinically  appropriate cannot be excluded   Assessment/Plan: S/P Procedure(s) (LRB): CHEST TUBE INSERTION (Left) TALC PLEURADESIS (Left) CT output remains around 100 ml/24 hrs serous fluid.  Continue CT to Mini-Express. Cr stable at baseline.   Plan d/c to SNF today.   LOS: 6 days    [name] 03/13/2013

## 2013-03-13 NOTE — Progress Notes (Signed)
Clinical Social Worker facilitated patient discharge by contacting the patient and facility, [facility]. Patient agreeable to this plan and arranging transport via EMS . CSW will sign off, as social work intervention is no longer needed.  Maree Krabbe, MSW, Theresia Majors [phone]

## 2013-03-13 NOTE — Progress Notes (Signed)
03/13/13 Nursing note Pt BP low 88/56, Coral Ceo PAC made aware, OK to not give Metoprolol  Po, will continue to monitor patient.[name], [employer]

## 2013-03-17 NOTE — Discharge Summary (Signed)
patient examined and medical record reviewed,agree with above note. VAN TRIGT III,PETER 03/17/2013

## 2013-03-18 ENCOUNTER — Other Ambulatory Visit: Payer: Self-pay | Admitting: *Deleted

## 2013-03-18 MED ORDER — HYDROCODONE-ACETAMINOPHEN 5-325 MG PO TABS: ORAL_TABLET | ORAL | Status: DC

## 2013-03-19 ENCOUNTER — Other Ambulatory Visit: Payer: Self-pay | Admitting: *Deleted

## 2013-03-20 ENCOUNTER — Encounter: Payer: Self-pay | Admitting: Cardiothoracic Surgery

## 2013-03-20 ENCOUNTER — Ambulatory Visit (INDEPENDENT_AMBULATORY_CARE_PROVIDER_SITE_OTHER): Payer: Medicare Other | Admitting: Cardiothoracic Surgery

## 2013-03-20 ENCOUNTER — Ambulatory Visit
Admission: RE | Admit: 2013-03-20 | Discharge: 2013-03-20 | Disposition: A | Discharge status: Home / Self Care | Payer: Medicare Other | Source: Ambulatory Visit | Attending: Cardiothoracic Surgery | Admitting: Cardiothoracic Surgery

## 2013-03-20 VITALS — BP 97/60 | HR 57 | Resp 16 | Ht 72.0 in | Wt 153.0 lb

## 2013-03-20 DIAGNOSIS — J90 Pleural effusion, not elsewhere classified: Secondary | ICD-10-CM

## 2013-03-20 DIAGNOSIS — Z09 Encounter for follow-up examination after completed treatment for conditions other than malignant neoplasm: Secondary | ICD-10-CM

## 2013-03-20 NOTE — Progress Notes (Signed)
PCP is ROBERTS, Vernie Ammons, MD Referring Provider is Coralyn Helling, MD  Chief Complaint  Patient presents with  . Routine Post Op    s/p drainage of L pl fluid, talc pleurodesis and insertion of chest tube 03/07/13...with a cxr     HPI: 78 year old returns for office visit followup status post left empyema tube placement. Planned VATS-decortication was aborted do to severe hypotension on induction of general anesthesia. He recovered and returned to his nursing home with a mini express tube drainage system. He is on oral Augmentin. His pleural fluid cultures from the OR was negative. He is on room air and he is wheelchair-bound. He is draining clear fluid 250-300 cc per day.  Chest x-ray shows reduction in pleural space-cavity at the left base, tube in good position  Past Medical History  Diagnosis Date  . Aortic insufficiency     a. Previously mod-severe;  b. 03/2012 Echo: Mild to Mod AI  . Atrial flutter     resolved  . Anemia   . PVCs (premature ventricular contractions)   . Chronic systolic CHF (congestive heart failure)     a.  03/2012 Echo: EF 15%, Mild to Mod AI, Mod MR  . Bilateral hydronephrosis     a. 03/2012 Renal u/s: Bilat Severe hydronephrosis, medical renal dzs.  . CKD (chronic kidney disease), stage III   . Non Hodgkin's lymphoma     resolved >10 yr ago  . Moderate mitral regurgitation     a.  03/2012 Echo: Mod MR  . BPH (benign prostatic hyperplasia)     a. noted on renal u/s 03/2012.  . Ischemic cardiomyopathy     a.  Lexiscan Myoview 04/24/12: Inferior apical MI, study not gated due to frequent PVCs, triplets => patient refuses cath  . Gout   . Cardiomyopathy, ischemic 04/24/2012  . Acute systolic CHF (congestive heart failure) 04/17/2012  . Atrial fibrillation 02/02/2013  . CAD (coronary artery disease) 04/24/2012  . Chronic systolic heart failure 04/24/2012  . Acute respiratory failure 01/31/2013  . CAP (community acquired pneumonia) 01/30/2013  . Pleural effusion,  left 01/29/2013  . Empyema 01/30/2013  . Protein-calorie malnutrition, severe 01/31/2013  . Gait abnormality     Hx: of  . Mitral valve insufficiency     Hx: of   . Aortic valve stenosis     Hx: of  . Paroxysmal supraventricular tachycardia     Hx: of  . Shortness of breath     Past Surgical History  Procedure Laterality Date  . Lymph node dissection      Groin  . Inguinal hernia repair    . Colonoscopy w/ biopsies and polypectomy      Hx: of   . Tonsillectomy    . Chest tube insertion  03/07/2013     PLANNED VATS           DR VANTRIGT  . Chest tube insertion Left 03/07/2013    Procedure: CHEST TUBE INSERTION;  Surgeon: Kerin Perna, MD;  Location: [facility] OR;  Service: Thoracic;  Laterality: Left;  . Talc pleurodesis Left 03/07/2013    Procedure: Lurlean Nanny;  Surgeon: Kerin Perna, MD;  Location: [facility] OR;  Service: Thoracic;  Laterality: Left;    Family History  Problem Relation Age of Onset  . Prostate cancer Father     Social History History  Substance Use Topics  . Smoking status: Never Smoker   . Smokeless tobacco: Never Used  . Alcohol Use: No  Current Outpatient Prescriptions  Medication Sig Dispense Refill  . amoxicillin-clavulanate (AUGMENTIN) 875-125 MG per tablet Take 1 tablet by mouth 2 (two) times daily. For 10 days  20 tablet  0  . docusate sodium (COLACE) 100 MG capsule Take 100 mg by mouth 2 (two) times daily.      Marland Kitchen enoxaparin (LOVENOX) 30 MG/0.3ML injection Inject 0.3 mLs (30 mg total) into the skin daily.  0 Syringe    . feeding supplement, ENSURE COMPLETE, (ENSURE COMPLETE) LIQD Take 237 mLs by mouth daily after supper.      . ferrous sulfate 325 (65 FE) MG tablet Take 325 mg by mouth 2 (two) times daily with a meal.      . furosemide (LASIX) 40 MG tablet Take 40 mg by mouth daily as needed for fluid (for weight gain of 3 pounds or more).       Marland Kitchen HYDROcodone-acetaminophen (NORCO/VICODIN) 5-325 MG per tablet Take one tablet by mouth every 4  hours as needed for mild pain; Take two tablets by mouth every 4 hours as needed for moderate to severe pain  360 tablet  0  . metoprolol tartrate (LOPRESSOR) 25 MG tablet Take 1 tablet (25 mg total) by mouth 2 (two) times daily.      . nitroGLYCERIN (NITROSTAT) 0.4 MG SL tablet Place 1 tablet (0.4 mg total) under the tongue every 5 (five) minutes x 3 doses as needed for chest pain.  25 tablet  3  . saccharomyces boulardii (FLORASTOR) 250 MG capsule Take 250 mg by mouth 2 (two) times daily.      . tamsulosin (FLOMAX) 0.4 MG CAPS capsule Take 1 capsule (0.4 mg total) by mouth daily after supper.  30 capsule    . traMADol (ULTRAM) 50 MG tablet Take 1 tablet (50 mg total) by mouth every 6 (six) hours as needed for moderate pain.  30 tablet  0   No current facility-administered medications for this visit.    Allergies  Allergen Reactions  . Contrast Media [Iodinated Diagnostic Agents] Other (See Comments)    Patient does not want dye because it is hard on his kidneys  . Prednisone Rash  . Shellfish Allergy Itching and Rash    Crab meat    Review of Systems appetite is excellent Patient is non-ambulatory  BP 97/60  Pulse 57  Resp 16  Ht 6' (1.829 m)  Wt 153 lb (69.4 kg)  BMI 20.75 kg/m2  SpO2 97% Physical Exam Breath sounds fairly clear, somewhat diminished on the left Chest tube site clean and dry, suture intact Mini expressed chamber is draining -- . chest tube dressing is changed  Diagnostic Tests: Chest x-ray shows improvement in left empyema cavity, good tube position  Impression: Doing well continue current care  Plan: Return with chest x-ray in 2 weeks

## 2013-03-22 ENCOUNTER — Other Ambulatory Visit: Payer: Self-pay | Admitting: *Deleted

## 2013-03-22 MED ORDER — HYDROCODONE-ACETAMINOPHEN 5-325 MG PO TABS: ORAL_TABLET | ORAL | Status: AC

## 2013-03-25 ENCOUNTER — Non-Acute Institutional Stay (SKILLED_NURSING_FACILITY): Payer: Medicare Other | Admitting: Nurse Practitioner

## 2013-03-25 DIAGNOSIS — N183 Chronic kidney disease, stage 3 unspecified: Secondary | ICD-10-CM

## 2013-03-25 DIAGNOSIS — I82A19 Acute embolism and thrombosis of unspecified axillary vein: Secondary | ICD-10-CM

## 2013-03-25 DIAGNOSIS — D631 Anemia in chronic kidney disease: Secondary | ICD-10-CM

## 2013-03-25 DIAGNOSIS — J869 Pyothorax without fistula: Secondary | ICD-10-CM

## 2013-03-25 DIAGNOSIS — N039 Chronic nephritic syndrome with unspecified morphologic changes: Secondary | ICD-10-CM

## 2013-03-25 DIAGNOSIS — E43 Unspecified severe protein-calorie malnutrition: Secondary | ICD-10-CM

## 2013-03-25 DIAGNOSIS — I5022 Chronic systolic (congestive) heart failure: Secondary | ICD-10-CM

## 2013-03-25 DIAGNOSIS — I82A12 Acute embolism and thrombosis of left axillary vein: Secondary | ICD-10-CM

## 2013-03-25 NOTE — Progress Notes (Signed)
Patient ID: [name], male   DOB: 02/03/1935, 79 y.o.   MRN: [id number]    Nursing Home Location:  [place] of Service: SNF (25)  PCP: Myriam Jacobson, MD  Allergies  Allergen Reactions  . Contrast Media [Iodinated Diagnostic Agents] Other (See Comments)    Patient does not want dye because it is hard on his kidneys  . Prednisone Rash  . Shellfish Allergy Itching and Rash    Crab meat    Chief Complaint  Patient presents with  . Medical Managment of Chronic Issues    HPI:  79 y.o. male who was recently hospitalized for Vats/decortication for evaluation of chronic loculated left pleural effusion.  He now has L sided chest tube and conts to follow up out pt with cardiothoracic and pulmonary   his medical history is significant for heart failure with ejection fraction of 20%, mild to moderate mitral regurgitation, and also chronic renal insufficiency with a creatinine of 2.3. Pt also positive for LUE DVT and was admitted back to [facility] on 03/13/13; pt currently doing well, reports no shortness of breath,chest pain, fever or chills.   Review of Systems:  Review of Systems  Constitutional: Negative for fever, chills and malaise/fatigue.  Eyes: Negative for blurred vision.  Respiratory: Negative for cough, sputum production and shortness of breath.   Cardiovascular: Negative for chest pain, palpitations and leg swelling.  Gastrointestinal: Negative for heartburn, abdominal pain and constipation.  Genitourinary: Negative for dysuria, urgency and frequency.  Musculoskeletal: Negative for back pain, joint pain and myalgias.  Skin: Negative.   Neurological: Positive for weakness (generalized ). Negative for dizziness and headaches.  Psychiatric/Behavioral: Negative for depression.     Past Medical History  Diagnosis Date  . Aortic insufficiency     a. Previously mod-severe;  b. 03/2012 Echo: Mild to Mod AI  . Atrial flutter     resolved    . Anemia   . PVCs (premature ventricular contractions)   . Chronic systolic CHF (congestive heart failure)     a.  03/2012 Echo: EF 15%, Mild to Mod AI, Mod MR  . Bilateral hydronephrosis     a. 03/2012 Renal u/s: Bilat Severe hydronephrosis, medical renal dzs.  . CKD (chronic kidney disease), stage III   . Non Hodgkin's lymphoma     resolved >10 yr ago  . Moderate mitral regurgitation     a.  03/2012 Echo: Mod MR  . BPH (benign prostatic hyperplasia)     a. noted on renal u/s 03/2012.  . Ischemic cardiomyopathy     a.  Lexiscan Myoview 04/24/12: Inferior apical MI, study not gated due to frequent PVCs, triplets => patient refuses cath  . Gout   . Cardiomyopathy, ischemic 04/24/2012  . Acute systolic CHF (congestive heart failure) 04/17/2012  . Atrial fibrillation 02/02/2013  . CAD (coronary artery disease) 04/24/2012  . Chronic systolic heart failure [id number]  . Acute respiratory failure 01/31/2013  . CAP (community acquired pneumonia) 01/30/2013  . Pleural effusion, left 01/29/2013  . Empyema 01/30/2013  . Protein-calorie malnutrition, severe 01/31/2013  . Gait abnormality     Hx: of  . Mitral valve insufficiency     Hx: of   . Aortic valve stenosis     Hx: of  . Paroxysmal supraventricular tachycardia     Hx: of  . Shortness of breath    Past Surgical History  Procedure Laterality Date  . Lymph node dissection  Groin  . Inguinal hernia repair    . Colonoscopy w/ biopsies and polypectomy      Hx: of   . Tonsillectomy    . Chest tube insertion  03/07/2013     PLANNED VATS           DR VANTRIGT  . Chest tube insertion Left 03/07/2013    Procedure: CHEST TUBE INSERTION;  Surgeon: Ivin Poot, MD;  Location: [place];  Service: Thoracic;  Laterality: Left;  . Talc pleurodesis Left 03/07/2013    Procedure: Pietro Cassis;  Surgeon: Ivin Poot, MD;  Location: [place];  Service: Thoracic;  Laterality: Left;   Social History:   reports that he has never smoked. He has  never used smokeless tobacco. He reports that he does not drink alcohol or use illicit drugs.  Family History  Problem Relation Age of Onset  . Prostate cancer Father     Medications: Patient's Medications  New Prescriptions   No medications on file  Previous Medications   DOCUSATE SODIUM (COLACE) 100 MG CAPSULE    Take 100 mg by mouth 2 (two) times daily.   ENOXAPARIN (LOVENOX) 30 MG/0.3ML INJECTION    Inject 0.3 mLs (30 mg total) into the skin daily.   FEEDING SUPPLEMENT, ENSURE COMPLETE, (ENSURE COMPLETE) LIQD    Take 237 mLs by mouth daily after supper.   FERROUS SULFATE 325 (65 FE) MG TABLET    Take 325 mg by mouth 2 (two) times daily with a meal.   FUROSEMIDE (LASIX) 40 MG TABLET    Take 40 mg by mouth daily as needed for fluid (for weight gain of 3 pounds or more).    HYDROCODONE-ACETAMINOPHEN (NORCO/VICODIN) 5-325 MG PER TABLET    Take one tablet by mouth every 4 hours as needed for mild pain; Take two tablets by mouth every 4 hours as needed for moderate to severe pain   METOPROLOL TARTRATE (LOPRESSOR) 25 MG TABLET    Take 1 tablet (25 mg total) by mouth 2 (two) times daily.   NITROGLYCERIN (NITROSTAT) 0.4 MG SL TABLET    Place 1 tablet (0.4 mg total) under the tongue every 5 (five) minutes x 3 doses as needed for chest pain.   SACCHAROMYCES BOULARDII (FLORASTOR) 250 MG CAPSULE    Take 250 mg by mouth 2 (two) times daily.   TAMSULOSIN (FLOMAX) 0.4 MG CAPS CAPSULE    Take 1 capsule (0.4 mg total) by mouth daily after supper.   TRAMADOL (ULTRAM) 50 MG TABLET    Take 1 tablet (50 mg total) by mouth every 6 (six) hours as needed for moderate pain.  Modified Medications   No medications on file  Discontinued Medications   AMOXICILLIN-CLAVULANATE (AUGMENTIN) 875-125 MG PER TABLET    Take 1 tablet by mouth 2 (two) times daily. For 10 days     Physical Exam: Physical Exam  Constitutional: No distress.  HENT:  Mouth/Throat: Oropharynx is clear and moist. No oropharyngeal exudate.    Cardiovascular: Normal rate.   Pulmonary/Chest: Effort normal. No respiratory distress.  Diminished BS  Abdominal: Soft. Bowel sounds are normal. He exhibits no distension. There is no tenderness.  Musculoskeletal: He exhibits edema (2+bilaterally). He exhibits no tenderness.  Neurological: He is alert.  Skin: Skin is warm and dry. He is not diaphoretic. No erythema.     Filed Vitals:   03/25/13 1046  BP: 100/55  Pulse: 60  Temp: 97.7 F (36.5 C)  Resp: 20  SpO2: 91%  Labs reviewed: Basic Metabolic Panel:  Recent Labs  04/12/12 2129  04/14/12 1045  04/17/12 0605  03/08/13 0410  03/10/13 0930 03/12/13 0354 03/13/13 0452  NA  --   < >  --   < > 141  < > 140  < > 138 138 138  K  --   < >  --   < > 4.8  < > 5.3*  < > 5.4* 5.6* 4.7  CL  --   < >  --   < > 102  < > 111  < > 107 107 107  CO2  --   < >  --   < > 26  < > 23  < > 24 24 23   GLUCOSE  --   < >  --   < > 93  < > 106*  < > 143* 104* 100*  BUN  --   < >  --   < > 68*  < > 36*  < > 36* 49* 57*  CREATININE  --   < >  --   < > 2.60*  < > 2.11*  < > 1.99* 2.32* 2.43*  CALCIUM  --   < >  --   < > 8.3*  < > 8.1*  < > 7.8* 8.1* 7.7*  MG 1.9  --  1.6  --   --   --  1.9  --   --   --   --   PHOS  --   --   --   --  4.8*  --  4.4  --   --   --   --   < > = values in this interval not displayed. Liver Function Tests:  Recent Labs  02/01/13 0545 03/04/13 1315 03/09/13 0320  AST 18 17 8   ALT 15 13 7   ALKPHOS 68 97 58  BILITOT 0.3 0.2* 0.3  PROT 6.6 7.1 5.7*  ALBUMIN 1.7* 2.1* 1.8*   No results found for this basename: LIPASE, AMYLASE,  in the last 8760 hours No results found for this basename: AMMONIA,  in the last 8760 hours CBC:  Recent Labs  04/12/12 1224 04/15/12 1011 01/29/13 1050  03/09/13 0320 03/10/13 0930 03/12/13 0354  WBC 3.2* 3.6* 7.1  < > 5.1 6.7 8.2  NEUTROABS 2.3 2.5 6.0  --   --   --   --   HGB 11.0* 11.8* 9.0*  < > 7.7* 8.3* 8.5*  HCT 32.8* 36.0* 26.5*  < > 24.5* 26.6* 27.1*  MCV  87.5 87.8 83.6  < > 90.4 91.1 91.2  PLT 111* 119* 290  < > 129* 152 179  < > = values in this interval not displayed. Cardiac Enzymes:  Recent Labs  03/07/13 0950 03/07/13 1500 03/07/13 2145  TROPONINI <0.30 <0.30 <0.30   BNP: No components found with this basename: POCBNP,  CBG:  Recent Labs  03/08/13 2349 03/09/13 0309 03/09/13 0759  GLUCAP 116* 83 83   TSH:  Recent Labs  04/12/12 1656  TSH 2.605   A1C: No results found for this basename: HGBA1C   Lipid Panel:  Recent Labs  04/13/12 0222  CHOL 138  HDL 60  LDLCALC 64  TRIG 68  CHOLHDL 2.3    03/22/13- sodium 142, potassium 5.6, glucose 95, BUN 61, Cr 2.4  Assessment/Plan 1. Empyema of left pleural space S/p VATS without No infection noted; with chest tube, follows up with  pulmonary tomorrow and thoracic  surgeon next week  2. DVT of left axillary vein, acute -currently on full dose Lovenox   3. Chronic systolic heart failure -stable at this time; currently on lasix  4. Protein-calorie malnutrition, severe -conts to be followed by ST and RD at [facility]  5. CKD (chronic kidney disease), stage III -stable from hospital -will follow up BMP due to potassium   6. Anemia in chronic kidney disease(285.21) -will follow up cbc at this time

## 2013-03-26 ENCOUNTER — Encounter: Payer: Self-pay | Admitting: Adult Health

## 2013-03-26 ENCOUNTER — Ambulatory Visit (INDEPENDENT_AMBULATORY_CARE_PROVIDER_SITE_OTHER): Payer: Medicare Other | Admitting: Adult Health

## 2013-03-26 ENCOUNTER — Non-Acute Institutional Stay (SKILLED_NURSING_FACILITY): Payer: Medicare Other | Admitting: Internal Medicine

## 2013-03-26 VITALS — BP 90/64 | HR 50 | Temp 98.0°F | Ht 72.0 in | Wt 155.0 lb

## 2013-03-26 DIAGNOSIS — N183 Chronic kidney disease, stage 3 unspecified: Secondary | ICD-10-CM

## 2013-03-26 DIAGNOSIS — I5022 Chronic systolic (congestive) heart failure: Secondary | ICD-10-CM

## 2013-03-26 DIAGNOSIS — I2589 Other forms of chronic ischemic heart disease: Secondary | ICD-10-CM

## 2013-03-26 DIAGNOSIS — I82A12 Acute embolism and thrombosis of left axillary vein: Secondary | ICD-10-CM

## 2013-03-26 DIAGNOSIS — J869 Pyothorax without fistula: Secondary | ICD-10-CM

## 2013-03-26 DIAGNOSIS — I255 Ischemic cardiomyopathy: Secondary | ICD-10-CM

## 2013-03-26 DIAGNOSIS — I82A19 Acute embolism and thrombosis of unspecified axillary vein: Secondary | ICD-10-CM

## 2013-03-26 DIAGNOSIS — J90 Pleural effusion, not elsewhere classified: Secondary | ICD-10-CM

## 2013-03-26 NOTE — Assessment & Plan Note (Signed)
Left pleural effusion w/ empyema requiring chest tube  Pt failed VATS d/t severe hypotension w/ anesthesia.  Did have talc pleurodesis with large bore CT on left  Seen by surgeon last week, improved aeration Will hold on cxr today as has one scheduled next week  Has finished abx and clinically is improving  Will have him return in 3-4 weeks with Dr. Melvyn Novas

## 2013-03-26 NOTE — Assessment & Plan Note (Signed)
Left arm DVT during hospitalization  On lovenox  Cont follow up with PCP at [facility]

## 2013-03-26 NOTE — Patient Instructions (Signed)
Continue on current regimen .  Follow up with Dr. Prescott Gum next week as planned with chest xray  Follow up Dr. Melvyn Novas  In 4 weeks and As needed

## 2013-03-26 NOTE — Progress Notes (Signed)
Subjective:     Patient ID: [name], male   DOB: 1934/06/08, 79 y.o.   MRN: [id number]  HPI 79 yo never smoker   Admission date: 01/29/2013 Admitting Physician Elmarie Shiley, MD  Discharge Date: 02/08/2013  Primary MD Myriam Jacobson, MD  Recommendations for primary care physician for things to follow:  Follow chest x-ray, make sure patient follows with pulmonary on a close basis  Admission Diagnosis Bilateral hydronephrosis [176]  Acute systolic CHF (congestive heart failure) [428.21, 428.0]  Acute dyspnea [786.09]  Discharge Diagnosis left-sided empyema  Principal Problem:  Pleural effusion, left  Active Problems:  Aortic insufficiency  CKD (chronic kidney disease), stage III  Acute systolic CHF (congestive heart failure)  Bilateral hydronephrosis  Cardiomyopathy, ischemic  CAD (coronary artery disease)  CAP (community acquired pneumonia)  Empyema  Paroxysmal a-fib  Acute respiratory failure  Trapped lung  Protein-calorie malnutrition, severe  Atrial fibrillation   02/25/2013 post hosp f/u ov/Wert re: cpa/ empyema in never smoker Chief Complaint  Patient presents with  . HFU    Pt states that his breathing is much improved since hospital d/c. He has prod cough with clear to yellow sputum.   cough started  Toward end of October 2014, then admit as above and improved p dc/ to SNF  Except still has rattling cough, min white mucus,  ex daily limited  bystrength and balance  no 02 >>no changes   03/26/2013 [facility] Follow up for chronic loculated left pleural effusion Pt returns after hospitalization. admited 12/18-23 for chronic left effusion. Had PNA -LLL w/ empyema tx with ABX , thoracentesis, and chest tube. Went for VATS and decortication. Unfortunately , had severe hypotension on induction of anesthesia requiring high dose pressors and volume to restore blood pressure. Unable to procede for VATS.  A large bore chest tube was inserted w/ talc pleurodesis.  He did receive IV abx . Hospitalization was complicated by Left arm DVT -tx w/ lovenox . Discharged on Lovenox DVT prophylaxis. Swallow study low risk for aspiration . Discharged on 10 days of Augmentin.  Discharged to SNF for rehab.  CHest tube is drainage ~250cc daily   He has a hx of significant for heart failure with ejection  fraction of 20%, mild to moderate mitral regurgitation, and also chronic renal insufficiency with a creatinine of 2.3.  Pt returns says he is feeling some better.  Less leg swelling and dypsnea.  Seen by Dr. Prescott Gum 03/20/13 with chest xray w/ no change in mod hydropneumothorax. Improved aeration in LLL .  Has follow up next week with repeat cxr at Dr. Prescott Gum office .   No fever, chest pain , increased edema or n/v/d .    Current Medications, Allergies, Complete Past Medical History, Past Surgical History, Family History, and Social History were reviewed in [employer] record.  ROS  The following are not active complaints unless bolded sore throat, dysphagia, dental problems, itching, sneezing,  nasal congestion or excess/ purulent secretions, ear ache,   fever, chills, sweats, unintended wt loss, pleuritic or exertional cp, hemoptysis,  orthopnea pnd   presyncope, palpitations, heartburn, abdominal pain, anorexia, nausea, vomiting, diarrhea  or change in bowel or urinary habits, change in stools or urine, dysuria,hematuria,  rash, arthralgias, visual complaints, headache, numbness weakness or ataxia or problems with walking or coordination,  change in mood/affect or memory.                Objective:   Physical  Exam  W/c bound   HEENT: nl dentition, turbinates, and orophanx. Nl external ear canals without cough reflex   NECK :  without JVD/Nodes/TM/ nl carotid upstrokes bilaterally   LUNGS: no acc muscle use, decreased bs with dullness L base, chest tube side with dry bandage    CV:  RRR  no s3 or murmur or increase in  P2,  2 Plus sym pitting edema   ABD:  soft and nontender with nl excursion in the supine position. No bruits or organomegaly, bowel sounds nl  MS:  warm without deformities, calf tenderness, cyanosis or clubbing  SKIN: warm and dry without lesions    NEURO:  alert, approp, no deficits     CXR  02/25/2013 : moderate to large pleural effusion. Associated parenchymal lung opacity may reflect atelectasis, infiltrate or a combination. 2. Stable changes of COPD and cardiomegaly.       Assessment:

## 2013-03-28 ENCOUNTER — Ambulatory Visit: Payer: Medicare Other | Admitting: Podiatrist

## 2013-03-29 ENCOUNTER — Emergency Department (HOSPITAL_COMMUNITY): Payer: Medicare Other

## 2013-03-29 ENCOUNTER — Other Ambulatory Visit: Payer: Self-pay

## 2013-03-29 ENCOUNTER — Emergency Department (HOSPITAL_COMMUNITY)
Admission: EM | Admit: 2013-03-29 | Discharge: 2013-03-29 | Disposition: A | Discharge status: Skilled Nursing Facility | Payer: Medicare Other | Attending: Emergency Medicine | Admitting: Emergency Medicine

## 2013-03-29 ENCOUNTER — Encounter (HOSPITAL_COMMUNITY): Payer: Self-pay | Admitting: Emergency Medicine

## 2013-03-29 DIAGNOSIS — R05 Cough: Secondary | ICD-10-CM | POA: Insufficient documentation

## 2013-03-29 DIAGNOSIS — N183 Chronic kidney disease, stage 3 unspecified: Secondary | ICD-10-CM | POA: Insufficient documentation

## 2013-03-29 DIAGNOSIS — J869 Pyothorax without fistula: Secondary | ICD-10-CM

## 2013-03-29 DIAGNOSIS — I493 Ventricular premature depolarization: Secondary | ICD-10-CM

## 2013-03-29 DIAGNOSIS — R059 Cough, unspecified: Secondary | ICD-10-CM | POA: Insufficient documentation

## 2013-03-29 DIAGNOSIS — Z862 Personal history of diseases of the blood and blood-forming organs and certain disorders involving the immune mechanism: Secondary | ICD-10-CM | POA: Insufficient documentation

## 2013-03-29 DIAGNOSIS — J438 Other emphysema: Secondary | ICD-10-CM | POA: Insufficient documentation

## 2013-03-29 DIAGNOSIS — Z7901 Long term (current) use of anticoagulants: Secondary | ICD-10-CM | POA: Insufficient documentation

## 2013-03-29 DIAGNOSIS — Z8639 Personal history of other endocrine, nutritional and metabolic disease: Secondary | ICD-10-CM | POA: Insufficient documentation

## 2013-03-29 DIAGNOSIS — I4891 Unspecified atrial fibrillation: Secondary | ICD-10-CM | POA: Insufficient documentation

## 2013-03-29 DIAGNOSIS — I2510 Atherosclerotic heart disease of native coronary artery without angina pectoris: Secondary | ICD-10-CM | POA: Insufficient documentation

## 2013-03-29 DIAGNOSIS — Z8701 Personal history of pneumonia (recurrent): Secondary | ICD-10-CM | POA: Insufficient documentation

## 2013-03-29 DIAGNOSIS — I129 Hypertensive chronic kidney disease with stage 1 through stage 4 chronic kidney disease, or unspecified chronic kidney disease: Secondary | ICD-10-CM | POA: Insufficient documentation

## 2013-03-29 DIAGNOSIS — E860 Dehydration: Secondary | ICD-10-CM | POA: Insufficient documentation

## 2013-03-29 DIAGNOSIS — N400 Enlarged prostate without lower urinary tract symptoms: Secondary | ICD-10-CM | POA: Insufficient documentation

## 2013-03-29 DIAGNOSIS — D649 Anemia, unspecified: Secondary | ICD-10-CM | POA: Insufficient documentation

## 2013-03-29 DIAGNOSIS — I4949 Other premature depolarization: Secondary | ICD-10-CM | POA: Insufficient documentation

## 2013-03-29 DIAGNOSIS — I5023 Acute on chronic systolic (congestive) heart failure: Secondary | ICD-10-CM | POA: Insufficient documentation

## 2013-03-29 DIAGNOSIS — Z79899 Other long term (current) drug therapy: Secondary | ICD-10-CM | POA: Insufficient documentation

## 2013-03-29 DIAGNOSIS — Z87898 Personal history of other specified conditions: Secondary | ICD-10-CM | POA: Insufficient documentation

## 2013-03-29 LAB — CBC WITH DIFFERENTIAL/PLATELET
BASOS PCT: 0 % (ref 0–1)
Basophils Absolute: 0 10*3/uL (ref 0.0–0.1)
EOS ABS: 0.1 10*3/uL (ref 0.0–0.7)
EOS PCT: 1 % (ref 0–5)
HCT: 32.9 % — ABNORMAL LOW (ref 39.0–52.0)
Hemoglobin: 9.8 g/dL — ABNORMAL LOW (ref 13.0–17.0)
Lymphocytes Relative: 17 % (ref 12–46)
Lymphs Abs: 0.9 10*3/uL (ref 0.7–4.0)
MCH: 28.8 pg (ref 26.0–34.0)
MCHC: 29.8 g/dL — AB (ref 30.0–36.0)
MCV: 96.8 fL (ref 78.0–100.0)
MONOS PCT: 6 % (ref 3–12)
Monocytes Absolute: 0.3 10*3/uL (ref 0.1–1.0)
NEUTROS PCT: 75 % (ref 43–77)
Neutro Abs: 3.9 10*3/uL (ref 1.7–7.7)
Platelets: 146 10*3/uL — ABNORMAL LOW (ref 150–400)
RBC: 3.4 MIL/uL — ABNORMAL LOW (ref 4.22–5.81)
RDW: 18.9 % — ABNORMAL HIGH (ref 11.5–15.5)
WBC: 5.2 10*3/uL (ref 4.0–10.5)

## 2013-03-29 LAB — POCT I-STAT, CHEM 8
BUN: 43 mg/dL — ABNORMAL HIGH (ref 6–23)
CALCIUM ION: 1.22 mmol/L (ref 1.13–1.30)
Chloride: 110 mEq/L (ref 96–112)
Creatinine, Ser: 2 mg/dL — ABNORMAL HIGH (ref 0.50–1.35)
GLUCOSE: 80 mg/dL (ref 70–99)
HEMATOCRIT: 32 % — AB (ref 39.0–52.0)
HEMOGLOBIN: 10.9 g/dL — AB (ref 13.0–17.0)
Potassium: 5 mEq/L (ref 3.7–5.3)
Sodium: 148 mEq/L — ABNORMAL HIGH (ref 137–147)
TCO2: 28 mmol/L (ref 0–100)

## 2013-03-29 LAB — BASIC METABOLIC PANEL
BUN: 45 mg/dL — ABNORMAL HIGH (ref 6–23)
CALCIUM: 8.3 mg/dL — AB (ref 8.4–10.5)
CO2: 28 mEq/L (ref 19–32)
CREATININE: 1.86 mg/dL — AB (ref 0.50–1.35)
Chloride: 111 mEq/L (ref 96–112)
GFR, EST AFRICAN AMERICAN: 39 mL/min — AB (ref >90)
GFR, EST NON AFRICAN AMERICAN: 33 mL/min — AB (ref >90)
Glucose, Bld: 79 mg/dL (ref 70–99)
Potassium: 5.1 mEq/L (ref 3.7–5.3)
Sodium: 147 mEq/L (ref 137–147)

## 2013-03-29 NOTE — ED Notes (Signed)
PER PTAR: pt from nursing home, they took his BP and it was 97/55, HR-86, temp 98.0, gurgling sound and coughing up yellow and brown sputum. They called ems due to "clinical change and because the patient is a full code." EMS reports BP 122/60 and the pts cough and gurgling sound went away when they sat the patient up. Pt presents to ED with left chest tube placement due to a thoracentesis on Dec 18th, brownish red drainage. Afib.

## 2013-03-29 NOTE — ED Provider Notes (Addendum)
CSN: [id number]     Arrival date & time 03/29/13  2229 History   First MD Initiated Contact with Patient 03/29/13 [phone]     Chief Complaint  Patient presents with  . Cough   (Consider location/radiation/quality/duration/timing/severity/associated sxs/prior Treatment) HPI 79 yo male presents to the ER from his nursing home via EMS to 2 unclear complaints.  Patient, reports he's had increased loose stools since being given stool softeners.  Per EMS, patient has had increased cough, and had slightly low blood pressure.  This morning.  Patient has a left-sided chest tube due to chronic pleural effusion and failed VATS procedure last month.  Patient reports his cough is stable.  He was seen 2 days ago in the pulmonology clinic with stable exam.  MAR reviewed, patient is on Colace.  He has no other complaints at this time Past Medical History  Diagnosis Date  . Aortic insufficiency     a. Previously mod-severe;  b. 03/2012 Echo: Mild to Mod AI  . Atrial flutter     resolved  . Anemia   . PVCs (premature ventricular contractions)   . Chronic systolic CHF (congestive heart failure)     a.  03/2012 Echo: EF 15%, Mild to Mod AI, Mod MR  . Bilateral hydronephrosis     a. 03/2012 Renal u/s: Bilat Severe hydronephrosis, medical renal dzs.  . CKD (chronic kidney disease), stage III   . Non Hodgkin's lymphoma     resolved >10 yr ago  . Moderate mitral regurgitation     a.  03/2012 Echo: Mod MR  . BPH (benign prostatic hyperplasia)     a. noted on renal u/s 03/2012.  . Ischemic cardiomyopathy     a.  Lexiscan Myoview 04/24/12: Inferior apical MI, study not gated due to frequent PVCs, triplets => patient refuses cath  . Gout   . Cardiomyopathy, ischemic 04/24/2012  . Acute systolic CHF (congestive heart failure) 04/17/2012  . Atrial fibrillation 02/02/2013  . CAD (coronary artery disease) 04/24/2012  . Chronic systolic heart failure 04/24/2012  . Acute respiratory failure 01/31/2013  . CAP (community acquired  pneumonia) 01/30/2013  . Pleural effusion, left 01/29/2013  . Empyema 01/30/2013  . Protein-calorie malnutrition, severe 01/31/2013  . Gait abnormality     Hx: of  . Mitral valve insufficiency     Hx: of   . Aortic valve stenosis     Hx: of  . Paroxysmal supraventricular tachycardia     Hx: of  . Shortness of breath    Past Surgical History  Procedure Laterality Date  . Lymph node dissection      Groin  . Inguinal hernia repair    . Colonoscopy w/ biopsies and polypectomy      Hx: of   . Tonsillectomy    . Chest tube insertion  03/07/2013     PLANNED VATS           DR VANTRIGT  . Chest tube insertion Left 03/07/2013    Procedure: CHEST TUBE INSERTION;  Surgeon: Kerin Perna, MD;  Location: [facility] OR;  Service: Thoracic;  Laterality: Left;  . Talc pleurodesis Left 03/07/2013    Procedure: Lurlean Nanny;  Surgeon: Kerin Perna, MD;  Location: [facility] OR;  Service: Thoracic;  Laterality: Left;   Family History  Problem Relation Age of Onset  . Prostate cancer Father    History  Substance Use Topics  . Smoking status: Never Smoker   . Smokeless tobacco: Never Used  . Alcohol  Use: No    Review of Systems  See History of Present Illness; otherwise all other systems are reviewed and negative Allergies  Contrast media; Prednisone; and Shellfish allergy  Home Medications   Current Outpatient Rx  Name  Route  Sig  Dispense  Refill  . docusate sodium (COLACE) 100 MG capsule   Oral   Take 100 mg by mouth 2 (two) times daily.         Marland Kitchen enoxaparin (LOVENOX) 30 MG/0.3ML injection   Subcutaneous   Inject 0.3 mLs (30 mg total) into the skin daily.   0 Syringe      . feeding supplement, ENSURE COMPLETE, (ENSURE COMPLETE) LIQD   Oral   Take 237 mLs by mouth daily after supper.         . ferrous sulfate 325 (65 FE) MG tablet   Oral   Take 325 mg by mouth 2 (two) times daily with a meal.         . furosemide (LASIX) 40 MG tablet   Oral   Take 40 mg by mouth daily  as needed for fluid (for weight gain of 3 pounds or more).          Marland Kitchen HYDROcodone-acetaminophen (NORCO/VICODIN) 5-325 MG per tablet      Take one tablet by mouth every 4 hours as needed for mild pain; Take two tablets by mouth every 4 hours as needed for moderate to severe pain   360 tablet   0   . metoprolol tartrate (LOPRESSOR) 25 MG tablet   Oral   Take 1 tablet (25 mg total) by mouth 2 (two) times daily.         . nitroGLYCERIN (NITROSTAT) 0.4 MG SL tablet   Sublingual   Place 1 tablet (0.4 mg total) under the tongue every 5 (five) minutes x 3 doses as needed for chest pain.   25 tablet   3   . saccharomyces boulardii (FLORASTOR) 250 MG capsule   Oral   Take 250 mg by mouth 2 (two) times daily.         . tamsulosin (FLOMAX) 0.4 MG CAPS capsule   Oral   Take 1 capsule (0.4 mg total) by mouth daily after supper.   30 capsule      . traMADol (ULTRAM) 50 MG tablet   Oral   Take 1 tablet (50 mg total) by mouth every 6 (six) hours as needed for moderate pain.   30 tablet   0    BP 100/63  Pulse 68  Temp(Src) 97.5 F (36.4 C) (Oral)  Ht 6' (1.829 m)  Wt 152 lb (68.947 kg)  BMI 20.61 kg/m2  SpO2 100% Physical Exam  Nursing note and vitals reviewed. Constitutional: He is oriented to person, place, and time. He appears well-developed and well-nourished. No distress.  Chronically ill-appearing male.  No acute distress  HENT:  Head: Normocephalic and atraumatic.  Nose: Nose normal.  Mouth/Throat: Oropharynx is clear and moist.  Eyes: Conjunctivae and EOM are normal. Pupils are equal, round, and reactive to light.  Neck: Normal range of motion. Neck supple. No JVD present. No tracheal deviation present. No thyromegaly present.  Cardiovascular: Normal rate, regular rhythm, normal heart sounds and intact distal pulses.  Exam reveals no gallop and no friction rub.   No murmur heard. Pulmonary/Chest: Effort normal. No stridor. No respiratory distress. He has no  wheezes. He has no rales. He exhibits no tenderness.  Decreased breath sounds in bases.  Chest  tube with clean dressing, reddish brown fluid noted in container  Abdominal: Soft. Bowel sounds are normal. He exhibits no distension and no mass. There is no tenderness. There is no rebound and no guarding.  Musculoskeletal: Normal range of motion. He exhibits no edema and no tenderness.  Lymphadenopathy:    He has no cervical adenopathy.  Neurological: He is alert and oriented to person, place, and time. He exhibits normal muscle tone. Coordination normal.  Skin: Skin is warm and dry. No rash noted. No erythema. No pallor.  Psychiatric: He has a normal mood and affect. His behavior is normal. Judgment and thought content normal.    ED Course  Procedures (including critical care time) Labs Review Labs Reviewed  CBC WITH DIFFERENTIAL - Abnormal; Notable for the following:    RBC 3.40 (*)    Hemoglobin 9.8 (*)    HCT 32.9 (*)    MCHC 29.8 (*)    RDW 18.9 (*)    Platelets 146 (*)    All other components within normal limits  POCT I-STAT, CHEM 8 - Abnormal; Notable for the following:    Sodium 148 (*)    BUN 43 (*)    Creatinine, Ser 2.00 (*)    Hemoglobin 10.9 (*)    HCT 32.0 (*)    All other components within normal limits  BASIC METABOLIC PANEL   Imaging Review Dg Chest 2 View  03/29/2013   CLINICAL DATA:  Cough  EXAM: CHEST  2 VIEW  COMPARISON:  Prior radiograph from 03/20/2013  FINDINGS: Cardiomegaly is stable as compared to the prior examination.  A left-sided chest tube remains in place and is stable in position with tip overlying the left infrahilar region. The most proximal side-port is again seen lying outside the margins of the lateral left hemi thorax. An irregular left pleural effusion is grossly similar with similar loculation along the lateral left hemi thorax. Scattered foci of gas again seen within this region. Atelectasis and/or consolidation within the mid and lower left  lung is similar.  The right lung is grossly clear.  No pneumothorax.  IMPRESSION: 1. Stable position of left-sided chest tube with the with most proximal side-port exterior to the bony thorax. 2. Similar size in appearance of moderate left hydropneumothorax compared to prior study. 3. Persistent left mid and basilar patchy opacity, likely atelectasis, although possible infiltrates are not excluded. 4. Clear right lung. 5. Stable cardiomegaly without pulmonary edema.   Electronically Signed   By: Jeannine Boga M.D.   On: 03/29/2013 05:44    Date: 03/29/2013  Rate: 99  Rhythm: normal sinus rhythm and premature ventricular contractions (PVC)  QRS Axis: normal  Intervals: QT prolonged  ST/T Wave abnormalities: normal  Conduction Disutrbances:none  Narrative Interpretation: poor baseline, increased pvc from prior  Old EKG Reviewed: changes noted   EKG Interpretation   None       MDM   1. Cough   2. PVCs (premature ventricular contractions)   3. Mild dehydration   4. Empyema    79 year old male with stable cough, chest tube.  Patchy opacity on left side, but no change in cough.  No fever, and no elevated white blood cell count.  Suspect atelectasis.  Patient has followup.  This upcoming week with his CT surgeon.  His sodium is slightly elevated today.  Will encourage increased water intake.  We'll discontinue laxatives.  I feel patient is stable for discharge back to his facility.   Kalman Drape, MD 03/29/13  [place], MD 03/29/13 [phone]

## 2013-03-29 NOTE — ED Notes (Signed)
Pt reports he has "a potassium imbalance."

## 2013-03-29 NOTE — Discharge Instructions (Signed)
Please encourage patient to drink more water daily.  No stool softeners or laxatives.  Patient should follow up with his primary care doctor for recheck in 2-3 days.    Cough, Adult  A cough is a reflex that helps clear your throat and airways. It can help heal the body or may be a reaction to an irritated airway. A cough may only last 2 or 3 weeks (acute) or may last more than 8 weeks (chronic).  CAUSES Acute cough:  Viral or bacterial infections. Chronic cough:  Infections.  Allergies.  Asthma.  Post-nasal drip.  Smoking.  Heartburn or acid reflux.  Some medicines.  Chronic lung problems (COPD).  Cancer. SYMPTOMS   Cough.  Fever.  Chest pain.  Increased breathing rate.  High-pitched whistling sound when breathing (wheezing).  Colored mucus that you cough up (sputum). TREATMENT   A bacterial cough may be treated with antibiotic medicine.  A viral cough must run its course and will not respond to antibiotics.  Your caregiver may recommend other treatments if you have a chronic cough. HOME CARE INSTRUCTIONS   Only take over-the-counter or prescription medicines for pain, discomfort, or fever as directed by your caregiver. Use cough suppressants only as directed by your caregiver.  Use a cold steam vaporizer or humidifier in your bedroom or home to help loosen secretions.  Sleep in a semi-upright position if your cough is worse at night.  Rest as needed.  Stop smoking if you smoke. SEEK IMMEDIATE MEDICAL CARE IF:   You have pus in your sputum.  Your cough starts to worsen.  You cannot control your cough with suppressants and are losing sleep.  You begin coughing up blood.  You have difficulty breathing.  You develop pain which is getting worse or is uncontrolled with medicine.  You have a fever. MAKE SURE YOU:   Understand these instructions.  Will watch your condition.  Will get help right away if you are not doing well or get  worse. Document Released: 09/03/2010 Document Revised: 05/30/2011 Document Reviewed: 09/03/2010 [facility] Patient Information 2014 [place].  Dehydration, Adult Dehydration means your body does not have as much fluid as it needs. Your kidneys, brain, and heart will not work properly without the right amount of fluids and salt.  HOME CARE  Ask your doctor how to replace body fluid losses (rehydrate).  Drink enough fluids to keep your pee (urine) clear or pale yellow.  Drink small amounts of fluids often if you feel sick to your stomach (nauseous) or throw up (vomit).  Eat like you normally do.  Avoid:  Foods or drinks high in sugar.  Bubbly (carbonated) drinks.  Juice.  Very hot or cold fluids.  Drinks with caffeine.  Fatty, greasy foods.  Alcohol.  Tobacco.  Eating too much.  Gelatin desserts.  Wash your hands to avoid spreading germs (bacteria, viruses).  Only take medicine as told by your doctor.  Keep all doctor visits as told. GET HELP RIGHT AWAY IF:   You cannot drink something without throwing up.  You get worse even with treatment.  Your vomit has blood in it or looks greenish.  Your poop (stool) has blood in it or looks black and tarry.  You have not peed in 6 to 8 hours.  You pee a small amount of very dark pee.  You have a fever.  You pass out (faint).  You have belly (abdominal) pain that gets worse or stays in one spot (localizes).  You  have a rash, stiff neck, or bad headache.  You get easily annoyed, sleepy, or are hard to wake up.  You feel weak, dizzy, or very thirsty. MAKE SURE YOU:   Understand these instructions.  Will watch your condition.  Will get help right away if you are not doing well or get worse. Document Released: 01/01/2009 Document Revised: 05/30/2011 Document Reviewed: 10/25/2010 [facility] Patient Information 2014 [place], [place].  Premature Ventricular Contraction Premature ventricular contraction  (PVC) is an irregularity of the heart rhythm involving extra or skipped heartbeats. In some cases, they may occur without obvious cause or heart disease. Other times, they can be caused by an electrolyte change in the blood. These need to be corrected. They can also be seen when there is not enough oxygen going to the heart. A common cause of this is plaque or cholesterol buildup. This buildup decreases the blood supply to the heart. In addition, extra beats may be caused or aggravated by:  Excessive smoking.  Alcohol consumption.  Caffeine.  Certain medications  Some street drugs. SYMPTOMS   The sensation of feeling your heart skipping a beat (palpitations).  In many cases, the person may have no symptoms. SIGNS AND TESTS   A physical examination may show an occasional irregularity, but if the PVC beats do not happen often, they may not be found on physical exam.  Blood pressure is usually normal.  Other tests that may find extra beats of the heart are:  An EKG (electrocardiogram)  A Holter monitor which can monitor your heart over longer periods of time  An Angiogram (study of the heart arteries). TREATMENT  Usually extra heartbeats do not need treatment. The condition is treated only if symptoms are severe or if extra beats are very frequent or are causing problems. An underlying cause, if discovered, may also require treatment.  Treatment may also be needed if there may be a risk for other more serious cardiac arrhythmias.  PREVENTION   Moderation in caffeine, alcohol, and tobacco use may reduce the risk of ectopic heartbeats in some people.  Exercise often helps people who lead a sedentary (inactive) lifestyle. PROGNOSIS  PVC heartbeats are generally harmless and do not need treatment.  RISKS AND COMPLICATIONS   Ventricular tachycardia (occasionally).  There usually are no complications.  Other arrhythmias (occasionally). SEEK IMMEDIATE MEDICAL CARE IF:   You  feel palpitations that are frequent or continual.  You develop chest pain or other problems such as shortness of breath, sweating, or nausea and vomiting.  You become light-headed or faint (pass out).  You get worse or do not improve with treatment. Document Released: 10/23/2003 Document Revised: 05/30/2011 Document Reviewed: 05/04/2007 [facility] Patient Information 2014 [place].

## 2013-04-01 ENCOUNTER — Other Ambulatory Visit: Payer: Self-pay | Admitting: *Deleted

## 2013-04-01 DIAGNOSIS — J90 Pleural effusion, not elsewhere classified: Secondary | ICD-10-CM

## 2013-04-03 ENCOUNTER — Ambulatory Visit: Payer: Medicare Other | Admitting: Cardiothoracic Surgery

## 2013-04-04 NOTE — Progress Notes (Addendum)
Patient ID: [name], male   DOB: Jul 10, 1934, 79 y.o.   MRN: [id number]                PROGRESS NOTE  DATE:  03/26/2013    FACILITY: [facility]    LEVEL OF CARE:   SNF   Acute Visit   CHIEF COMPLAINT:  Follow up edema, problem with shortness of breath.    HISTORY OF PRESENT ILLNESS:  [name] is a gentleman who was in [facility] temporarily for an attempt at a VATS decortication of his left empyema.  The planned VATS was aborted due to severe hypotension on induction of general anesthesia.  He now has a chest tube drainage system.  He was put on oral Augmentin.  pleral  fluid cultures from the OR were negative.    PAST MEDICAL HISTORY/PROBLEM LIST:    Aortic insufficiency.     Moderate to severe atrial flutter.    Anemia.    PVCs.    Chronic systolic heart failure with an ejection fraction of 15% via echo in January 2014.        Bilateral hydronephrosis.    Stage III chronic kidney failure.    History of non-Hodgkin's lymphoma, resolved over 10 years ago.      Moderate mitral regurgitation.    BPH.    Ischemic cardiomyopathy.    Gout.    History of coronary artery disease.    History of community-acquired pneumonia.    Empyema in November 2014.    CURRENT MEDICATIONS:    Lopressor 25 b.i.d.    Florastor 250 b.i.d.    Flomax 0.4 q.d.    Ultram 50 q.6 p.r.n.    Nitroglycerin 0.4 sublingual p.r.n.    Augmentin 875 b.i.d. for 10 days, which should have completed earlier this month.     Colace 100 b.i.d.    Lovenox 30 mg daily (axillary vein DVT in the hospital??).    Lasix p.r.n. for weight gain of 3 lbs or more.    Norco 5/325 p.o. q.4.    REVIEW OF SYSTEMS:   CHEST/RESPIRATORY:  The patient is not complaining of dyspnea.   CARDIAC:   He does not complain of chest pain.   GI:  No diarrhea.   GU:  No dysuria.    PHYSICAL EXAMINATION:   VITAL SIGNS:   RESPIRATIONS:  24.   O2 SATURATIONS:  92% on 2 L.   PULSE:  56.   GENERAL  APPEARANCE:  The patient does not look as well as I am used to seeing him.   He does appear dyspneic.   CHEST/RESPIRATORY:  Decreased air entry over the left lower lobe.  Right lung:  Shallow air entry, but clear.   CARDIOVASCULAR:  CARDIAC:   Heart sounds are distant.  There are no murmurs.  JVP is elevated to the angle of the jaw at 90 degrees.   GASTROINTESTINAL:  LIVER/SPLEEN/KIDNEYS:  No liver, no spleen.  No tenderness.   CIRCULATION:   EDEMA/VARICOSITIES:  Extremities:  Edema well up into his thighs.    ASSESSMENT/PLAN:  ? Biventricular heart failure.  I am going to resume the Lasix at 40 mg daily.  He will need follow-up lab work.    Chronic renal failure.  Lab work from yesterday showed a BUN of 51, a creatinine of 2.27, and a potassium of 5.5.  The BUN and creatinine are not that much different from his previous values.    Left-sided empyema.   Chest tube in  place,  apparently is draining well.  He saw [facility] today.    I am concerned about this patient's heart failure.   CPT CODE: 70786

## 2013-04-21 DEATH — deceased

## 2013-04-23 ENCOUNTER — Ambulatory Visit: Payer: Medicare Other | Admitting: Internal Medicine

## 2014-12-18 IMAGING — CR DG CHEST 2V
2 series · 2 of 2 positions shown · non-contrast
Comparison: 04/12/2012

CLINICAL DATA: Follow-up congestive heart failure

CHEST - 2 VIEW

[w chest pa]
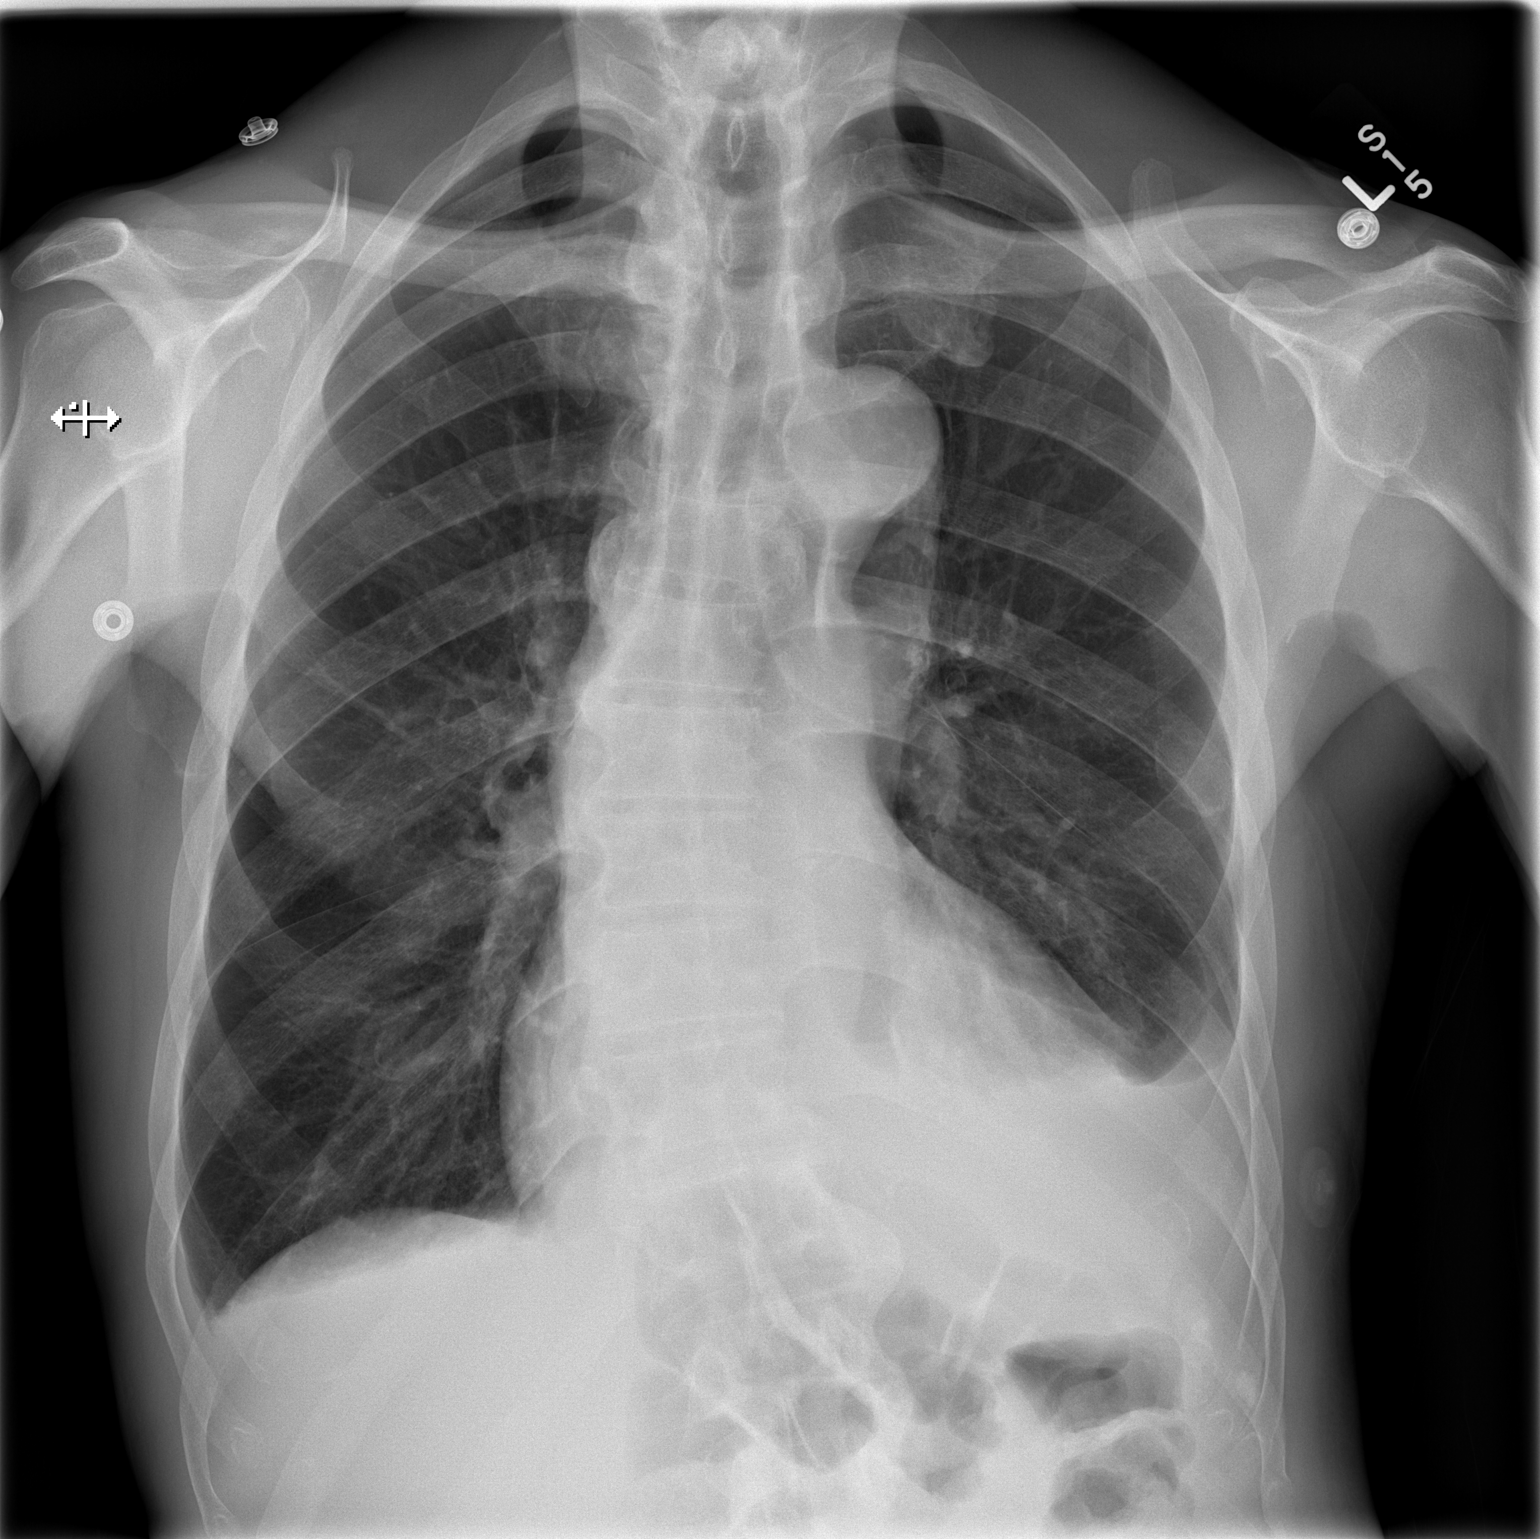

[w chest lat]
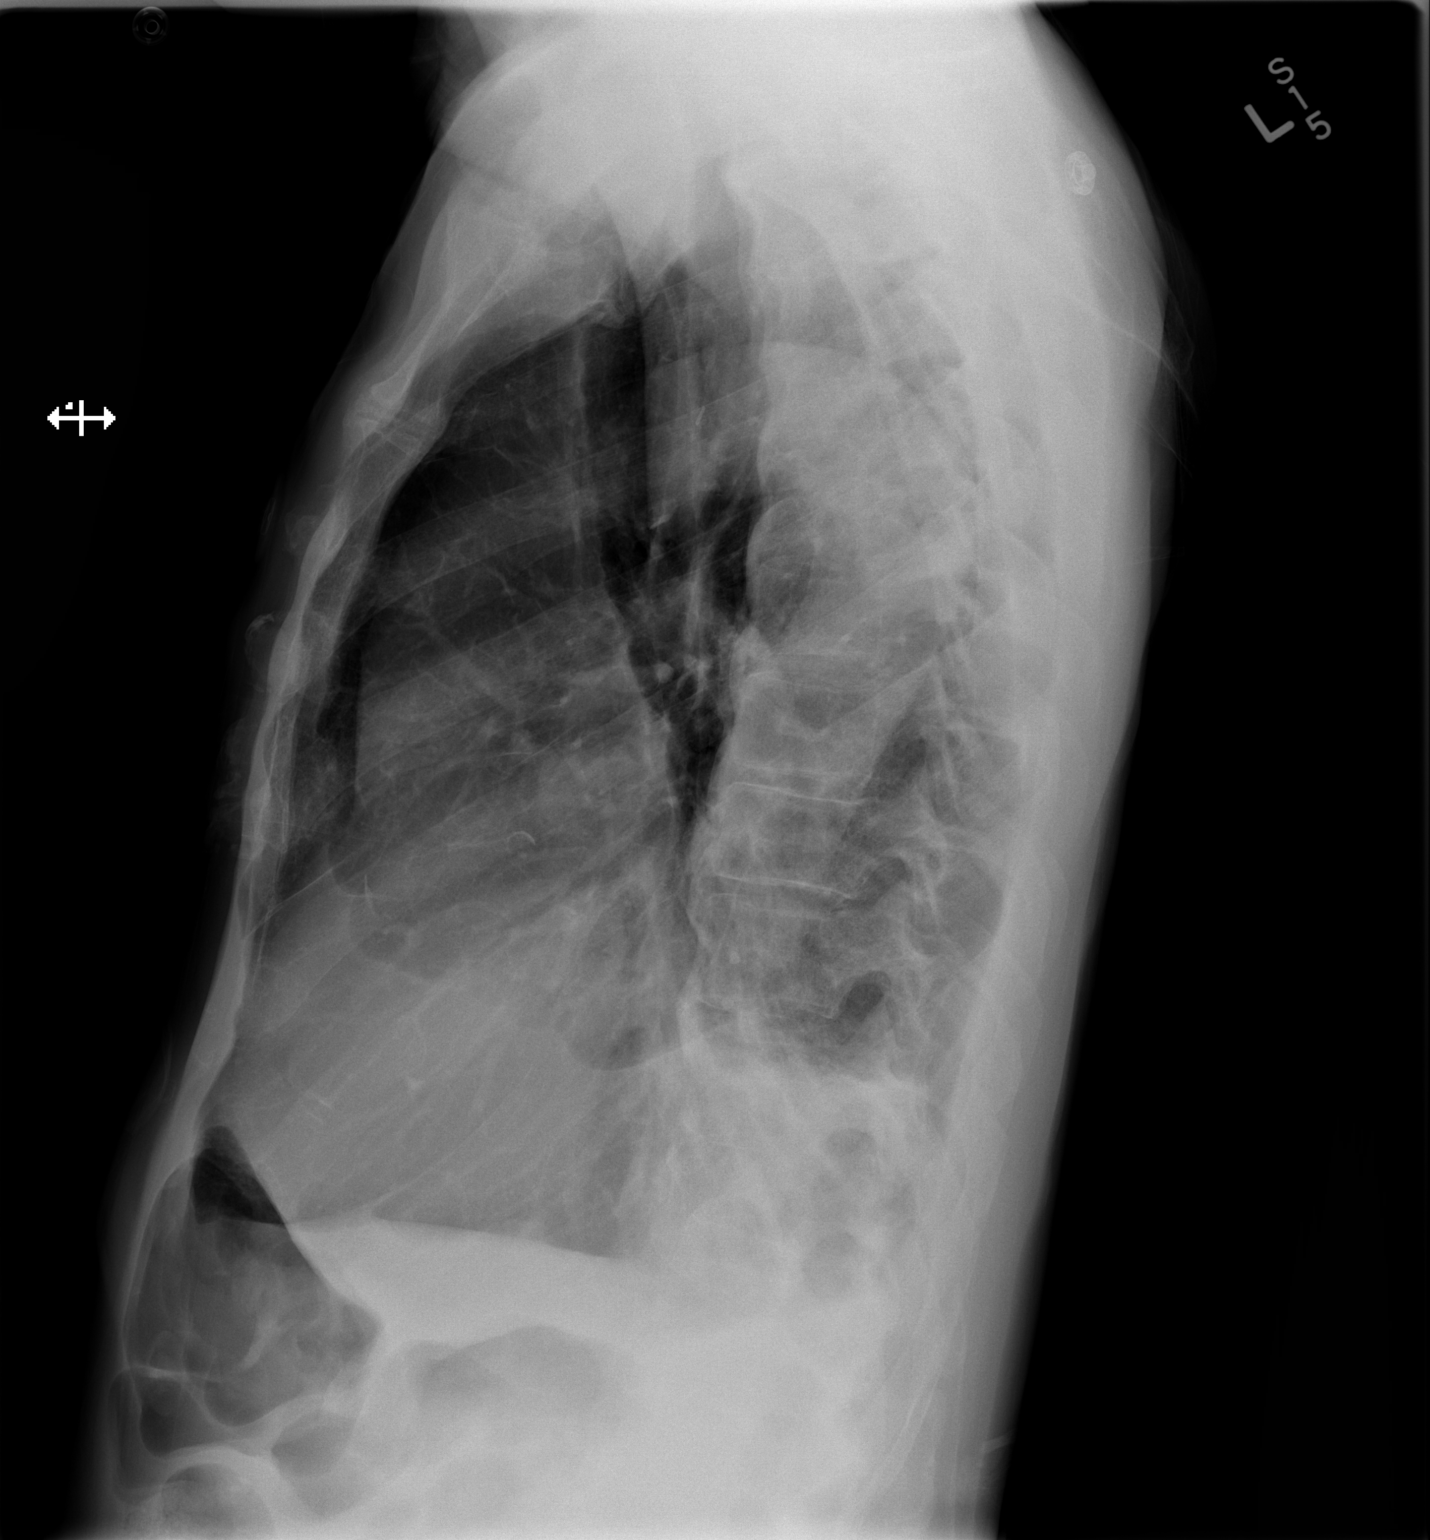

[2 of 2 positions shown; findings below may reference images not displayed]

FINDINGS: Cardiac silhouette upper normal in size.  Vascular
pattern is normal.  Tiny right effusion.  Small to moderate left
effusion.  There is also bilateral lower lobe atelectasis.
IMPRESSION: No significant change from prior study with bibasilar atelectasis
and effusions.

## 2015-10-03 IMAGING — CT CT CHEST W/O CM
2 of 3 series · 15 of 36 positions shown, 18 images · non-contrast
Comparison: Chest radiographs obtained earlier today and chest CT
dated 03/27/2006.

CLINICAL DATA: Increasing dyspnea.  Productive cough.

EXAM:
CT CHEST WITHOUT CONTRAST
TECHNIQUE: Multidetector CT imaging of the chest was performed following the
standard protocol without IV contrast.

[Series 2: chest without · axial · non-contrast · 0.68mm/px · z∈[+275,+625]mm · 12 of 84 slices shown, 15 images]
[im 7/84  mediastinal]
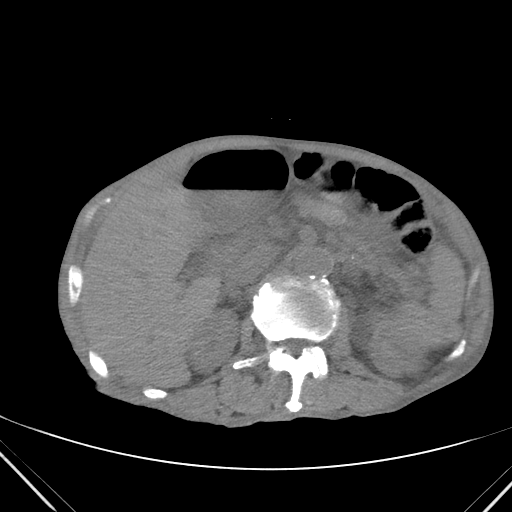
[im 7/84  lung]
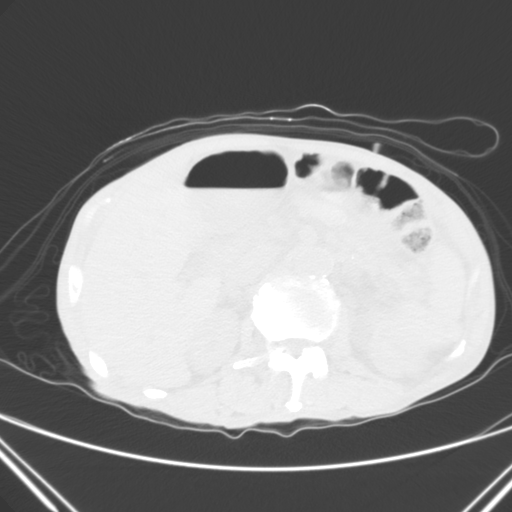
[im 13/84  lung]
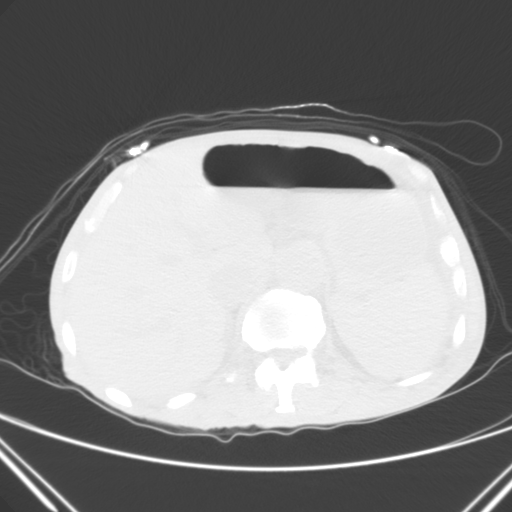
[im 19/84  lung]
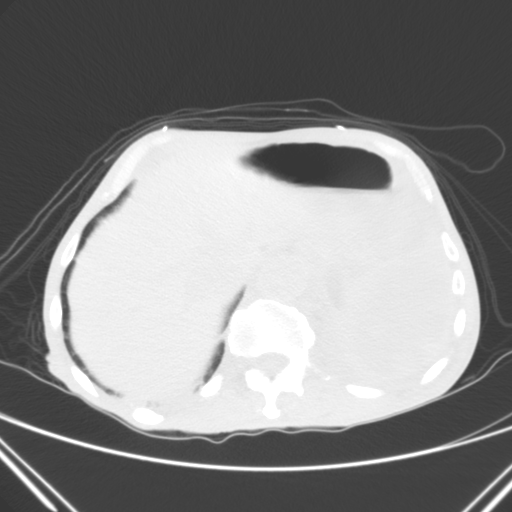
[im 25/84  lung]
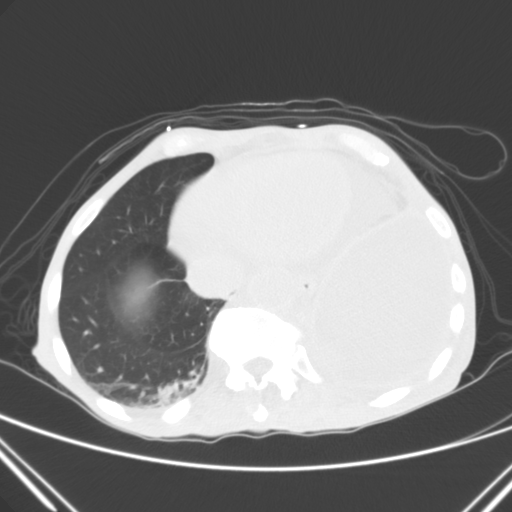
[im 31/84  mediastinal]
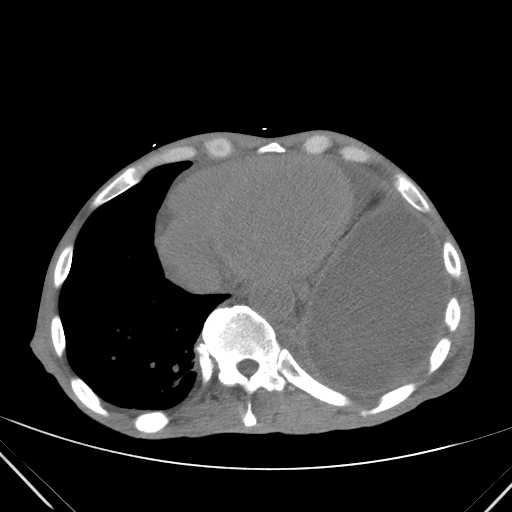
[im 31/84  lung]
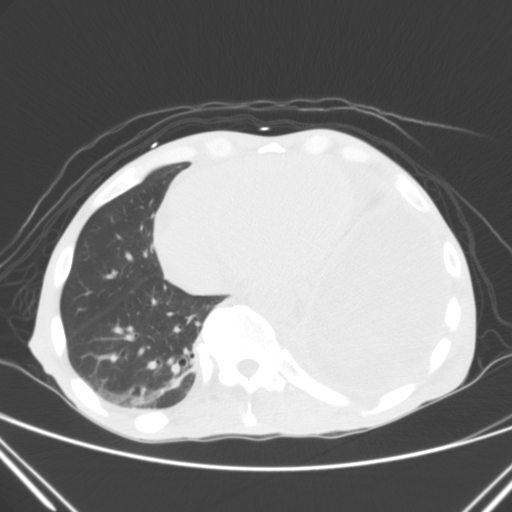
[im 37/84  lung]
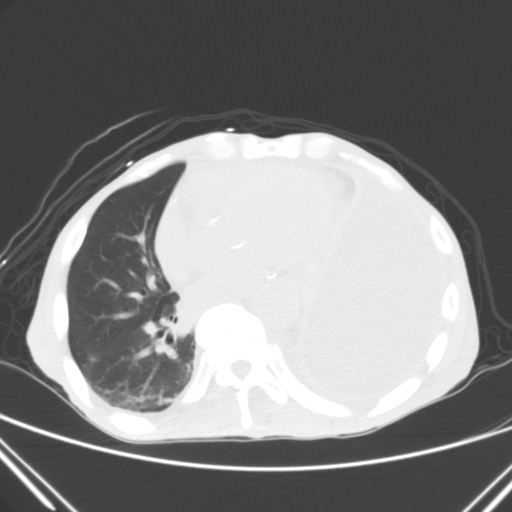
[im 47/84  lung]
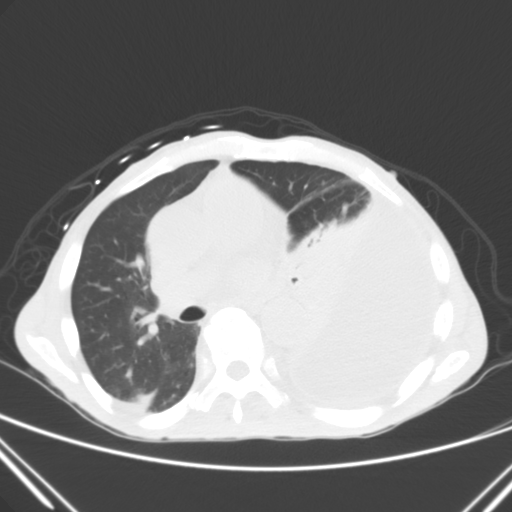
[im 53/84  lung]
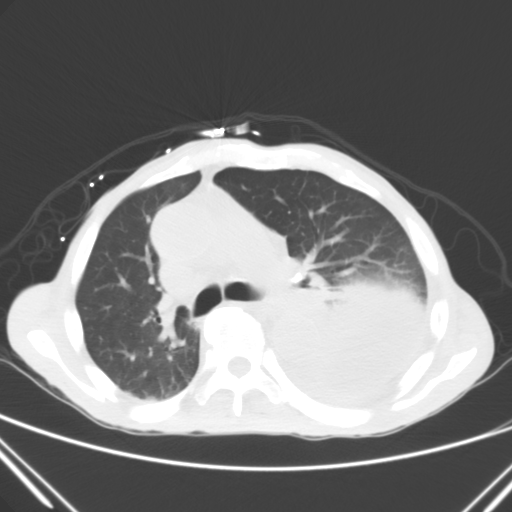
[im 59/84  mediastinal]
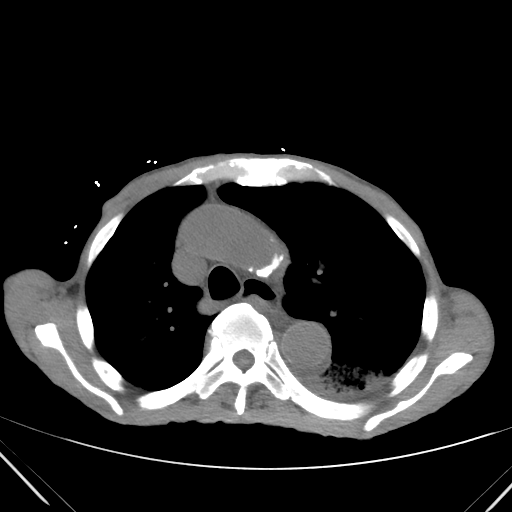
[im 59/84  lung]
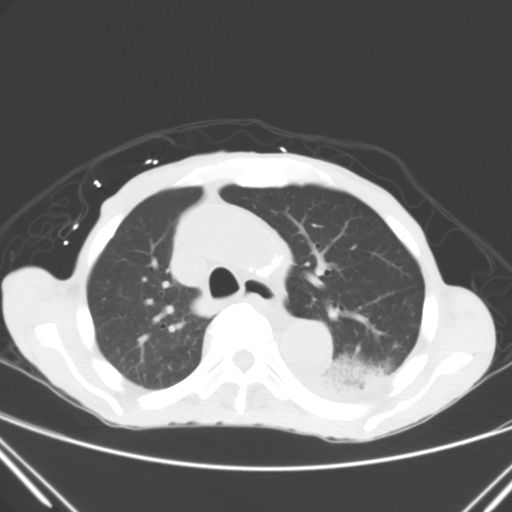
[im 65/84  lung]
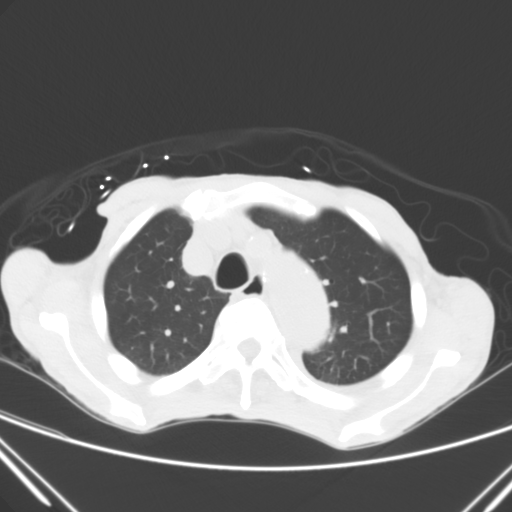
[im 71/84  lung]
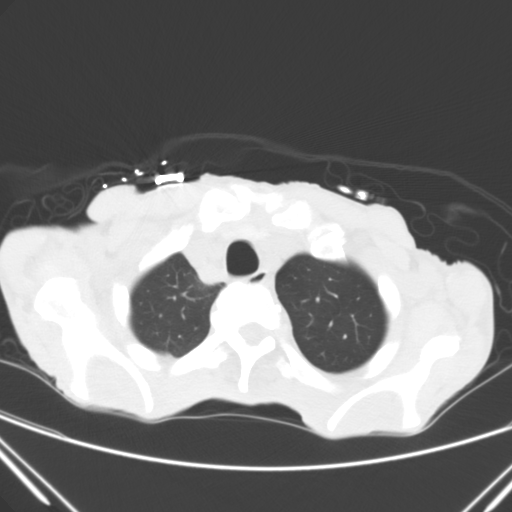
[im 77/84  lung]
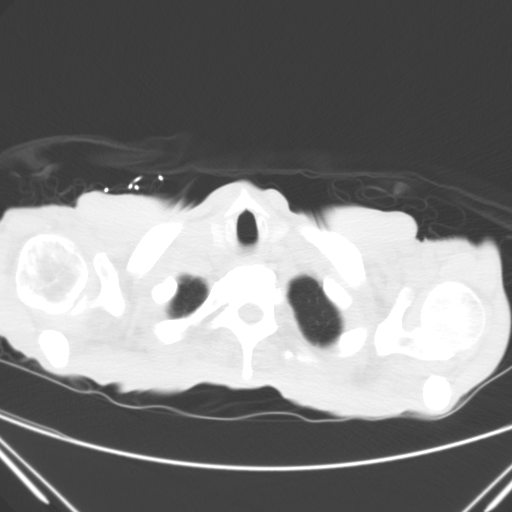

[mpr, coronal, coronal · coronal · 0.81mm/px · 3 of 78 slices shown]
[im 16/78  lung]
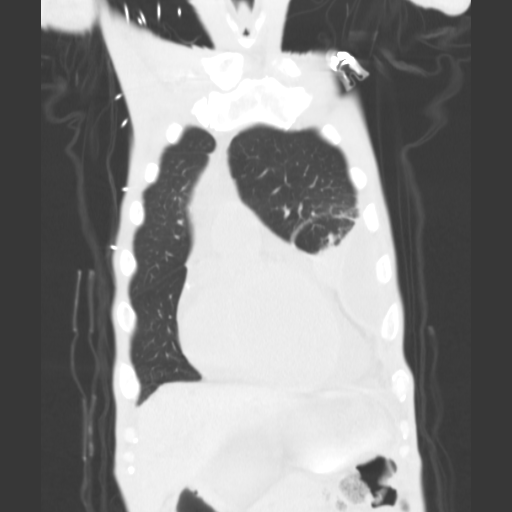
[im 31/78  lung]
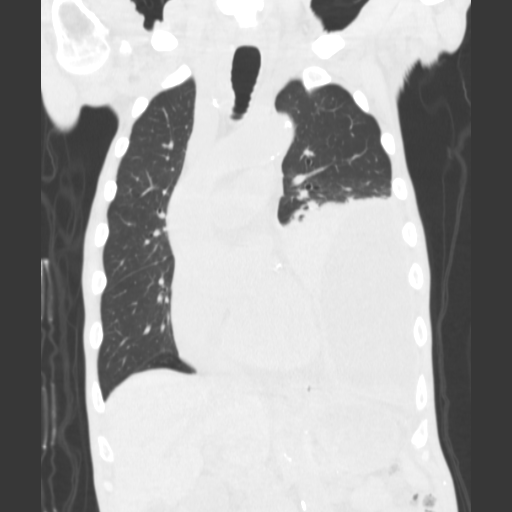
[im 47/78  lung]
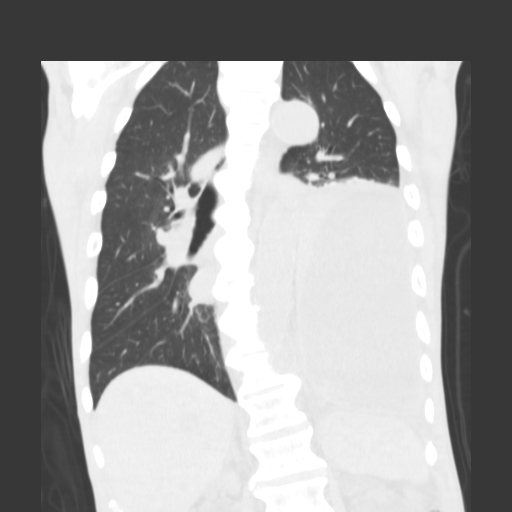

[15 of 36 positions shown; findings below may reference images not displayed]

FINDINGS: Interval moderate to large-sized loculated pleural effusion in the
left mid and lower lung zones. There is also a suggestion of a
faintly visible pleural based mass inferiorly and medially,
measuring 2.5 x 1.9 cm on image number 59. There is also a probable
2nd pleural based mass medially and inferiorly, measuring 2.8 x
cm on image number 64. Visualization of these masses is somewhat
limited due to the lack of intravenous contrast. There is
compressive atelectasis and some parenchymal calcification medial to
the pleural fluid as well as deviation of the heart to the right.
There is also patchy airspace opacity in the adjacent superior
segment of the left lower lobe.

Dense proximal coronary artery calcifications are noted. Also noted
is mild right lower lobe atelectasis. No enlarged lymph nodes are
seen. Again demonstrated are bilateral renal cysts. These include
extensive bilateral parapelvic cysts and possible chronic
hydronephrosis. There are also small gallstones in the gallbladder,
the largest measuring 3 mm in maximum diameter. Degenerative changes
in the thoracic and cervical spine with changes of DISH. Mild to
moderate dextro convex scoliosis.
IMPRESSION: 1. Moderate to large-sized loculated left pleural fluid with
possible pleural-based masses. These findings are concerning for a
loculated malignant pleural effusion. Further evaluation with
thoracentesis is recommended. The loculated pleural fluid is causing
deviation of the heart to the right.
2. Probable pneumonia in the superior segment of the left lower
lobe.
3. Bilateral lower lobe atelectasis, greater on the left.
4. Dense proximal coronary artery calcifications.
5. Cholelithiasis.
6. Chronic bilateral renal parapelvic cysts and possible UPJ
obstructions.

## 2015-10-03 IMAGING — CR DG CHEST DECUBITUS*L*
1 series · 1 of 1 positions shown · non-contrast
Comparison: Prior chest x-ray 01/29/2013

CLINICAL DATA: Evaluate for layering effusion

EXAM:
CHEST - LEFT DECUBITUS

[w chest decub.]
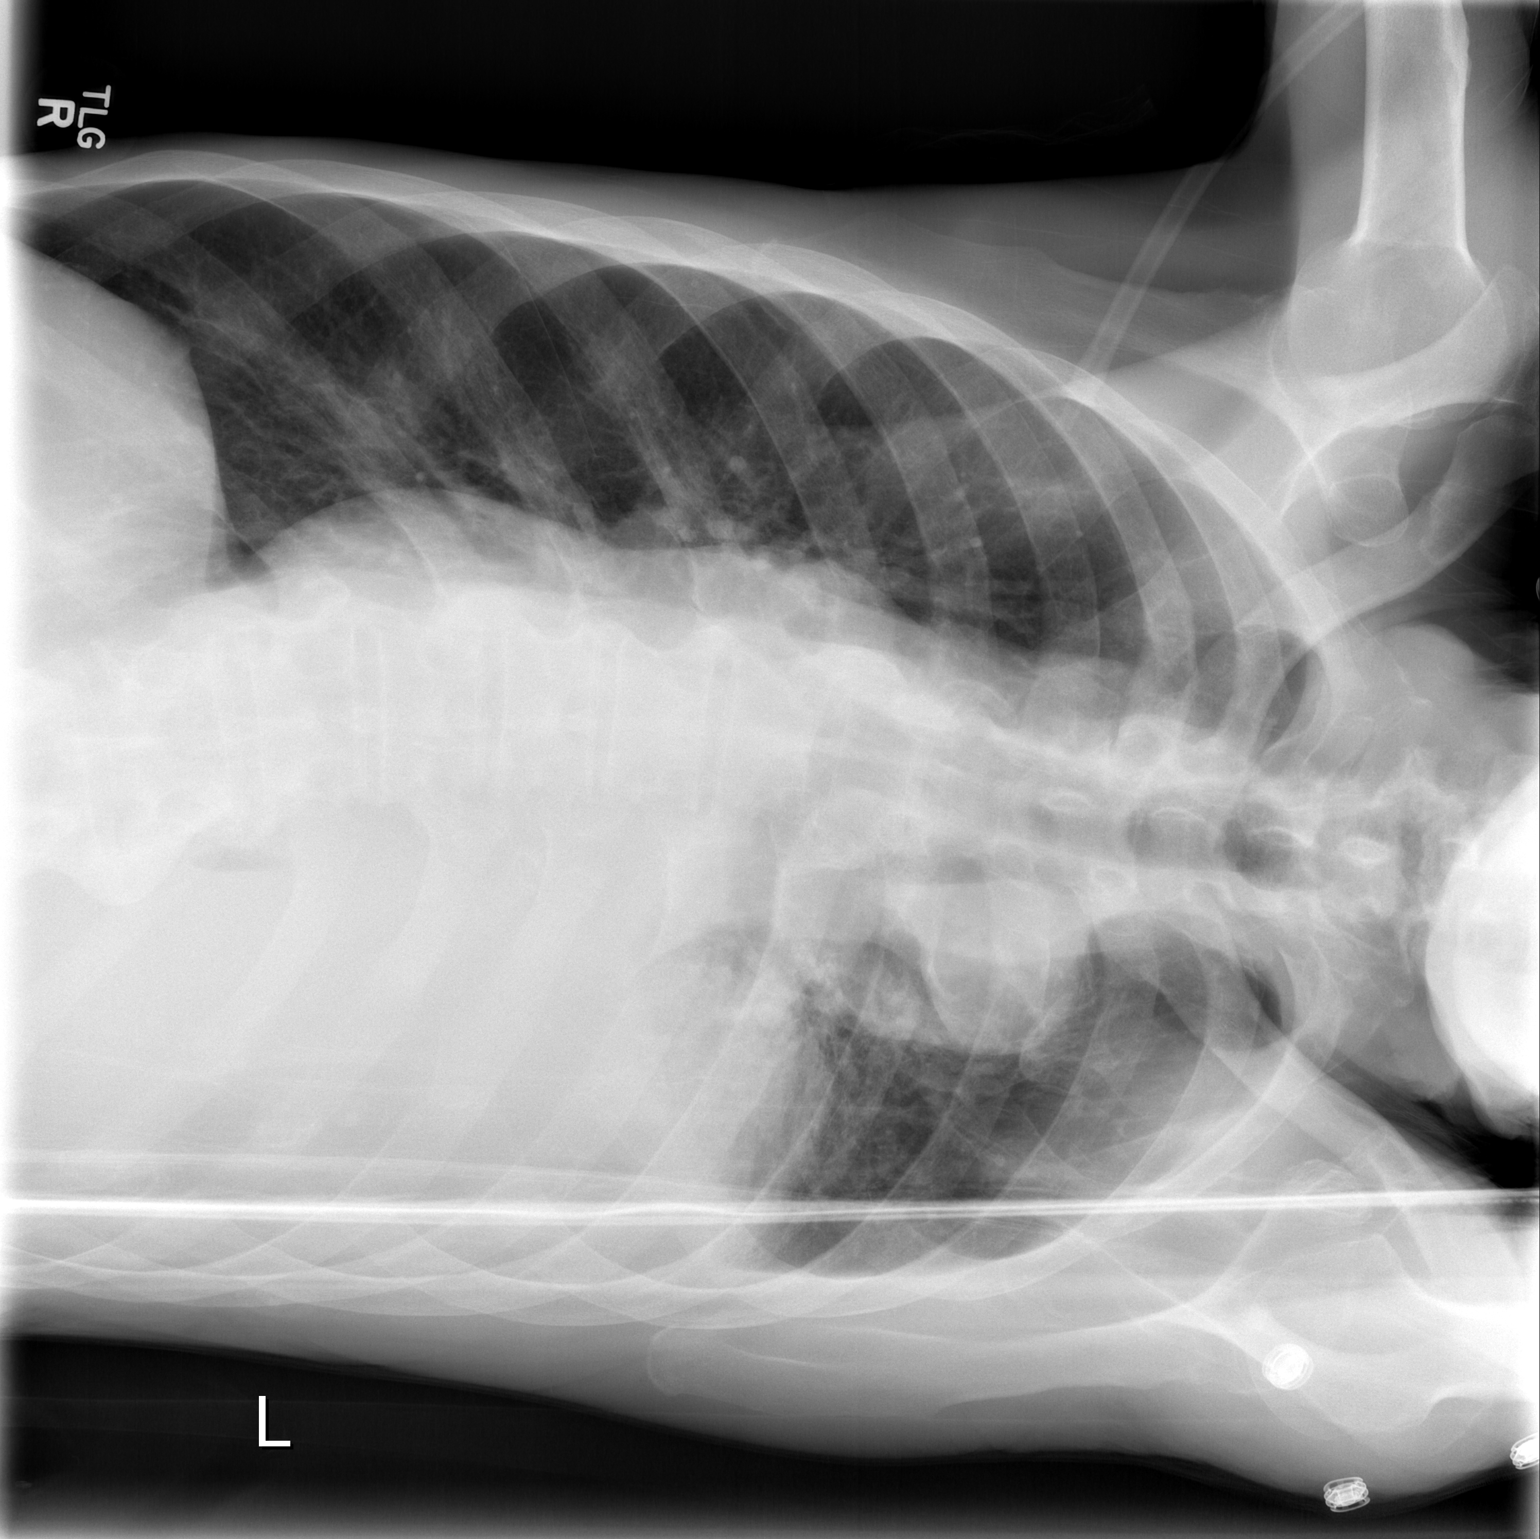

[1 of 1 positions shown; findings below may reference images not displayed]

FINDINGS: No evidence of layering pleural of fluid. Otherwise, no significant
interval change in the appearance of the chest compared to earlier
today.
IMPRESSION: No layering fluid. The left basilar opacity may reflect a loculated
pleural effusion, dense consolidation, or potentially an underlying
mass. Recommend further evaluation with contrasted CT scan of the
chest.
